# Patient Record
Sex: Female | Born: 1935
Health system: Southern US, Community
[De-identification: ages and names within clinical notes are randomized; demographics above are authoritative.]

## PROBLEM LIST (undated history)

## (undated) DIAGNOSIS — N189 Chronic kidney disease, unspecified: Principal | ICD-10-CM

## (undated) DIAGNOSIS — H269 Unspecified cataract: Secondary | ICD-10-CM

## (undated) DIAGNOSIS — D509 Iron deficiency anemia, unspecified: Secondary | ICD-10-CM

## (undated) DIAGNOSIS — D631 Anemia in chronic kidney disease: Secondary | ICD-10-CM

## (undated) HISTORY — DX: Chronic kidney disease, unspecified: N18.9

## (undated) HISTORY — DX: Anemia in chronic kidney disease: D63.1

## (undated) HISTORY — DX: Unspecified cataract: H26.9

## (undated) HISTORY — PX: APPENDECTOMY: SHX54

## (undated) HISTORY — PX: ABDOMINAL HYSTERECTOMY: SHX81

## (undated) HISTORY — DX: Iron deficiency anemia, unspecified: D50.9

---

## 1998-06-11 ENCOUNTER — Other Ambulatory Visit: Admission: RE | Admit: 1998-06-11 | Discharge: 1998-06-11 | Payer: Self-pay | Admitting: Family Medicine

## 1998-06-18 ENCOUNTER — Ambulatory Visit (HOSPITAL_COMMUNITY): Admission: RE | Admit: 1998-06-18 | Discharge: 1998-06-18 | Payer: Self-pay | Admitting: Family Medicine

## 1998-07-02 ENCOUNTER — Ambulatory Visit (HOSPITAL_COMMUNITY): Admission: RE | Admit: 1998-07-02 | Discharge: 1998-07-02 | Payer: Self-pay | Admitting: Family Medicine

## 1998-09-27 ENCOUNTER — Ambulatory Visit (HOSPITAL_COMMUNITY): Admission: RE | Admit: 1998-09-27 | Discharge: 1998-09-27 | Payer: Self-pay | Admitting: Family Medicine

## 1998-09-27 ENCOUNTER — Encounter: Payer: Self-pay | Admitting: Family Medicine

## 1999-06-11 ENCOUNTER — Ambulatory Visit (HOSPITAL_COMMUNITY): Admission: RE | Admit: 1999-06-11 | Discharge: 1999-06-11 | Payer: Self-pay | Admitting: Family Medicine

## 1999-06-11 ENCOUNTER — Encounter: Payer: Self-pay | Admitting: Family Medicine

## 1999-08-21 ENCOUNTER — Other Ambulatory Visit: Admission: RE | Admit: 1999-08-21 | Discharge: 1999-08-21 | Payer: Self-pay | Admitting: Family Medicine

## 2000-09-22 ENCOUNTER — Ambulatory Visit (HOSPITAL_COMMUNITY): Admission: RE | Admit: 2000-09-22 | Discharge: 2000-09-22 | Payer: Self-pay | Admitting: Family Medicine

## 2001-06-16 ENCOUNTER — Encounter: Payer: Self-pay | Admitting: Family Medicine

## 2001-06-16 ENCOUNTER — Ambulatory Visit (HOSPITAL_COMMUNITY): Admission: RE | Admit: 2001-06-16 | Discharge: 2001-06-16 | Payer: Self-pay | Admitting: Family Medicine

## 2002-02-21 ENCOUNTER — Encounter (INDEPENDENT_AMBULATORY_CARE_PROVIDER_SITE_OTHER): Payer: Self-pay | Admitting: Specialist

## 2002-02-21 ENCOUNTER — Ambulatory Visit (HOSPITAL_COMMUNITY): Admission: RE | Admit: 2002-02-21 | Discharge: 2002-02-21 | Payer: Self-pay | Admitting: Hematology & Oncology

## 2002-02-28 ENCOUNTER — Other Ambulatory Visit: Admission: RE | Admit: 2002-02-28 | Discharge: 2002-02-28 | Payer: Self-pay | Admitting: Family Medicine

## 2003-03-12 ENCOUNTER — Encounter: Payer: Self-pay | Admitting: Family Medicine

## 2003-03-12 ENCOUNTER — Ambulatory Visit (HOSPITAL_COMMUNITY): Admission: RE | Admit: 2003-03-12 | Discharge: 2003-03-12 | Payer: Self-pay | Admitting: Family Medicine

## 2003-06-12 ENCOUNTER — Encounter: Payer: Self-pay | Admitting: Family Medicine

## 2003-06-12 ENCOUNTER — Ambulatory Visit (HOSPITAL_COMMUNITY): Admission: RE | Admit: 2003-06-12 | Discharge: 2003-06-12 | Payer: Self-pay | Admitting: Family Medicine

## 2004-01-06 ENCOUNTER — Ambulatory Visit (HOSPITAL_COMMUNITY): Admission: RE | Admit: 2004-01-06 | Discharge: 2004-01-06 | Payer: Self-pay | Admitting: Family Medicine

## 2004-01-11 ENCOUNTER — Ambulatory Visit (HOSPITAL_COMMUNITY): Admission: RE | Admit: 2004-01-11 | Discharge: 2004-01-11 | Payer: Self-pay | Admitting: Nurse Practitioner

## 2004-02-05 ENCOUNTER — Ambulatory Visit (HOSPITAL_COMMUNITY): Admission: RE | Admit: 2004-02-05 | Discharge: 2004-02-05 | Payer: Self-pay | Admitting: Family Medicine

## 2004-03-26 ENCOUNTER — Ambulatory Visit (HOSPITAL_COMMUNITY): Admission: RE | Admit: 2004-03-26 | Discharge: 2004-03-26 | Payer: Self-pay | Admitting: Family Medicine

## 2004-09-26 ENCOUNTER — Ambulatory Visit: Payer: Self-pay | Admitting: Family Medicine

## 2004-10-06 ENCOUNTER — Ambulatory Visit: Payer: Self-pay | Admitting: Family Medicine

## 2004-12-26 ENCOUNTER — Ambulatory Visit: Payer: Self-pay | Admitting: Hematology & Oncology

## 2005-02-19 ENCOUNTER — Ambulatory Visit: Payer: Self-pay | Admitting: Family Medicine

## 2005-05-26 ENCOUNTER — Ambulatory Visit: Payer: Self-pay | Admitting: Family Medicine

## 2005-06-16 ENCOUNTER — Ambulatory Visit: Payer: Self-pay | Admitting: Family Medicine

## 2005-06-25 ENCOUNTER — Ambulatory Visit: Payer: Self-pay | Admitting: Hematology & Oncology

## 2005-07-31 ENCOUNTER — Ambulatory Visit: Payer: Self-pay | Admitting: Family Medicine

## 2005-12-18 ENCOUNTER — Ambulatory Visit: Payer: Self-pay | Admitting: Family Medicine

## 2005-12-18 ENCOUNTER — Other Ambulatory Visit: Admission: RE | Admit: 2005-12-18 | Discharge: 2005-12-18 | Payer: Self-pay | Admitting: Family Medicine

## 2005-12-18 LAB — CONVERTED CEMR LAB: Pap Smear: NORMAL

## 2005-12-29 ENCOUNTER — Ambulatory Visit: Payer: Self-pay | Admitting: Hematology & Oncology

## 2006-01-28 ENCOUNTER — Ambulatory Visit: Payer: Self-pay | Admitting: Family Medicine

## 2006-04-12 ENCOUNTER — Ambulatory Visit: Payer: Self-pay | Admitting: Family Medicine

## 2006-04-13 ENCOUNTER — Ambulatory Visit (HOSPITAL_COMMUNITY): Admission: RE | Admit: 2006-04-13 | Discharge: 2006-04-13 | Payer: Self-pay | Admitting: Family Medicine

## 2006-06-25 ENCOUNTER — Ambulatory Visit: Payer: Self-pay | Admitting: Hematology & Oncology

## 2006-06-30 LAB — CBC WITH DIFFERENTIAL/PLATELET
BASO%: 0.5 % (ref 0.0–2.0)
EOS%: 3.2 % (ref 0.0–7.0)
MCH: 26.6 pg (ref 26.0–34.0)
MCHC: 32.6 g/dL (ref 32.0–36.0)
MCV: 81.8 fL (ref 81.0–101.0)
MONO%: 15.3 % — ABNORMAL HIGH (ref 0.0–13.0)
NEUT%: 40.5 % (ref 39.6–76.8)
RDW: 16.5 % — ABNORMAL HIGH (ref 11.3–14.5)
lymph#: 1.4 10*3/uL (ref 0.9–3.3)

## 2006-07-03 LAB — FERRITIN: Ferritin: 76 ng/mL (ref 10–291)

## 2006-07-03 LAB — DIRECT ANTIGLOBULIN TEST (NOT AT ARMC)
DAT (Complement): NEGATIVE
DAT IgG: POSITIVE

## 2006-10-14 ENCOUNTER — Ambulatory Visit: Payer: Self-pay | Admitting: Family Medicine

## 2006-12-24 ENCOUNTER — Ambulatory Visit: Payer: Self-pay | Admitting: Hematology & Oncology

## 2006-12-29 LAB — CBC & DIFF AND RETIC
EOS%: 2.9 % (ref 0.0–7.0)
IRF: 0.36 — ABNORMAL HIGH (ref 0.130–0.330)
MCH: 26.8 pg (ref 26.0–34.0)
MCV: 82.8 fL (ref 81.0–101.0)
MONO%: 11 % (ref 0.0–13.0)
NEUT#: 1.7 10*3/uL (ref 1.5–6.5)
RBC: 4.09 10*6/uL (ref 3.70–5.32)
RDW: 16.5 % — ABNORMAL HIGH (ref 11.3–14.5)
RETIC #: 29.9 10*3/uL (ref 19.7–115.1)
Retic %: 0.7 % (ref 0.4–2.3)
lymph#: 1.8 10*3/uL (ref 0.9–3.3)

## 2006-12-29 LAB — IRON AND TIBC: %SAT: 20 % (ref 20–55)

## 2007-03-25 ENCOUNTER — Inpatient Hospital Stay (HOSPITAL_BASED_OUTPATIENT_CLINIC_OR_DEPARTMENT_OTHER): Admission: RE | Admit: 2007-03-25 | Discharge: 2007-03-25 | Payer: Self-pay | Admitting: Cardiology

## 2007-06-27 ENCOUNTER — Ambulatory Visit: Payer: Self-pay | Admitting: Hematology & Oncology

## 2007-06-29 LAB — CBC WITH DIFFERENTIAL/PLATELET
BASO%: 0.8 % (ref 0.0–2.0)
Basophils Absolute: 0 10*3/uL (ref 0.0–0.1)
Eosinophils Absolute: 0.1 10*3/uL (ref 0.0–0.5)
HCT: 30.4 % — ABNORMAL LOW (ref 34.8–46.6)
HGB: 10 g/dL — ABNORMAL LOW (ref 11.6–15.9)
MONO#: 0.6 10*3/uL (ref 0.1–0.9)
NEUT#: 1.5 10*3/uL (ref 1.5–6.5)
NEUT%: 37.7 % — ABNORMAL LOW (ref 39.6–76.8)
WBC: 4.1 10*3/uL (ref 3.9–10.0)
lymph#: 1.8 10*3/uL (ref 0.9–3.3)

## 2007-07-20 ENCOUNTER — Encounter (INDEPENDENT_AMBULATORY_CARE_PROVIDER_SITE_OTHER): Payer: Self-pay | Admitting: Family Medicine

## 2007-07-20 DIAGNOSIS — M949 Disorder of cartilage, unspecified: Secondary | ICD-10-CM

## 2007-07-20 DIAGNOSIS — J309 Allergic rhinitis, unspecified: Secondary | ICD-10-CM | POA: Insufficient documentation

## 2007-07-20 DIAGNOSIS — M899 Disorder of bone, unspecified: Secondary | ICD-10-CM | POA: Insufficient documentation

## 2007-07-20 DIAGNOSIS — IMO0002 Reserved for concepts with insufficient information to code with codable children: Secondary | ICD-10-CM

## 2007-07-20 DIAGNOSIS — D649 Anemia, unspecified: Secondary | ICD-10-CM | POA: Insufficient documentation

## 2007-07-20 DIAGNOSIS — M26609 Unspecified temporomandibular joint disorder, unspecified side: Secondary | ICD-10-CM | POA: Insufficient documentation

## 2007-07-20 DIAGNOSIS — D61818 Other pancytopenia: Secondary | ICD-10-CM | POA: Insufficient documentation

## 2007-07-20 DIAGNOSIS — E079 Disorder of thyroid, unspecified: Secondary | ICD-10-CM | POA: Insufficient documentation

## 2007-07-20 DIAGNOSIS — I10 Essential (primary) hypertension: Secondary | ICD-10-CM | POA: Insufficient documentation

## 2007-08-03 LAB — CBC WITH DIFFERENTIAL/PLATELET
Eosinophils Absolute: 0.1 10*3/uL (ref 0.0–0.5)
HCT: 33.8 % — ABNORMAL LOW (ref 34.8–46.6)
LYMPH%: 40.4 % (ref 14.0–48.0)
MCHC: 33.4 g/dL (ref 32.0–36.0)
MCV: 82.9 fL (ref 81.0–101.0)
MONO#: 0.5 10*3/uL (ref 0.1–0.9)
MONO%: 10.7 % (ref 0.0–13.0)
NEUT#: 2 10*3/uL (ref 1.5–6.5)
NEUT%: 45.3 % (ref 39.6–76.8)
Platelets: 187 10*3/uL (ref 145–400)
RBC: 4.07 10*6/uL (ref 3.70–5.32)
WBC: 4.5 10*3/uL (ref 3.9–10.0)

## 2007-09-05 ENCOUNTER — Ambulatory Visit: Payer: Self-pay | Admitting: Hematology & Oncology

## 2007-09-07 LAB — CBC & DIFF AND RETIC
Basophils Absolute: 0 10*3/uL (ref 0.0–0.1)
Eosinophils Absolute: 0.1 10*3/uL (ref 0.0–0.5)
HGB: 10.7 g/dL — ABNORMAL LOW (ref 11.6–15.9)
IRF: 0.38 — ABNORMAL HIGH (ref 0.130–0.330)
MCV: 83.9 fL (ref 81.0–101.0)
MONO#: 0.4 10*3/uL (ref 0.1–0.9)
MONO%: 12.1 % (ref 0.0–13.0)
NEUT#: 1.6 10*3/uL (ref 1.5–6.5)
RBC: 3.78 10*6/uL (ref 3.70–5.32)
RDW: 17.9 % — ABNORMAL HIGH (ref 11.3–14.5)
RETIC #: 36.3 10*3/uL (ref 19.7–115.1)
Retic %: 1 % (ref 0.4–2.3)
WBC: 3.5 10*3/uL — ABNORMAL LOW (ref 3.9–10.0)

## 2007-09-07 LAB — FERRITIN: Ferritin: 850 ng/mL — ABNORMAL HIGH (ref 10–291)

## 2007-09-29 LAB — CBC WITH DIFFERENTIAL/PLATELET
BASO%: 0.3 % (ref 0.0–2.0)
Eosinophils Absolute: 0.1 10*3/uL (ref 0.0–0.5)
LYMPH%: 46 % (ref 14.0–48.0)
MCHC: 33.2 g/dL (ref 32.0–36.0)
MONO#: 0.4 10*3/uL (ref 0.1–0.9)
NEUT#: 1.6 10*3/uL (ref 1.5–6.5)
Platelets: 195 10*3/uL (ref 145–400)
RBC: 4.11 10*6/uL (ref 3.70–5.32)
RDW: 17.3 % — ABNORMAL HIGH (ref 11.3–14.5)
WBC: 3.9 10*3/uL (ref 3.9–10.0)
lymph#: 1.8 10*3/uL (ref 0.9–3.3)

## 2007-10-19 ENCOUNTER — Ambulatory Visit: Payer: Self-pay | Admitting: Hematology & Oncology

## 2007-10-19 LAB — CBC & DIFF AND RETIC
BASO%: 0.6 % (ref 0.0–2.0)
Eosinophils Absolute: 0.1 10*3/uL (ref 0.0–0.5)
HCT: 37.5 % (ref 34.8–46.6)
IRF: 0.25 (ref 0.130–0.330)
LYMPH%: 47.4 % (ref 14.0–48.0)
MCHC: 33.3 g/dL (ref 32.0–36.0)
MONO#: 0.3 10*3/uL (ref 0.1–0.9)
NEUT#: 1.3 10*3/uL — ABNORMAL LOW (ref 1.5–6.5)
NEUT%: 37.5 % — ABNORMAL LOW (ref 39.6–76.8)
Platelets: 187 10*3/uL (ref 145–400)
Retic %: 0.4 % (ref 0.4–2.3)
WBC: 3.4 10*3/uL — ABNORMAL LOW (ref 3.9–10.0)
lymph#: 1.6 10*3/uL (ref 0.9–3.3)

## 2007-10-21 LAB — TRANSFERRIN RECEPTOR, SOLUABLE: Transferrin Receptor, Soluble: 55.5 nmol/L

## 2007-10-21 LAB — FERRITIN: Ferritin: 441 ng/mL — ABNORMAL HIGH (ref 10–291)

## 2007-11-09 LAB — CBC WITH DIFFERENTIAL/PLATELET
Basophils Absolute: 0.1 10*3/uL (ref 0.0–0.1)
EOS%: 3.7 % (ref 0.0–7.0)
HCT: 35.3 % (ref 34.8–46.6)
HGB: 11.6 g/dL (ref 11.6–15.9)
LYMPH%: 32.6 % (ref 14.0–48.0)
MCH: 27.8 pg (ref 26.0–34.0)
MCV: 84.2 fL (ref 81.0–101.0)
MONO%: 14 % — ABNORMAL HIGH (ref 0.0–13.0)
NEUT%: 48.3 % (ref 39.6–76.8)
Platelets: 177 10*3/uL (ref 145–400)
RDW: 16.4 % — ABNORMAL HIGH (ref 11.3–14.5)

## 2007-11-29 ENCOUNTER — Ambulatory Visit: Payer: Self-pay | Admitting: Hematology & Oncology

## 2007-12-05 LAB — CBC & DIFF AND RETIC
BASO%: 1 % (ref 0.0–2.0)
LYMPH%: 43.3 % (ref 14.0–48.0)
MCH: 27.8 pg (ref 26.0–34.0)
MCHC: 33.5 g/dL (ref 32.0–36.0)
MCV: 82.9 fL (ref 81.0–101.0)
MONO%: 14.2 % — ABNORMAL HIGH (ref 0.0–13.0)
NEUT%: 37.2 % — ABNORMAL LOW (ref 39.6–76.8)
Platelets: 179 10*3/uL (ref 145–400)
RBC: 3.99 10*6/uL (ref 3.70–5.32)
Retic %: 0.5 % (ref 0.4–2.3)

## 2008-01-02 LAB — CBC & DIFF AND RETIC
EOS%: 3.5 % (ref 0.0–7.0)
MCH: 27.7 pg (ref 26.0–34.0)
MCHC: 33.5 g/dL (ref 32.0–36.0)
MCV: 82.7 fL (ref 81.0–101.0)
MONO%: 14.9 % — ABNORMAL HIGH (ref 0.0–13.0)
NEUT#: 2.1 10*3/uL (ref 1.5–6.5)
RBC: 4.42 10*6/uL (ref 3.70–5.32)
RDW: 17.7 % — ABNORMAL HIGH (ref 11.3–14.5)
RETIC #: 17.7 10*3/uL — ABNORMAL LOW (ref 19.7–115.1)
Retic %: 0.4 % (ref 0.4–2.3)

## 2008-01-02 LAB — CHCC SMEAR

## 2008-01-13 ENCOUNTER — Encounter: Admission: RE | Admit: 2008-01-13 | Discharge: 2008-01-13 | Payer: Self-pay | Admitting: Orthopedic Surgery

## 2008-01-30 ENCOUNTER — Ambulatory Visit: Payer: Self-pay | Admitting: Hematology & Oncology

## 2008-02-01 LAB — CBC WITH DIFFERENTIAL/PLATELET
Basophils Absolute: 0 10*3/uL (ref 0.0–0.1)
EOS%: 7.7 % — ABNORMAL HIGH (ref 0.0–7.0)
HCT: 32.8 % — ABNORMAL LOW (ref 34.8–46.6)
HGB: 10.9 g/dL — ABNORMAL LOW (ref 11.6–15.9)
LYMPH%: 33.8 % (ref 14.0–48.0)
MCH: 27.7 pg (ref 26.0–34.0)
MCV: 83.4 fL (ref 81.0–101.0)
MONO%: 10.8 % (ref 0.0–13.0)
NEUT%: 47.4 % (ref 39.6–76.8)
Platelets: 184 10*3/uL (ref 145–400)
lymph#: 1.8 10*3/uL (ref 0.9–3.3)

## 2008-02-01 LAB — CHCC SMEAR

## 2008-02-01 LAB — FERRITIN: Ferritin: 708 ng/mL — ABNORMAL HIGH (ref 10–291)

## 2008-03-08 ENCOUNTER — Encounter: Admission: RE | Admit: 2008-03-08 | Discharge: 2008-03-08 | Payer: Self-pay | Admitting: Orthopedic Surgery

## 2008-03-12 ENCOUNTER — Encounter: Admission: RE | Admit: 2008-03-12 | Discharge: 2008-04-28 | Payer: Self-pay | Admitting: Orthopedic Surgery

## 2008-03-26 ENCOUNTER — Ambulatory Visit: Payer: Self-pay | Admitting: Hematology & Oncology

## 2008-03-28 LAB — CBC & DIFF AND RETIC
BASO%: 0 % (ref 0.0–2.0)
EOS%: 6.6 % (ref 0.0–7.0)
HCT: 39.5 % (ref 34.8–46.6)
LYMPH%: 35.6 % (ref 14.0–48.0)
MCH: 28 pg (ref 26.0–34.0)
MCHC: 33.2 g/dL (ref 32.0–36.0)
MONO%: 9.7 % (ref 0.0–13.0)
NEUT%: 48.1 % (ref 39.6–76.8)
Platelets: 161 10*3/uL (ref 145–400)
RBC: 4.68 10*6/uL (ref 3.70–5.32)
Retic %: 0.1 % — ABNORMAL LOW (ref 0.4–2.3)

## 2008-04-25 LAB — CBC WITH DIFFERENTIAL/PLATELET
Basophils Absolute: 0 10*3/uL (ref 0.0–0.1)
EOS%: 7.4 % — ABNORMAL HIGH (ref 0.0–7.0)
Eosinophils Absolute: 0.4 10*3/uL (ref 0.0–0.5)
HGB: 11.4 g/dL — ABNORMAL LOW (ref 11.6–15.9)
LYMPH%: 41.7 % (ref 14.0–48.0)
MCH: 27.4 pg (ref 26.0–34.0)
MCV: 84.2 fL (ref 81.0–101.0)
MONO%: 12.2 % (ref 0.0–13.0)
NEUT#: 1.8 10*3/uL (ref 1.5–6.5)
Platelets: 162 10*3/uL (ref 145–400)
RBC: 4.14 10*6/uL (ref 3.70–5.32)
RDW: 17.2 % — ABNORMAL HIGH (ref 11.3–14.5)

## 2008-05-21 ENCOUNTER — Ambulatory Visit: Payer: Self-pay | Admitting: Hematology & Oncology

## 2008-05-23 LAB — CBC WITH DIFFERENTIAL/PLATELET
Basophils Absolute: 0 10*3/uL (ref 0.0–0.1)
EOS%: 4.8 % (ref 0.0–7.0)
Eosinophils Absolute: 0.2 10*3/uL (ref 0.0–0.5)
HCT: 37.2 % (ref 34.8–46.6)
HGB: 12.3 g/dL (ref 11.6–15.9)
MCH: 27.5 pg (ref 26.0–34.0)
MCV: 83.3 fL (ref 81.0–101.0)
MONO%: 13.7 % — ABNORMAL HIGH (ref 0.0–13.0)
NEUT#: 1.7 10*3/uL (ref 1.5–6.5)
NEUT%: 40.8 % (ref 39.6–76.8)
Platelets: 196 10*3/uL (ref 145–400)
RDW: 17 % — ABNORMAL HIGH (ref 11.3–14.5)

## 2008-06-19 ENCOUNTER — Ambulatory Visit: Payer: Self-pay | Admitting: Hematology & Oncology

## 2008-08-14 ENCOUNTER — Ambulatory Visit: Payer: Self-pay | Admitting: Hematology & Oncology

## 2008-08-15 LAB — CBC WITH DIFFERENTIAL (CANCER CENTER ONLY)
BASO#: 0 10*3/uL (ref 0.0–0.2)
BASO%: 0.9 % (ref 0.0–2.0)
EOS%: 4.8 % (ref 0.0–7.0)
Eosinophils Absolute: 0.2 10*3/uL (ref 0.0–0.5)
HCT: 35.2 % (ref 34.8–46.6)
HGB: 11.9 g/dL (ref 11.6–15.9)
LYMPH#: 1.6 10*3/uL (ref 0.9–3.3)
LYMPH%: 39.6 % (ref 14.0–48.0)
MCH: 27.9 pg (ref 26.0–34.0)
MCHC: 33.8 g/dL (ref 32.0–36.0)
MCV: 83 fL (ref 81–101)
MONO#: 0.6 10*3/uL (ref 0.1–0.9)
MONO%: 13.7 % — ABNORMAL HIGH (ref 0.0–13.0)
NEUT#: 1.7 10*3/uL (ref 1.5–6.5)
NEUT%: 41 % (ref 39.6–80.0)
Platelets: 225 10*3/uL (ref 145–400)
RBC: 4.27 10*6/uL (ref 3.70–5.32)
RDW: 14.7 % — ABNORMAL HIGH (ref 10.5–14.6)
WBC: 4.1 10*3/uL (ref 3.9–10.0)

## 2008-08-29 ENCOUNTER — Encounter: Admission: RE | Admit: 2008-08-29 | Discharge: 2008-08-29 | Payer: Self-pay | Admitting: Orthopedic Surgery

## 2008-09-12 LAB — CBC WITH DIFFERENTIAL (CANCER CENTER ONLY)
BASO#: 0 10*3/uL (ref 0.0–0.2)
EOS%: 5.9 % (ref 0.0–7.0)
Eosinophils Absolute: 0.2 10*3/uL (ref 0.0–0.5)
HGB: 10.7 g/dL — ABNORMAL LOW (ref 11.6–15.9)
LYMPH%: 39.4 % (ref 14.0–48.0)
MCH: 27.1 pg (ref 26.0–34.0)
MCHC: 33.3 g/dL (ref 32.0–36.0)
MCV: 81 fL (ref 81–101)
MONO%: 13.2 % — ABNORMAL HIGH (ref 0.0–13.0)
NEUT#: 1.5 10*3/uL (ref 1.5–6.5)
Platelets: 197 10*3/uL (ref 145–400)
RBC: 3.93 10*6/uL (ref 3.70–5.32)

## 2008-09-12 LAB — FERRITIN: Ferritin: 572 ng/mL — ABNORMAL HIGH (ref 10–291)

## 2008-09-12 LAB — CHCC SATELLITE - SMEAR

## 2008-09-12 LAB — RETICULOCYTES (CHCC)
RBC.: 4.02 MIL/uL (ref 3.87–5.11)
Retic Ct Pct: 0.7 % (ref 0.4–3.1)

## 2008-10-09 ENCOUNTER — Ambulatory Visit: Payer: Self-pay | Admitting: Hematology & Oncology

## 2008-10-11 LAB — CBC WITH DIFFERENTIAL (CANCER CENTER ONLY)
BASO#: 0 10*3/uL (ref 0.0–0.2)
EOS%: 0.9 % (ref 0.0–7.0)
Eosinophils Absolute: 0.1 10*3/uL (ref 0.0–0.5)
HCT: 33 % — ABNORMAL LOW (ref 34.8–46.6)
HGB: 11 g/dL — ABNORMAL LOW (ref 11.6–15.9)
MCH: 27.8 pg (ref 26.0–34.0)
MCHC: 33.3 g/dL (ref 32.0–36.0)
MONO%: 7 % (ref 0.0–13.0)
NEUT%: 73.4 % (ref 39.6–80.0)
RBC: 3.95 10*6/uL (ref 3.70–5.32)

## 2008-11-08 LAB — CBC WITH DIFFERENTIAL (CANCER CENTER ONLY)
BASO%: 0.5 % (ref 0.0–2.0)
EOS%: 4.6 % (ref 0.0–7.0)
HGB: 11.5 g/dL — ABNORMAL LOW (ref 11.6–15.9)
LYMPH#: 1.5 10*3/uL (ref 0.9–3.3)
MCH: 27.8 pg (ref 26.0–34.0)
MCHC: 33.5 g/dL (ref 32.0–36.0)
MONO%: 10.7 % (ref 0.0–13.0)
NEUT#: 2 10*3/uL (ref 1.5–6.5)
Platelets: 222 10*3/uL (ref 145–400)
RDW: 15.6 % — ABNORMAL HIGH (ref 10.5–14.6)

## 2008-12-04 ENCOUNTER — Ambulatory Visit: Payer: Self-pay | Admitting: Hematology & Oncology

## 2008-12-06 LAB — CBC WITH DIFFERENTIAL (CANCER CENTER ONLY)
BASO#: 0.1 10*3/uL (ref 0.0–0.2)
Eosinophils Absolute: 0.1 10*3/uL (ref 0.0–0.5)
HCT: 31.8 % — ABNORMAL LOW (ref 34.8–46.6)
HGB: 10.5 g/dL — ABNORMAL LOW (ref 11.6–15.9)
LYMPH#: 1.5 10*3/uL (ref 0.9–3.3)
MONO#: 1 10*3/uL — ABNORMAL HIGH (ref 0.1–0.9)
NEUT#: 7.2 10*3/uL — ABNORMAL HIGH (ref 1.5–6.5)
NEUT%: 73.3 % (ref 39.6–80.0)
RBC: 3.86 10*6/uL (ref 3.70–5.32)
WBC: 9.8 10*3/uL (ref 3.9–10.0)

## 2009-01-02 LAB — CBC WITH DIFFERENTIAL (CANCER CENTER ONLY)
BASO%: 0.5 % (ref 0.0–2.0)
HCT: 35.2 % (ref 34.8–46.6)
LYMPH#: 1.4 10*3/uL (ref 0.9–3.3)
MONO#: 0.3 10*3/uL (ref 0.1–0.9)
NEUT#: 1.7 10*3/uL (ref 1.5–6.5)
NEUT%: 47.1 % (ref 39.6–80.0)
Platelets: 197 10*3/uL (ref 145–400)
RDW: 16.5 % — ABNORMAL HIGH (ref 10.5–14.6)
WBC: 3.7 10*3/uL — ABNORMAL LOW (ref 3.9–10.0)

## 2009-01-04 LAB — FERRITIN: Ferritin: 531 ng/mL — ABNORMAL HIGH (ref 10–291)

## 2009-01-04 LAB — RETICULOCYTES (CHCC): Retic Ct Pct: 0.6 % (ref 0.4–3.1)

## 2009-01-29 ENCOUNTER — Ambulatory Visit: Payer: Self-pay | Admitting: Hematology & Oncology

## 2009-02-01 LAB — CBC WITH DIFFERENTIAL (CANCER CENTER ONLY)
BASO#: 0 10*3/uL (ref 0.0–0.2)
BASO%: 0.6 % (ref 0.0–2.0)
EOS%: 5.2 % (ref 0.0–7.0)
HCT: 38 % (ref 34.8–46.6)
LYMPH#: 1.5 10*3/uL (ref 0.9–3.3)
LYMPH%: 35 % (ref 14.0–48.0)
MCH: 28 pg (ref 26.0–34.0)
MCHC: 32.3 g/dL (ref 32.0–36.0)
MONO%: 10.2 % (ref 0.0–13.0)
NEUT%: 49 % (ref 39.6–80.0)
RDW: 14.4 % (ref 10.5–14.6)

## 2009-02-27 LAB — CBC WITH DIFFERENTIAL (CANCER CENTER ONLY)
BASO#: 0 10*3/uL (ref 0.0–0.2)
BASO%: 0.4 % (ref 0.0–2.0)
HCT: 37 % (ref 34.8–46.6)
HGB: 12 g/dL (ref 11.6–15.9)
LYMPH%: 30.7 % (ref 14.0–48.0)
MCHC: 32.4 g/dL (ref 32.0–36.0)
MCV: 87 fL (ref 81–101)
MONO#: 0.5 10*3/uL (ref 0.1–0.9)
NEUT%: 53.7 % (ref 39.6–80.0)
RBC: 4.25 10*6/uL (ref 3.70–5.32)
RDW: 14.2 % (ref 10.5–14.6)

## 2009-03-26 ENCOUNTER — Ambulatory Visit: Payer: Self-pay | Admitting: Hematology & Oncology

## 2009-03-29 LAB — CBC WITH DIFFERENTIAL (CANCER CENTER ONLY)
BASO#: 0.1 10*3/uL (ref 0.0–0.2)
BASO%: 0.7 % (ref 0.0–2.0)
EOS%: 0.8 % (ref 0.0–7.0)
LYMPH#: 1.4 10*3/uL (ref 0.9–3.3)
MCH: 28.1 pg (ref 26.0–34.0)
MCHC: 33.1 g/dL (ref 32.0–36.0)
MONO%: 7 % (ref 0.0–13.0)
NEUT#: 6 10*3/uL (ref 1.5–6.5)
Platelets: 256 10*3/uL (ref 145–400)
RDW: 14.4 % (ref 10.5–14.6)

## 2009-03-29 LAB — FERRITIN: Ferritin: 454 ng/mL — ABNORMAL HIGH (ref 10–291)

## 2009-04-29 ENCOUNTER — Ambulatory Visit: Payer: Self-pay | Admitting: Hematology & Oncology

## 2009-04-29 LAB — CBC WITH DIFFERENTIAL (CANCER CENTER ONLY)
BASO#: 0 10*3/uL (ref 0.0–0.2)
BASO%: 0.5 % (ref 0.0–2.0)
EOS%: 5.5 % (ref 0.0–7.0)
HCT: 33.1 % — ABNORMAL LOW (ref 34.8–46.6)
HGB: 11 g/dL — ABNORMAL LOW (ref 11.6–15.9)
LYMPH%: 35.5 % (ref 14.0–48.0)
MCH: 28.5 pg (ref 26.0–34.0)
MCHC: 33.4 g/dL (ref 32.0–36.0)
MONO%: 8.9 % (ref 0.0–13.0)
NEUT%: 49.6 % (ref 39.6–80.0)
RDW: 13.7 % (ref 10.5–14.6)

## 2009-04-29 LAB — RETICULOCYTES (CHCC)
ABS Retic: 39.5 10*3/uL (ref 19.0–186.0)
RBC.: 3.95 MIL/uL (ref 3.87–5.11)

## 2009-04-29 LAB — FERRITIN: Ferritin: 693 ng/mL — ABNORMAL HIGH (ref 10–291)

## 2009-05-31 LAB — CBC WITH DIFFERENTIAL (CANCER CENTER ONLY)
BASO#: 0 10*3/uL (ref 0.0–0.2)
EOS%: 7.2 % — ABNORMAL HIGH (ref 0.0–7.0)
Eosinophils Absolute: 0.3 10*3/uL (ref 0.0–0.5)
HCT: 37.6 % (ref 34.8–46.6)
HGB: 12 g/dL (ref 11.6–15.9)
LYMPH#: 1.5 10*3/uL (ref 0.9–3.3)
MONO#: 0.4 10*3/uL (ref 0.1–0.9)
NEUT#: 1.6 10*3/uL (ref 1.5–6.5)
NEUT%: 41.4 % (ref 39.6–80.0)
RBC: 4.43 10*6/uL (ref 3.70–5.32)
WBC: 3.9 10*3/uL (ref 3.9–10.0)

## 2009-06-04 ENCOUNTER — Ambulatory Visit: Payer: Self-pay | Admitting: Hematology & Oncology

## 2009-06-27 ENCOUNTER — Ambulatory Visit: Payer: Self-pay | Admitting: Hematology & Oncology

## 2009-06-27 LAB — CBC WITH DIFFERENTIAL/PLATELET
EOS%: 9.5 % — ABNORMAL HIGH (ref 0.0–7.0)
LYMPH%: 39.7 % (ref 14.0–49.7)
MCH: 27 pg (ref 25.1–34.0)
MCV: 83.4 fL (ref 79.5–101.0)
MONO%: 10.5 % (ref 0.0–14.0)
RBC: 4.15 10*6/uL (ref 3.70–5.45)
RDW: 15.8 % — ABNORMAL HIGH (ref 11.2–14.5)
nRBC: 0 % (ref 0–0)

## 2009-06-29 LAB — FERRITIN: Ferritin: 544 ng/mL — ABNORMAL HIGH (ref 10–291)

## 2009-07-05 ENCOUNTER — Ambulatory Visit: Payer: Self-pay | Admitting: Hematology & Oncology

## 2009-07-08 LAB — CBC WITH DIFFERENTIAL (CANCER CENTER ONLY)
BASO%: 1.1 % (ref 0.0–2.0)
EOS%: 3.3 % (ref 0.0–7.0)
LYMPH%: 41.1 % (ref 14.0–48.0)
MCHC: 32.5 g/dL (ref 32.0–36.0)
MCV: 84 fL (ref 81–101)
MONO#: 0.6 10*3/uL (ref 0.1–0.9)
Platelets: 271 10*3/uL (ref 145–400)
RDW: 15.6 % — ABNORMAL HIGH (ref 10.5–14.6)
WBC: 5 10*3/uL (ref 3.9–10.0)

## 2009-07-23 LAB — CBC WITH DIFFERENTIAL (CANCER CENTER ONLY)
BASO%: 0.4 % (ref 0.0–2.0)
EOS%: 5.5 % (ref 0.0–7.0)
HCT: 33.3 % — ABNORMAL LOW (ref 34.8–46.6)
LYMPH#: 1.3 10*3/uL (ref 0.9–3.3)
MCHC: 32.7 g/dL (ref 32.0–36.0)
MONO#: 0.4 10*3/uL (ref 0.1–0.9)
NEUT#: 2.3 10*3/uL (ref 1.5–6.5)
NEUT%: 54.1 % (ref 39.6–80.0)
Platelets: 191 10*3/uL (ref 145–400)
RDW: 15.3 % — ABNORMAL HIGH (ref 10.5–14.6)
WBC: 4.3 10*3/uL (ref 3.9–10.0)

## 2009-08-19 ENCOUNTER — Ambulatory Visit: Payer: Self-pay | Admitting: Hematology & Oncology

## 2009-09-09 LAB — CBC WITH DIFFERENTIAL (CANCER CENTER ONLY)
BASO%: 0.5 % (ref 0.0–2.0)
Eosinophils Absolute: 0.2 10*3/uL (ref 0.0–0.5)
LYMPH%: 36.3 % (ref 14.0–48.0)
MCH: 27.3 pg (ref 26.0–34.0)
MCV: 82 fL (ref 81–101)
MONO%: 9.5 % (ref 0.0–13.0)
NEUT%: 48.1 % (ref 39.6–80.0)
Platelets: 203 10*3/uL (ref 145–400)
RDW: 15.6 % — ABNORMAL HIGH (ref 10.5–14.6)

## 2009-09-09 LAB — RETICULOCYTES (CHCC)
ABS Retic: 24.1 10*3/uL (ref 19.0–186.0)
RBC.: 4.01 MIL/uL (ref 3.87–5.11)
Retic Ct Pct: 0.6 % (ref 0.4–3.1)

## 2009-12-25 ENCOUNTER — Ambulatory Visit: Payer: Self-pay | Admitting: Hematology & Oncology

## 2009-12-26 LAB — CBC WITH DIFFERENTIAL (CANCER CENTER ONLY)
BASO%: 0.7 % (ref 0.0–2.0)
EOS%: 5 % (ref 0.0–7.0)
HCT: 37.1 % (ref 34.8–46.6)
MCV: 84 fL (ref 81–101)
MONO#: 0.4 10*3/uL (ref 0.1–0.9)
NEUT#: 1.7 10*3/uL (ref 1.5–6.5)
NEUT%: 44 % (ref 39.6–80.0)
Platelets: 174 10*3/uL (ref 145–400)
WBC: 3.9 10*3/uL (ref 3.9–10.0)

## 2009-12-27 LAB — RETICULOCYTES (CHCC)
ABS Retic: 34.3 10*3/uL (ref 19.0–186.0)
RBC.: 4.29 MIL/uL (ref 3.87–5.11)

## 2009-12-27 LAB — DIRECT ANTIGLOBULIN TEST (NOT AT ARMC)
DAT (Complement): NEGATIVE
DAT IgG: NEGATIVE

## 2010-02-26 ENCOUNTER — Encounter: Admission: RE | Admit: 2010-02-26 | Discharge: 2010-02-26 | Payer: Self-pay | Admitting: Orthopedic Surgery

## 2010-04-22 ENCOUNTER — Ambulatory Visit: Payer: Self-pay | Admitting: Hematology & Oncology

## 2010-04-24 LAB — CBC WITH DIFFERENTIAL (CANCER CENTER ONLY)
BASO#: 0 10*3/uL (ref 0.0–0.2)
LYMPH%: 35.6 % (ref 14.0–48.0)
MCHC: 33.3 g/dL (ref 32.0–36.0)
MCV: 85 fL (ref 81–101)
MONO#: 0.3 10*3/uL (ref 0.1–0.9)
MONO%: 9.1 % (ref 0.0–13.0)
Platelets: 181 10*3/uL (ref 145–400)
RBC: 4.03 10*6/uL (ref 3.70–5.32)
WBC: 3.4 10*3/uL — ABNORMAL LOW (ref 3.9–10.0)

## 2010-04-24 LAB — RETICULOCYTES (CHCC)
ABS Retic: 32.8 10*3/uL (ref 19.0–186.0)
Retic Ct Pct: 0.8 % (ref 0.4–3.1)

## 2010-04-24 LAB — FERRITIN: Ferritin: 476 ng/mL — ABNORMAL HIGH (ref 10–291)

## 2010-06-23 ENCOUNTER — Ambulatory Visit: Payer: Self-pay | Admitting: Hematology & Oncology

## 2010-07-17 LAB — CBC WITH DIFFERENTIAL (CANCER CENTER ONLY)
BASO#: 0 10*3/uL (ref 0.0–0.2)
BASO%: 0.5 % (ref 0.0–2.0)
EOS%: 5.1 % (ref 0.0–7.0)
Eosinophils Absolute: 0.2 10*3/uL (ref 0.0–0.5)
LYMPH#: 1.5 10*3/uL (ref 0.9–3.3)
LYMPH%: 34.7 % (ref 14.0–48.0)
MCV: 84 fL (ref 81–101)
RBC: 3.92 10*6/uL (ref 3.70–5.32)

## 2010-07-17 LAB — CHCC SATELLITE - SMEAR

## 2010-07-17 LAB — RETICULOCYTES (CHCC)
ABS Retic: 39.3 10*3/uL (ref 19.0–186.0)
Retic Ct Pct: 1 % (ref 0.4–3.1)

## 2010-10-14 ENCOUNTER — Ambulatory Visit: Payer: Self-pay | Admitting: Hematology & Oncology

## 2010-10-17 LAB — CBC WITH DIFFERENTIAL (CANCER CENTER ONLY)
BASO%: 0.3 % (ref 0.0–2.0)
EOS%: 5.9 % (ref 0.0–7.0)
HCT: 34.2 % — ABNORMAL LOW (ref 34.8–46.6)
HGB: 11.3 g/dL — ABNORMAL LOW (ref 11.6–15.9)
LYMPH#: 1.1 10*3/uL (ref 0.9–3.3)
LYMPH%: 36.6 % (ref 14.0–48.0)
MCHC: 33 g/dL (ref 32.0–36.0)
NEUT%: 46.5 % (ref 39.6–80.0)
Platelets: 138 10*3/uL — ABNORMAL LOW (ref 145–400)
RBC: 4.08 10*6/uL (ref 3.70–5.32)

## 2010-10-17 LAB — RETICULOCYTES (CHCC)
ABS Retic: 28.6 10*3/uL (ref 19.0–186.0)
RBC.: 4.09 MIL/uL (ref 3.87–5.11)
Retic Ct Pct: 0.7 % (ref 0.4–3.1)

## 2010-12-24 ENCOUNTER — Ambulatory Visit (HOSPITAL_BASED_OUTPATIENT_CLINIC_OR_DEPARTMENT_OTHER): Payer: Medicare Other | Admitting: Hematology & Oncology

## 2010-12-25 LAB — CBC WITH DIFFERENTIAL (CANCER CENTER ONLY)
BASO#: 0 10*3/uL (ref 0.0–0.2)
BASO%: 0.5 % (ref 0.0–2.0)
EOS%: 0.5 % (ref 0.0–7.0)
Eosinophils Absolute: 0 10*3/uL (ref 0.0–0.5)
HCT: 32 % — ABNORMAL LOW (ref 34.8–46.6)
HGB: 10.5 g/dL — ABNORMAL LOW (ref 11.6–15.9)
LYMPH#: 1.2 10*3/uL (ref 0.9–3.3)
LYMPH%: 17 % (ref 14.0–48.0)
MCH: 27.1 pg (ref 26.0–34.0)
MCHC: 32.8 g/dL (ref 32.0–36.0)
MCV: 83 fL (ref 81–101)
MONO#: 0.7 10*3/uL (ref 0.1–0.9)
MONO%: 10 % (ref 0.0–13.0)
NEUT#: 5.2 10*3/uL (ref 1.5–6.5)
NEUT%: 72 % (ref 39.6–80.0)
Platelets: 272 10*3/uL (ref 145–400)
RBC: 3.87 10*6/uL (ref 3.70–5.32)
RDW: 14.6 % (ref 10.5–14.6)
WBC: 7.2 10*3/uL (ref 3.9–10.0)

## 2010-12-25 LAB — VITAMIN B12: Vitamin B-12: 977 pg/mL — ABNORMAL HIGH (ref 211–911)

## 2010-12-25 LAB — CHCC SATELLITE - SMEAR

## 2010-12-25 LAB — FERRITIN: Ferritin: 396 ng/mL — ABNORMAL HIGH (ref 10–291)

## 2010-12-25 LAB — LACTATE DEHYDROGENASE: LDH: 172 U/L (ref 94–250)

## 2010-12-28 ENCOUNTER — Encounter: Payer: Self-pay | Admitting: Family Medicine

## 2010-12-30 ENCOUNTER — Encounter
Admission: RE | Admit: 2010-12-30 | Discharge: 2010-12-30 | Payer: Self-pay | Source: Home / Self Care | Attending: Orthopedic Surgery | Admitting: Orthopedic Surgery

## 2011-01-27 ENCOUNTER — Other Ambulatory Visit: Payer: Self-pay | Admitting: Orthopedic Surgery

## 2011-01-27 DIAGNOSIS — R609 Edema, unspecified: Secondary | ICD-10-CM

## 2011-01-27 DIAGNOSIS — M79605 Pain in left leg: Secondary | ICD-10-CM

## 2011-01-28 ENCOUNTER — Ambulatory Visit
Admission: RE | Admit: 2011-01-28 | Discharge: 2011-01-28 | Disposition: A | Discharge status: Home / Self Care | Payer: Self-pay | Source: Ambulatory Visit | Attending: Orthopedic Surgery | Admitting: Orthopedic Surgery

## 2011-01-28 DIAGNOSIS — R609 Edema, unspecified: Secondary | ICD-10-CM

## 2011-01-28 DIAGNOSIS — M79605 Pain in left leg: Secondary | ICD-10-CM

## 2011-02-23 ENCOUNTER — Encounter (HOSPITAL_BASED_OUTPATIENT_CLINIC_OR_DEPARTMENT_OTHER): Payer: Medicare Other | Admitting: Hematology & Oncology

## 2011-02-23 DIAGNOSIS — N189 Chronic kidney disease, unspecified: Secondary | ICD-10-CM

## 2011-02-23 DIAGNOSIS — D649 Anemia, unspecified: Secondary | ICD-10-CM

## 2011-02-23 DIAGNOSIS — N289 Disorder of kidney and ureter, unspecified: Secondary | ICD-10-CM

## 2011-02-23 DIAGNOSIS — D631 Anemia in chronic kidney disease: Secondary | ICD-10-CM

## 2011-02-23 LAB — CBC WITH DIFFERENTIAL (CANCER CENTER ONLY)
BASO#: 0 10*3/uL (ref 0.0–0.2)
EOS%: 5.3 % (ref 0.0–7.0)
Eosinophils Absolute: 0.2 10*3/uL (ref 0.0–0.5)
HCT: 31.5 % — ABNORMAL LOW (ref 34.8–46.6)
MCH: 26.3 pg (ref 26.0–34.0)
MCHC: 32.1 g/dL (ref 32.0–36.0)
MCV: 82 fL (ref 81–101)
MONO%: 15.4 % — ABNORMAL HIGH (ref 0.0–13.0)
NEUT#: 1.8 10*3/uL (ref 1.5–6.5)
NEUT%: 41.8 % (ref 39.6–80.0)
Platelets: 173 10*3/uL (ref 145–400)

## 2011-03-30 ENCOUNTER — Other Ambulatory Visit: Payer: Self-pay | Admitting: Hematology & Oncology

## 2011-03-30 ENCOUNTER — Encounter (HOSPITAL_BASED_OUTPATIENT_CLINIC_OR_DEPARTMENT_OTHER): Payer: Medicare Other | Admitting: Hematology & Oncology

## 2011-03-30 DIAGNOSIS — D631 Anemia in chronic kidney disease: Secondary | ICD-10-CM

## 2011-03-30 DIAGNOSIS — D649 Anemia, unspecified: Secondary | ICD-10-CM

## 2011-03-30 DIAGNOSIS — N189 Chronic kidney disease, unspecified: Secondary | ICD-10-CM

## 2011-03-30 DIAGNOSIS — N289 Disorder of kidney and ureter, unspecified: Secondary | ICD-10-CM

## 2011-03-30 LAB — IRON AND TIBC
Iron: 52 ug/dL (ref 42–145)
TIBC: 277 ug/dL (ref 250–470)
UIBC: 225 ug/dL

## 2011-03-30 LAB — CBC WITH DIFFERENTIAL (CANCER CENTER ONLY)
BASO%: 1.5 % (ref 0.0–2.0)
EOS%: 5.8 % (ref 0.0–7.0)
HGB: 10.7 g/dL — ABNORMAL LOW (ref 11.6–15.9)
LYMPH#: 1.3 10*3/uL (ref 0.9–3.3)
MONO#: 0.5 10*3/uL (ref 0.1–0.9)
MONO%: 12.8 % (ref 0.0–13.0)
NEUT#: 1.9 10*3/uL (ref 1.5–6.5)
NEUT%: 47.9 % (ref 39.6–80.0)
Platelets: 185 10*3/uL (ref 145–400)
RBC: 4.14 10*6/uL (ref 3.70–5.32)

## 2011-03-30 LAB — RETICULOCYTES (CHCC)
RBC.: 4.25 MIL/uL (ref 3.87–5.11)
Retic Ct Pct: 0.6 % (ref 0.4–3.1)

## 2011-03-30 LAB — CHCC SATELLITE - SMEAR

## 2011-04-24 NOTE — Procedures (Signed)
[facility]  Patient:    Brittany Weber, Brittany Weber Visit Number: [id number] MRN: [id number]          Service Type: OUT Location: OMED Attending Physician:  Jim Desanctis Dictated by:   Arlan Organ, M.D. Proc. Date: 02/21/02 Admit Date:  02/21/2002                             Procedure Report  PROCEDURE:  Left posterior iliac crest bone graft biopsy and aspirate.  DESCRIPTION OF PROCEDURE:  Ms. Parzych was brought to the Short Stay Unit. She is a 77 year old black female with mild pancytopenia. We went ahead and put her onto her right side. The left posterior iliac crest region was prepped and draped in a sterile fashion.  She received a total of 400 mg of Versed IV for sedation.  1% lidocaine was infiltrated under the skin down to the periosteum. A #11 scalpel was used to make an incision. An attempt to aspirate the marrow was made x 3.  Unfortunately all three attempts were "dry".  A second incision was made into the skin. Two bone marrow biopsy cores were obtained without difficulties. One of the samples was send for flow cytometry and cytogenetics.  The patient tolerated the procedure well. There were no complications. She had stable vital signs throughout the procedure. Dictated by:   Arlan Organ, M.D. Attending Physician:  Jim Desanctis DD:  02/21/02 TD:  02/21/02 Job: [id number]

## 2011-04-24 NOTE — Cardiovascular Report (Signed)
NAME:  Brittany Weber, Brittany Weber NO.:  [id number]   MEDICAL RECORD NO.:  [id number]          PATIENT TYPE:  OIB   LOCATION:  1961                         FACILITY:  [facility]   PHYSICIAN:  Mohan N. Sharyn Lull, M.D. DATE OF BIRTH:  December 25, 1931   DATE OF PROCEDURE:  03/25/2007  DATE OF DISCHARGE:                            CARDIAC CATHETERIZATION   PROCEDURE:  Left cardiac catheterization with selective left and right  coronary angiography, left ventriculography, via right groin using  Judkins technique.   INDICATIONS FOR PROCEDURE:  Brittany Weber is a 77 year old black female  with a past medical history significant for hypertension, degenerative  joint disease, allergic rhinitis, fibromyalgia.  She came to the office  complaining of retrosternal chest pain radiating to the right arm,  associated with shortness of breath off and on.  Also complains of  exertional dyspnea associated with feeling weak and tired.  Denies any  nausea, vomiting, diaphoresis.  Denies PND, orthopnea, leg swelling.  Denies palpitation, lightheadedness or syncope.  Denies relation of  chest pain to food, breathing or movement.  EKG done in the office  showed normal sinus rhythm with LVH with T-wave inversion in the  anterolateral leads.  The patient states prior EKG has always been  normal.   PAST MEDICAL HISTORY:  As above.   PAST SURGICAL HISTORY:  She had tonsillectomy at the age of 69, had  appendectomy at the age of 56.  Had hysterectomy at age of 32.  Hemorrhoidectomy at the age of 77.   ALLERGIES:  She is allergic to QUESTIONABLE PAIN MEDICATION.   MEDICATIONS AT HOME:  1. Norvasc 10 mg p.o. daily.  2. Folic acid one tablet daily.  3. Enteric-coated aspirin 81 mg p.o. daily.  4. Sublingual nitro p.r.n.   SOCIAL HISTORY:  She is single and has one son.  No history of smoking  or alcohol abuse.  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   FAMILY HISTORY:   Positive for coronary artery disease.   PHYSICAL EXAMINATION:  GENERAL:  On examination, she is alert, awake,  oriented x3 in no acute distress.  VITAL SIGNS:  Blood pressure was 160/84, pulse of 60 and regular.  HEENT:  Conjunctivae were pink.  NECK:  Supple.  No JVD, no bruit.  LUNGS:  Clear to auscultation without rhonchi or rales.  CARDIOVASCULAR:  S1, S2 was normal.  There was a soft systolic murmur.  There was no diastolic murmur.  No S3 gallop or rub.  ABDOMEN:  Soft, bowel sounds were present, nontender.  EXTREMITIES:  There was no clubbing, cyanosis or edema.   EKG showed normal sinus rhythm, LVH with T-wave inversion in  anterolateral leads.   IMPRESSION:  1. New-onset angina.  2. Uncontrolled hypertension.  3. Degenerative joint disease.  4. Positive family history of coronary artery disease.  5. Allergic rhinitis.  6. Fibromyalgia.   Discussed with the patient regarding left catheterization, its risks and  benefits, i.e. death, MI, stroke, need for emergency CABG, risk of  restenosis, local vascular complications, etc., And consented for left  catheterization.   PROCEDURE:  After obtaining informed consent, the patient was brought to  the catheterization lab and was placed on fluoroscopy table.  The right  groin was prepped and draped in the usual fashion.  Xylocaine 2% was  used for local anesthesia in the right groin.  With the help of thin-  wall needle, a 4-French arterial sheath was placed.  The sheath was  aspirated and flushed.  Next, 4-French left Judkins catheter was  advanced over the wire under fluoroscopic guidance up to the ascending  aorta.  Wire was pulled out.  The catheter was aspirated and connected  to the manifold.  Catheter was further advanced and engaged into left  coronary ostium.  Multiple views of the left system were taken.  Next  the catheter was disengaged and was pulled out over the wire and was  replaced with 4-French right Judkins  catheter, which was advanced over  the wire under fluoroscopic guidance up to the ascending aorta.  Wire  was pulled out.  The catheter was aspirated and connected to the  manifold.  Catheter was further advanced and engaged into right coronary  ostium.  Multiple views of the right system were taken.  Next the  catheter was disengaged and was pulled out over the wire and was  replaced with a 4-French pigtail catheter, which was advanced over the  wire under fluoroscopic guidance up to the ascending aorta.  Wire was  pulled.  Out the catheter was aspirated and connected to the manifold.  Catheter was further advanced across the aortic valve into the LV.  LV  pressures were recorded.  Next left ventriculography was done in 30-  degree RAO position.  Post angiographic pressures were recorded from LV  and then pullback pressures were recorded from the aorta.  There was no  gradient across the aortic valve.  Next the pigtail catheter was pulled  out over the wire.  Sheaths were aspirated and flushed.   FINDINGS:  LV showed good LV systolic function, LVH, EF of 78-46%.  Left  main was patent.  LAD was patent.  Diagonal #1 was very small, which has  20-30% proximal stenosis.  Vessel is less than 1.5 mm.  Diagonal #2 was  small, which has 15-20% ostial stenosis.  Diagonal #3 was very small,  which was patent.  Left circumflex was patent.  OM-1 very, very small.  OM-2 was small, which was patent.  OM-3 was moderate-sized, which was  patent.  RCA was patent.  PDA was moderate-sized, which was patent.  The  patient tolerated the procedure well.  There were no complications.  The  patient was transferred to recovery room in stable condition.           ______________________________  Eduardo Osier Sharyn Lull, M.D.     MNH/MEDQ  D:  03/25/2007  T:  03/25/2007  Job:  [id number]   cc:   Cardiac Catheterization Lab

## 2011-04-29 ENCOUNTER — Other Ambulatory Visit: Payer: Self-pay | Admitting: Hematology & Oncology

## 2011-04-29 ENCOUNTER — Encounter (HOSPITAL_BASED_OUTPATIENT_CLINIC_OR_DEPARTMENT_OTHER): Payer: Medicare Other | Admitting: Hematology & Oncology

## 2011-04-29 DIAGNOSIS — M169 Osteoarthritis of hip, unspecified: Secondary | ICD-10-CM

## 2011-04-29 DIAGNOSIS — N289 Disorder of kidney and ureter, unspecified: Secondary | ICD-10-CM

## 2011-04-29 DIAGNOSIS — D649 Anemia, unspecified: Secondary | ICD-10-CM

## 2011-04-29 LAB — CBC WITH DIFFERENTIAL (CANCER CENTER ONLY)
BASO%: 0.9 % (ref 0.0–2.0)
EOS%: 5.4 % (ref 0.0–7.0)
HCT: 36.8 % (ref 34.8–46.6)
LYMPH%: 30.4 % (ref 14.0–48.0)
MCH: 26.3 pg (ref 26.0–34.0)
MCHC: 32.1 g/dL (ref 32.0–36.0)
MCV: 82 fL (ref 81–101)
MONO%: 14.4 % — ABNORMAL HIGH (ref 0.0–13.0)
NEUT%: 48.9 % (ref 39.6–80.0)
Platelets: 160 10*3/uL (ref 145–400)
RDW: 18.5 % — ABNORMAL HIGH (ref 11.1–15.7)

## 2011-04-30 LAB — FERRITIN: Ferritin: 788 ng/mL — ABNORMAL HIGH (ref 10–291)

## 2011-04-30 LAB — IRON AND TIBC
%SAT: 23 % (ref 20–55)
Iron: 61 ug/dL (ref 42–145)
TIBC: 260 ug/dL (ref 250–470)
UIBC: 199 ug/dL

## 2011-04-30 LAB — RETICULOCYTES (CHCC)
ABS Retic: 18.2 10*3/uL — ABNORMAL LOW (ref 19.0–186.0)
RBC.: 4.56 MIL/uL (ref 3.87–5.11)
Retic Ct Pct: 0.4 % (ref 0.4–3.1)

## 2011-06-18 ENCOUNTER — Encounter (HOSPITAL_BASED_OUTPATIENT_CLINIC_OR_DEPARTMENT_OTHER): Payer: Medicare Other | Admitting: Hematology & Oncology

## 2011-06-18 ENCOUNTER — Other Ambulatory Visit: Payer: Self-pay | Admitting: Hematology & Oncology

## 2011-06-18 DIAGNOSIS — N289 Disorder of kidney and ureter, unspecified: Secondary | ICD-10-CM

## 2011-06-18 DIAGNOSIS — D649 Anemia, unspecified: Secondary | ICD-10-CM

## 2011-06-18 DIAGNOSIS — M1610 Unilateral primary osteoarthritis, unspecified hip: Secondary | ICD-10-CM

## 2011-06-18 DIAGNOSIS — D591 Autoimmune hemolytic anemia, unspecified: Secondary | ICD-10-CM

## 2011-06-18 DIAGNOSIS — N189 Chronic kidney disease, unspecified: Secondary | ICD-10-CM

## 2011-06-18 DIAGNOSIS — M169 Osteoarthritis of hip, unspecified: Secondary | ICD-10-CM

## 2011-06-18 DIAGNOSIS — D631 Anemia in chronic kidney disease: Secondary | ICD-10-CM

## 2011-06-18 LAB — RETICULOCYTES (CHCC): ABS Retic: 24.3 10*3/uL (ref 19.0–186.0)

## 2011-06-18 LAB — IRON AND TIBC
%SAT: 25 % (ref 20–55)
Iron: 68 ug/dL (ref 42–145)
TIBC: 270 ug/dL (ref 250–470)

## 2011-06-18 LAB — CBC WITH DIFFERENTIAL (CANCER CENTER ONLY)
BASO%: 1.3 % (ref 0.0–2.0)
LYMPH%: 31.5 % (ref 14.0–48.0)
MCV: 83 fL (ref 81–101)
MONO#: 0.7 10*3/uL (ref 0.1–0.9)
Platelets: 118 10*3/uL — ABNORMAL LOW (ref 145–400)
RDW: 18.5 % — ABNORMAL HIGH (ref 11.1–15.7)
WBC: 3.8 10*3/uL — ABNORMAL LOW (ref 3.9–10.0)

## 2011-06-18 LAB — FERRITIN: Ferritin: 791 ng/mL — ABNORMAL HIGH (ref 10–291)

## 2011-06-18 LAB — CHCC SATELLITE - SMEAR

## 2011-07-23 ENCOUNTER — Other Ambulatory Visit: Payer: Self-pay | Admitting: Hematology & Oncology

## 2011-07-23 ENCOUNTER — Encounter (HOSPITAL_BASED_OUTPATIENT_CLINIC_OR_DEPARTMENT_OTHER): Payer: Medicare Other | Admitting: Hematology & Oncology

## 2011-07-23 DIAGNOSIS — D649 Anemia, unspecified: Secondary | ICD-10-CM

## 2011-07-23 DIAGNOSIS — M169 Osteoarthritis of hip, unspecified: Secondary | ICD-10-CM

## 2011-07-23 DIAGNOSIS — D631 Anemia in chronic kidney disease: Secondary | ICD-10-CM

## 2011-07-23 DIAGNOSIS — N189 Chronic kidney disease, unspecified: Secondary | ICD-10-CM

## 2011-07-23 DIAGNOSIS — N289 Disorder of kidney and ureter, unspecified: Secondary | ICD-10-CM

## 2011-07-23 LAB — CBC WITH DIFFERENTIAL (CANCER CENTER ONLY)
BASO%: 0.6 % (ref 0.0–2.0)
EOS%: 3.6 % (ref 0.0–7.0)
LYMPH#: 1.3 10*3/uL (ref 0.9–3.3)
MCHC: 33.2 g/dL (ref 32.0–36.0)
NEUT#: 1.4 10*3/uL — ABNORMAL LOW (ref 1.5–6.5)
Platelets: 125 10*3/uL — ABNORMAL LOW (ref 145–400)
RDW: 17.2 % — ABNORMAL HIGH (ref 11.1–15.7)

## 2011-07-24 LAB — IRON AND TIBC
TIBC: 283 ug/dL (ref 250–470)
UIBC: 209 ug/dL

## 2011-07-24 LAB — RETICULOCYTES (CHCC): ABS Retic: 42.9 10*3/uL (ref 19.0–186.0)

## 2011-07-24 LAB — FERRITIN: Ferritin: 755 ng/mL — ABNORMAL HIGH (ref 10–291)

## 2011-07-24 LAB — DIRECT ANTIGLOBULIN TEST (NOT AT ARMC): DAT (Complement): NEGATIVE

## 2011-08-21 ENCOUNTER — Other Ambulatory Visit: Payer: Self-pay | Admitting: Orthopedic Surgery

## 2011-08-21 ENCOUNTER — Ambulatory Visit
Admission: RE | Admit: 2011-08-21 | Discharge: 2011-08-21 | Disposition: A | Discharge status: Home / Self Care | Payer: Medicare Other | Source: Ambulatory Visit | Attending: Orthopedic Surgery | Admitting: Orthopedic Surgery

## 2011-08-21 DIAGNOSIS — M1990 Unspecified osteoarthritis, unspecified site: Secondary | ICD-10-CM

## 2011-08-27 ENCOUNTER — Other Ambulatory Visit: Payer: Self-pay | Admitting: Family

## 2011-08-27 ENCOUNTER — Encounter (HOSPITAL_BASED_OUTPATIENT_CLINIC_OR_DEPARTMENT_OTHER): Payer: Medicare Other | Admitting: Hematology & Oncology

## 2011-08-27 DIAGNOSIS — D649 Anemia, unspecified: Secondary | ICD-10-CM

## 2011-08-27 DIAGNOSIS — M1610 Unilateral primary osteoarthritis, unspecified hip: Secondary | ICD-10-CM

## 2011-08-27 DIAGNOSIS — M169 Osteoarthritis of hip, unspecified: Secondary | ICD-10-CM

## 2011-08-27 DIAGNOSIS — N289 Disorder of kidney and ureter, unspecified: Secondary | ICD-10-CM

## 2011-08-27 LAB — CBC WITH DIFFERENTIAL (CANCER CENTER ONLY)
BASO#: 0 10*3/uL (ref 0.0–0.2)
EOS%: 5.3 % (ref 0.0–7.0)
HCT: 34.1 % — ABNORMAL LOW (ref 34.8–46.6)
HGB: 11.2 g/dL — ABNORMAL LOW (ref 11.6–15.9)
LYMPH%: 34.7 % (ref 14.0–48.0)
MCH: 28.5 pg (ref 26.0–34.0)
MCHC: 32.8 g/dL (ref 32.0–36.0)
MCV: 87 fL (ref 81–101)
MONO%: 15 % — ABNORMAL HIGH (ref 0.0–13.0)
NEUT#: 1.4 10*3/uL — ABNORMAL LOW (ref 1.5–6.5)
NEUT%: 44.1 % (ref 39.6–80.0)

## 2011-08-31 ENCOUNTER — Ambulatory Visit (HOSPITAL_COMMUNITY): Payer: Medicare Other

## 2011-09-03 ENCOUNTER — Inpatient Hospital Stay (HOSPITAL_COMMUNITY): Admission: RE | Admit: 2011-09-03 | Payer: Medicare Other | Source: Ambulatory Visit | Admitting: Orthopedic Surgery

## 2011-09-29 ENCOUNTER — Encounter: Payer: Self-pay | Admitting: *Deleted

## 2011-10-22 ENCOUNTER — Other Ambulatory Visit (HOSPITAL_BASED_OUTPATIENT_CLINIC_OR_DEPARTMENT_OTHER): Payer: Medicare Other | Admitting: Lab

## 2011-10-22 ENCOUNTER — Other Ambulatory Visit: Payer: Self-pay | Admitting: Hematology & Oncology

## 2011-10-22 ENCOUNTER — Encounter: Payer: Self-pay | Admitting: *Deleted

## 2011-10-22 ENCOUNTER — Ambulatory Visit (HOSPITAL_BASED_OUTPATIENT_CLINIC_OR_DEPARTMENT_OTHER): Payer: Medicare Other | Admitting: Hematology & Oncology

## 2011-10-22 ENCOUNTER — Ambulatory Visit: Payer: Medicare Other | Admitting: Hematology & Oncology

## 2011-10-22 ENCOUNTER — Encounter: Payer: Self-pay | Admitting: Hematology & Oncology

## 2011-10-22 DIAGNOSIS — D509 Iron deficiency anemia, unspecified: Secondary | ICD-10-CM | POA: Insufficient documentation

## 2011-10-22 DIAGNOSIS — D649 Anemia, unspecified: Secondary | ICD-10-CM

## 2011-10-22 DIAGNOSIS — N189 Chronic kidney disease, unspecified: Secondary | ICD-10-CM

## 2011-10-22 DIAGNOSIS — N289 Disorder of kidney and ureter, unspecified: Secondary | ICD-10-CM

## 2011-10-22 DIAGNOSIS — D631 Anemia in chronic kidney disease: Secondary | ICD-10-CM

## 2011-10-22 HISTORY — DX: Iron deficiency anemia, unspecified: D50.9

## 2011-10-22 HISTORY — DX: Anemia in chronic kidney disease: D63.1

## 2011-10-22 HISTORY — DX: Chronic kidney disease, unspecified: N18.9

## 2011-10-22 LAB — CBC WITH DIFFERENTIAL (CANCER CENTER ONLY)
BASO%: 1.5 % (ref 0.0–2.0)
HCT: 34.2 % — ABNORMAL LOW (ref 34.8–46.6)
LYMPH%: 38.6 % (ref 14.0–48.0)
MCH: 28.2 pg (ref 26.0–34.0)
MCHC: 33 g/dL (ref 32.0–36.0)
MCV: 85 fL (ref 81–101)
MONO%: 14.3 % — ABNORMAL HIGH (ref 0.0–13.0)
NEUT%: 40.4 % (ref 39.6–80.0)
Platelets: 121 10*3/uL — ABNORMAL LOW (ref 145–400)
RDW: 15.1 % (ref 11.1–15.7)

## 2011-10-22 LAB — RETICULOCYTES (CHCC)
RBC.: 4.15 MIL/uL (ref 3.87–5.11)
Retic Ct Pct: 0.7 % (ref 0.4–2.3)
Retic Ct Pct: 0.7 % (ref 0.4–2.3)

## 2011-10-22 LAB — IRON AND TIBC
%SAT: 22 % (ref 20–55)
Iron: 65 ug/dL (ref 42–145)
UIBC: 224 ug/dL (ref 125–400)

## 2011-10-22 LAB — FERRITIN: Ferritin: 628 ng/mL — ABNORMAL HIGH (ref 10–291)

## 2011-10-22 NOTE — Progress Notes (Signed)
This office note has been dictated. [name] CSN: [id number]

## 2011-10-22 NOTE — Progress Notes (Signed)
Mailed January calender to patient

## 2011-10-23 NOTE — Progress Notes (Signed)
CC:   Brittany Neither, MD  DIAGNOSES: 1. Anemia of renal insufficiency. 2. Intermittent iron deficiency anemia.  CURRENT THERAPY: 1. Aranesp 300 mcg subcutaneously as needed for hemoglobin less than     11. 2. IV iron as indicated.  INTERIM HISTORY:  Brittany Weber comes in for follow-up.  She still is bothered by her right hip.  She keeps putting off surgery.  She says that she is not going to have any surgery until springtime.  She does not want to have surgery over the wintertime.  She has not had any bleeding.  She has had no increase in fatigue.  She has had no nausea or vomiting.  She has not noticed any leg swelling.  PHYSICAL EXAMINATION:  General Appearance:  This is an elderly appearing black female in no obvious distress.  Vital Signs:  98, pulse 68, respiratory rate 20, blood pressure 126/71.  Weight is 161.  Head and Neck Exam:  Shows a normocephalic, atraumatic skull.  There are no ocular or oral lesions.  There are no palpable cervical or supraclavicular lymph nodes.  Lungs:  Clear to percussion and auscultation bilaterally.  Cardiac Exam:  Regular rate and rhythm with a normal S1 and S2.  There are no murmurs, rubs or bruits.  Abdominal Exam:  Soft with good bowel sounds.  There is no palpable abdominal mass.  There is no fluid wave.  There is no palpable hepatosplenomegaly. Extremities:  Show no clubbing, cyanosis or edema.  LABORATORY STUDIES:  White cell count 3.3, hemoglobin 11.3, hematocrit 34.2, platelet count 121.  MCV is 85.  IMPRESSION:  Brittany Weber is a 77 year old black female with anemia of renal insufficiency.  Again, her hemoglobin is doing quite well.  She does not need any Aranesp.  We will plan to get her back after the holidays now.  Again, her main problem is this right hip.  She definitely needs to get this taken care of, but she continues to put this off.   ______________________________ [name], M.D. PRE/MEDQ  D:  10/22/2011  T:   10/23/2011  Job:  [id number]

## 2011-12-09 ENCOUNTER — Other Ambulatory Visit: Payer: Self-pay | Admitting: *Deleted

## 2011-12-09 DIAGNOSIS — D529 Folate deficiency anemia, unspecified: Secondary | ICD-10-CM

## 2011-12-09 MED ORDER — FOLIC ACID 1 MG PO TABS: 1.0000 mg | ORAL_TABLET | Freq: Two times a day (BID) | ORAL | Status: DC

## 2011-12-09 NOTE — Telephone Encounter (Signed)
Received fax from [facility] for Folic Acid refill. Reviewed with Dr Myna Hidalgo. Ok to refill as this is a long term med.

## 2011-12-22 ENCOUNTER — Telehealth: Payer: Self-pay | Admitting: Hematology & Oncology

## 2011-12-22 ENCOUNTER — Other Ambulatory Visit: Payer: Medicare Other | Admitting: Lab

## 2011-12-22 ENCOUNTER — Ambulatory Visit: Payer: Medicare Other | Admitting: Hematology & Oncology

## 2011-12-22 NOTE — Telephone Encounter (Signed)
Pt called and cx 12/22/11 apt due to feeling well.  She resch for 01/05/12.  Nurse was notified of cx apt.

## 2012-01-04 ENCOUNTER — Telehealth: Payer: Self-pay | Admitting: Hematology & Oncology

## 2012-01-04 NOTE — Telephone Encounter (Signed)
Pt called cx apt for 01/05/12 and reshc for 01/06/12

## 2012-01-05 ENCOUNTER — Other Ambulatory Visit: Payer: Medicare Other | Admitting: Lab

## 2012-01-05 ENCOUNTER — Ambulatory Visit: Payer: Medicare Other | Admitting: Hematology & Oncology

## 2012-01-06 ENCOUNTER — Other Ambulatory Visit (HOSPITAL_BASED_OUTPATIENT_CLINIC_OR_DEPARTMENT_OTHER): Payer: Medicare Other | Admitting: Lab

## 2012-01-06 ENCOUNTER — Ambulatory Visit (HOSPITAL_BASED_OUTPATIENT_CLINIC_OR_DEPARTMENT_OTHER): Payer: Medicare Other | Admitting: Hematology & Oncology

## 2012-01-06 ENCOUNTER — Ambulatory Visit (HOSPITAL_BASED_OUTPATIENT_CLINIC_OR_DEPARTMENT_OTHER): Payer: Medicare Other

## 2012-01-06 VITALS — BP 146/98 | HR 85 | Temp 97.3°F | Ht 59.0 in | Wt 164.0 lb

## 2012-01-06 DIAGNOSIS — D631 Anemia in chronic kidney disease: Secondary | ICD-10-CM

## 2012-01-06 DIAGNOSIS — M7989 Other specified soft tissue disorders: Secondary | ICD-10-CM

## 2012-01-06 DIAGNOSIS — D649 Anemia, unspecified: Secondary | ICD-10-CM | POA: Diagnosis not present

## 2012-01-06 DIAGNOSIS — D509 Iron deficiency anemia, unspecified: Secondary | ICD-10-CM

## 2012-01-06 DIAGNOSIS — N289 Disorder of kidney and ureter, unspecified: Secondary | ICD-10-CM

## 2012-01-06 DIAGNOSIS — N189 Chronic kidney disease, unspecified: Secondary | ICD-10-CM

## 2012-01-06 LAB — IRON AND TIBC
%SAT: 22 % (ref 20–55)
TIBC: 291 ug/dL (ref 250–470)

## 2012-01-06 LAB — CBC WITH DIFFERENTIAL (CANCER CENTER ONLY)
BASO#: 0 10*3/uL (ref 0.0–0.2)
BASO%: 1 % (ref 0.0–2.0)
HCT: 33 % — ABNORMAL LOW (ref 34.8–46.6)
HGB: 10.5 g/dL — ABNORMAL LOW (ref 11.6–15.9)
LYMPH#: 1.2 10*3/uL (ref 0.9–3.3)
LYMPH%: 31.2 % (ref 14.0–48.0)
MCHC: 31.8 g/dL — ABNORMAL LOW (ref 32.0–36.0)
MCV: 88 fL (ref 81–101)
MONO#: 0.6 10*3/uL (ref 0.1–0.9)
NEUT%: 48.8 % (ref 39.6–80.0)
RDW: 14.6 % (ref 11.1–15.7)
WBC: 3.9 10*3/uL (ref 3.9–10.0)

## 2012-01-06 LAB — FERRITIN: Ferritin: 656 ng/mL — ABNORMAL HIGH (ref 10–291)

## 2012-01-06 MED ORDER — DARBEPOETIN ALFA-POLYSORBATE 300 MCG/0.6ML IJ SOLN
300.0000 ug | Freq: Once | INTRAMUSCULAR | Status: AC
  Administered 2012-01-06: 300 ug via SUBCUTANEOUS

## 2012-01-06 NOTE — Progress Notes (Signed)
This office note has been dictated.

## 2012-01-07 NOTE — Progress Notes (Signed)
CC:   Brittany Neither, MD  DIAGNOSES: 1. Anemia of renal insufficiency. 2. Intermittent iron deficiency anemia. 3. Severe degenerative disease of the right him.  CURRENT THERAPY: 1. Aranesp 300 mcg subcu as needed for hemoglobin less than 11. 2. IV iron as necessary.  INTERIM HISTORY:  Brittany Weber comes in for followup. Not unexpectedly, she has yet to make an appointment for her right hip to be fixed.  She comes limping in.  She uses a rolling walker.  She last got Aranesp back in August of 2012. She last got iron back in April 2012.  She has had no change in bowel or bladder habits. There has been no change in her medications.  She enjoyed the holidays okay. She  had no problems over Thanksgiving or Christmas.  We last saw her back in November, her ferritin was 628, iron saturation was at 22 with a total iron of 65.  She has noticed some occasional swelling in her legs, again this is chronic.  She has had no cough.  She has had no dysphagia or odynophagia.  PHYSICAL EXAMINATION:  This is an elderly appearing black female in no obvious distress.  Vital signs: Temperature 97.3, pulse 85, respiratory rate 16, blood pressure 146/88.  Weight is 164.  Head and neck: Normocephalic, atraumatic skull.  There are no ocular or oral lesions. There are no palpable cervical or supraclavicular lymph nodes.  Lungs: Clear to percussion and auscultation bilaterally.  Cardiac:  Regular rate and rhythm with normal S1 and S2.  There are no murmurs, rubs or bruits.  Abdomen:  Soft with good bowel sounds.  There is no palpable abdominal mass.  There is no fluid wave.  There is no palpable hepatosplenomegaly.  Back:  No tenderness over the spine, ribs, or hips. Extremities:  Brace over her right knee.  She has some trace pitting edema in her lower legs bilaterally.  There is no palpable venous cords in lower legs.  Skin:  No rashes, ecchymosis or petechia.  LABORATORY STUDIES:  White count is  3.9, hemoglobin 10.5, hematocrit 33, platelet count 134.  MCV is 88.  IMPRESSION:  Brittany Weber is a 78 year old black female with anemia of renal insufficiency.  She has done well with respect to Aranesp.  We will go and give a dose of Aranesp today.  Will see if she needs any iron.  I think a lot of her ferritin increase is more of an acute phase reactant.  If we find that her iron and iron saturation goes down, we may give her dose of IV iron.  I will plan to see her back myself in 6 weeks time.  She will come back in 3 weeks for a repeat CBC.    ______________________________ [name], M.D. PRE/MEDQ  D:  01/06/2012  T:  01/07/2012  Job:  [id number]

## 2012-01-14 DIAGNOSIS — I10 Essential (primary) hypertension: Secondary | ICD-10-CM | POA: Diagnosis not present

## 2012-01-14 DIAGNOSIS — I2510 Atherosclerotic heart disease of native coronary artery without angina pectoris: Secondary | ICD-10-CM | POA: Diagnosis not present

## 2012-01-14 DIAGNOSIS — E7800 Pure hypercholesterolemia, unspecified: Secondary | ICD-10-CM | POA: Diagnosis not present

## 2012-01-19 DIAGNOSIS — M169 Osteoarthritis of hip, unspecified: Secondary | ICD-10-CM | POA: Diagnosis not present

## 2012-01-19 DIAGNOSIS — M1710 Unilateral primary osteoarthritis, unspecified knee: Secondary | ICD-10-CM | POA: Diagnosis not present

## 2012-01-27 ENCOUNTER — Ambulatory Visit: Payer: Medicare Other

## 2012-01-27 ENCOUNTER — Other Ambulatory Visit (HOSPITAL_BASED_OUTPATIENT_CLINIC_OR_DEPARTMENT_OTHER): Payer: Medicare Other | Admitting: Lab

## 2012-01-27 VITALS — BP 141/74 | HR 80 | Temp 98.6°F

## 2012-01-27 DIAGNOSIS — D509 Iron deficiency anemia, unspecified: Secondary | ICD-10-CM

## 2012-01-27 DIAGNOSIS — D631 Anemia in chronic kidney disease: Secondary | ICD-10-CM

## 2012-01-27 DIAGNOSIS — N289 Disorder of kidney and ureter, unspecified: Secondary | ICD-10-CM

## 2012-01-27 DIAGNOSIS — D649 Anemia, unspecified: Secondary | ICD-10-CM | POA: Diagnosis not present

## 2012-01-27 LAB — CBC WITH DIFFERENTIAL (CANCER CENTER ONLY)
BASO#: 0 10*3/uL (ref 0.0–0.2)
Eosinophils Absolute: 0.1 10*3/uL (ref 0.0–0.5)
HGB: 12.1 g/dL (ref 11.6–15.9)
LYMPH#: 1.5 10*3/uL (ref 0.9–3.3)
MONO#: 0.6 10*3/uL (ref 0.1–0.9)
MONO%: 15.6 % — ABNORMAL HIGH (ref 0.0–13.0)
NEUT#: 1.6 10*3/uL (ref 1.5–6.5)
Platelets: 142 10*3/uL — ABNORMAL LOW (ref 145–400)
RBC: 4.33 10*6/uL (ref 3.70–5.32)
WBC: 3.8 10*3/uL — ABNORMAL LOW (ref 3.9–10.0)

## 2012-01-27 NOTE — Progress Notes (Signed)
Brittany Weber comes in today, CBC checked, Aranasp injection not given due to parameters.  Hgb was 12.1.  Patient made aware and copy of lab work given.  [name], Arron Mcnaught [employer]

## 2012-01-28 ENCOUNTER — Ambulatory Visit
Admission: RE | Admit: 2012-01-28 | Discharge: 2012-01-28 | Disposition: A | Discharge status: Home / Self Care | Payer: Medicare Other | Source: Ambulatory Visit | Attending: Orthopedic Surgery | Admitting: Orthopedic Surgery

## 2012-01-28 ENCOUNTER — Other Ambulatory Visit: Payer: Self-pay | Admitting: Orthopedic Surgery

## 2012-01-28 DIAGNOSIS — R52 Pain, unspecified: Secondary | ICD-10-CM

## 2012-01-28 DIAGNOSIS — M25559 Pain in unspecified hip: Secondary | ICD-10-CM | POA: Diagnosis not present

## 2012-01-29 DIAGNOSIS — B351 Tinea unguium: Secondary | ICD-10-CM | POA: Diagnosis not present

## 2012-01-29 DIAGNOSIS — M79609 Pain in unspecified limb: Secondary | ICD-10-CM | POA: Diagnosis not present

## 2012-02-01 DIAGNOSIS — H1045 Other chronic allergic conjunctivitis: Secondary | ICD-10-CM | POA: Diagnosis not present

## 2012-02-01 DIAGNOSIS — H2510 Age-related nuclear cataract, unspecified eye: Secondary | ICD-10-CM | POA: Diagnosis not present

## 2012-02-01 DIAGNOSIS — H35369 Drusen (degenerative) of macula, unspecified eye: Secondary | ICD-10-CM | POA: Diagnosis not present

## 2012-02-01 DIAGNOSIS — H43399 Other vitreous opacities, unspecified eye: Secondary | ICD-10-CM | POA: Diagnosis not present

## 2012-02-19 ENCOUNTER — Ambulatory Visit: Payer: Medicare Other

## 2012-02-19 ENCOUNTER — Other Ambulatory Visit (HOSPITAL_BASED_OUTPATIENT_CLINIC_OR_DEPARTMENT_OTHER): Payer: Medicare Other | Admitting: Lab

## 2012-02-19 ENCOUNTER — Ambulatory Visit (HOSPITAL_BASED_OUTPATIENT_CLINIC_OR_DEPARTMENT_OTHER): Payer: Medicare Other | Admitting: Hematology & Oncology

## 2012-02-19 VITALS — BP 141/73 | HR 71 | Temp 98.1°F | Ht 59.0 in | Wt 163.0 lb

## 2012-02-19 DIAGNOSIS — N189 Chronic kidney disease, unspecified: Secondary | ICD-10-CM

## 2012-02-19 DIAGNOSIS — D509 Iron deficiency anemia, unspecified: Secondary | ICD-10-CM | POA: Diagnosis not present

## 2012-02-19 DIAGNOSIS — N289 Disorder of kidney and ureter, unspecified: Secondary | ICD-10-CM | POA: Diagnosis not present

## 2012-02-19 DIAGNOSIS — D649 Anemia, unspecified: Secondary | ICD-10-CM | POA: Diagnosis not present

## 2012-02-19 DIAGNOSIS — M1610 Unilateral primary osteoarthritis, unspecified hip: Secondary | ICD-10-CM

## 2012-02-19 LAB — CBC WITH DIFFERENTIAL (CANCER CENTER ONLY)
BASO#: 0 10*3/uL (ref 0.0–0.2)
Eosinophils Absolute: 0.2 10*3/uL (ref 0.0–0.5)
HCT: 36.3 % (ref 34.8–46.6)
HGB: 11.6 g/dL (ref 11.6–15.9)
LYMPH%: 34.6 % (ref 14.0–48.0)
MCH: 27.8 pg (ref 26.0–34.0)
MCV: 87 fL (ref 81–101)
MONO#: 0.5 10*3/uL (ref 0.1–0.9)
Platelets: 129 10*3/uL — ABNORMAL LOW (ref 145–400)
RBC: 4.18 10*6/uL (ref 3.70–5.32)
WBC: 3.6 10*3/uL — ABNORMAL LOW (ref 3.9–10.0)

## 2012-02-19 NOTE — Progress Notes (Signed)
CC:   Myrtie Neither, MD  DIAGNOSES: 1. Anemia of renal insufficiency. 2. Intermittent iron deficiency anemia. 3. Severe degenerative arthritis of the right hip.  CURRENT THERAPY: 1. Aranesp 300 mcg subcu as needed for hemoglobin less than 11. 2. IV iron as indicated.  INTERIM HISTORY:  Ms. Henigan comes in for her followup.  As usual, she is in a lot of pain because of the right hip.  She really does not wish to have this fixed.  She just keeps putting this off.  We did go ahead and give her Aranesp back in January.  This did help her a little bit.  She has had no problems with bleeding.  She has had no bruising.  She has had no cough or shortness of breath.  She has not had any problems with fever.  PHYSICAL EXAMINATION:  General:  This is an elderly appearing white female in no obvious distress.  Vital signs:  Are 98.1, pulse 71, respiratory rate 20, blood pressure 141/73.  Weight is 163.  Head and neck:  Exam shows a normocephalic, atraumatic skull.  There are no ocular or oral lesions.  There are no palpable cervical or supraclavicular lymph nodes.  Lungs:  Lungs are clear to percussion and auscultation bilaterally.  Cardiac:  Regular rhythm with a normal S1 and S2.  There are no murmurs, rubs or bruits.  Abdomen:  Soft with good bowel sounds.  There is no palpable abdominal mass.  There is no palpable hepatosplenomegaly.  Back:  No tenderness over the spine, ribs or hips.  Extremities:  Shows marked and limited range of motion of the right hip.  She has no edema in her legs.  LABORATORY STUDIES:  White cell count 3.6, hemoglobin 11.6, hematocrit 36.3, platelet count 129.  IMPRESSION:  Mr. Ruzich is a 78 year old African American female.  She actually will be 80 on Easter Sunday.  She has anemia of renal insufficiency and intermittent iron deficiency anemia.  She is holding her own right now.  She does not need any additional booster shots or iron right  now.  When we did see her back in January, her iron saturation was only 22%.  We will go ahead and plan to get her back to see [place] in another 6 weeks. Will see how her blood counts look at that point in time.    ______________________________ [name], M.D. PRE/MEDQ  D:  02/19/2012  T:  02/19/2012  Job:  [id number]

## 2012-02-19 NOTE — Progress Notes (Signed)
This office note has been dictated.

## 2012-02-26 DIAGNOSIS — Z1231 Encounter for screening mammogram for malignant neoplasm of breast: Secondary | ICD-10-CM | POA: Diagnosis not present

## 2012-03-09 ENCOUNTER — Other Ambulatory Visit: Payer: Medicare Other | Admitting: Lab

## 2012-03-09 ENCOUNTER — Ambulatory Visit: Payer: Medicare Other

## 2012-03-22 DIAGNOSIS — M169 Osteoarthritis of hip, unspecified: Secondary | ICD-10-CM | POA: Diagnosis not present

## 2012-03-25 ENCOUNTER — Encounter: Payer: Self-pay | Admitting: Hematology & Oncology

## 2012-03-30 ENCOUNTER — Ambulatory Visit (HOSPITAL_BASED_OUTPATIENT_CLINIC_OR_DEPARTMENT_OTHER): Payer: Medicare Other | Admitting: Hematology & Oncology

## 2012-03-30 ENCOUNTER — Ambulatory Visit: Payer: Medicare Other

## 2012-03-30 ENCOUNTER — Other Ambulatory Visit (HOSPITAL_BASED_OUTPATIENT_CLINIC_OR_DEPARTMENT_OTHER): Payer: Medicare Other | Admitting: Lab

## 2012-03-30 ENCOUNTER — Ambulatory Visit: Payer: Medicare Other | Admitting: Hematology & Oncology

## 2012-03-30 ENCOUNTER — Other Ambulatory Visit: Payer: Medicare Other | Admitting: Lab

## 2012-03-30 VITALS — BP 128/70 | HR 79 | Ht <= 58 in | Wt 164.0 lb

## 2012-03-30 DIAGNOSIS — N289 Disorder of kidney and ureter, unspecified: Secondary | ICD-10-CM | POA: Diagnosis not present

## 2012-03-30 DIAGNOSIS — D649 Anemia, unspecified: Secondary | ICD-10-CM

## 2012-03-30 DIAGNOSIS — D509 Iron deficiency anemia, unspecified: Secondary | ICD-10-CM

## 2012-03-30 DIAGNOSIS — M169 Osteoarthritis of hip, unspecified: Secondary | ICD-10-CM

## 2012-03-30 DIAGNOSIS — D631 Anemia in chronic kidney disease: Secondary | ICD-10-CM

## 2012-03-30 DIAGNOSIS — N039 Chronic nephritic syndrome with unspecified morphologic changes: Secondary | ICD-10-CM | POA: Diagnosis not present

## 2012-03-30 LAB — CBC WITH DIFFERENTIAL (CANCER CENTER ONLY)
BASO%: 1 % (ref 0.0–2.0)
HCT: 34.3 % — ABNORMAL LOW (ref 34.8–46.6)
LYMPH%: 34.1 % (ref 14.0–48.0)
MCH: 27.8 pg (ref 26.0–34.0)
MCHC: 32.4 g/dL (ref 32.0–36.0)
MCV: 86 fL (ref 81–101)
MONO#: 0.6 10*3/uL (ref 0.1–0.9)
MONO%: 14.2 % — ABNORMAL HIGH (ref 0.0–13.0)
NEUT%: 46.6 % (ref 39.6–80.0)
RDW: 16 % — ABNORMAL HIGH (ref 11.1–15.7)
WBC: 3.9 10*3/uL (ref 3.9–10.0)

## 2012-03-30 NOTE — Progress Notes (Signed)
This office note has been dictated.

## 2012-03-31 NOTE — Progress Notes (Signed)
CC:   Myrtie Neither, MD  DIAGNOSES: 1. Anemia of renal insufficiency. 2. Intermittent iron-deficiency anemia. 3. Severe degenerative arthritis of right hip.  CURRENT THERAPY: 1. Aranesp 300 mcg subcu as needed for hemoglobin less than 11 (the     last dose in January 2013). 2. IV iron as indicated.  INTERIM HISTORY:  Ms. Vickers comes in for followup.  Her birthday was on Easter this year.  She had a good birthday and a good Easter.  when we last saw her in January, her ferritin was 656.  a lot of this elevation is secondary to her arthritis in the right hip.  Her iron saturation was 22% and her iron itself was 63.  She last got Aranesp back in January.  She still has a lot of problems with her right hip.  Again, she is trying to hold off on surgery for this.  She has had no fevers, sweats or chills.  There have been no bleeding issues.  She has had no rashes.  There has been no cough.  She has had no nausea or vomiting.  She has had no change in her medications.  PHYSICAL EXAMINATION:  This is an elderly black female in no obvious distress.  Vital signs:  Temperature of 98.9, pulse of 79, respiratory 18, blood pressure 128/70.  Weight is 164.  Head and neck exam shows a normocephalic, atraumatic skull.  There are no ocular or oral lesions. There are no palpable cervical or supraclavicular lymph nodes.  Lungs: Clear to percussion and auscultation bilaterally.  Cardiac:  Regular rate and rhythm with a normal S1 and S2.  There are no murmurs, rubs or bruits.  Abdomen:  Soft with good bowel sounds.  There is no palpable abdominal mass.  There is no palpable hepatosplenomegaly.  Extremities show some slight tenderness to palpation over the right hip.  She does have decreased range of motion of the right hip.  She has good strength in her legs.  Skin exam shows no rashes, ecchymosis or petechia.  LABORATORY STUDIES:  White cell count 3.9, hemoglobin 11.1, hematocrit 34.3,  platelet count 134.  MCV is 86.  IMPRESSION:  Ms. Topete is a 78 year old black female with anemia of renal insufficiency.  She is doing well.  Her hemoglobin is trending down a little bit.  We do not need to give her any Aranesp today.  We will see what her iron studies are.  It is certainly possible that we may have to give her some iron in the near future.  I want see Ms. Motl back myself in another 6 weeks or so.  She will let us know when she will have her surgery.  When she does, I want to make sure that we check her blood work and give her iron and/or Aranesp to optimize her blood counts.    ______________________________ [name], M.D. PRE/MEDQ  D:  03/30/2012  T:  03/31/2012  Job:  [id number]

## 2012-04-03 LAB — RETICULOCYTES (CHCC): Retic Ct Pct: 1 % (ref 0.4–2.3)

## 2012-04-03 LAB — IRON AND TIBC: TIBC: 284 ug/dL (ref 250–470)

## 2012-04-14 DIAGNOSIS — I2510 Atherosclerotic heart disease of native coronary artery without angina pectoris: Secondary | ICD-10-CM | POA: Diagnosis not present

## 2012-04-14 DIAGNOSIS — E7800 Pure hypercholesterolemia, unspecified: Secondary | ICD-10-CM | POA: Diagnosis not present

## 2012-04-14 DIAGNOSIS — I059 Rheumatic mitral valve disease, unspecified: Secondary | ICD-10-CM | POA: Diagnosis not present

## 2012-04-14 DIAGNOSIS — I10 Essential (primary) hypertension: Secondary | ICD-10-CM | POA: Diagnosis not present

## 2012-04-29 DIAGNOSIS — B351 Tinea unguium: Secondary | ICD-10-CM | POA: Diagnosis not present

## 2012-04-29 DIAGNOSIS — M79609 Pain in unspecified limb: Secondary | ICD-10-CM | POA: Diagnosis not present

## 2012-05-11 ENCOUNTER — Ambulatory Visit (HOSPITAL_BASED_OUTPATIENT_CLINIC_OR_DEPARTMENT_OTHER): Payer: Medicare Other

## 2012-05-11 ENCOUNTER — Ambulatory Visit (HOSPITAL_BASED_OUTPATIENT_CLINIC_OR_DEPARTMENT_OTHER): Payer: Medicare Other | Admitting: Hematology & Oncology

## 2012-05-11 ENCOUNTER — Other Ambulatory Visit (HOSPITAL_BASED_OUTPATIENT_CLINIC_OR_DEPARTMENT_OTHER): Payer: Medicare Other | Admitting: Lab

## 2012-05-11 VITALS — BP 124/69 | HR 77 | Temp 98.1°F | Ht <= 58 in | Wt 164.0 lb

## 2012-05-11 DIAGNOSIS — N289 Disorder of kidney and ureter, unspecified: Secondary | ICD-10-CM | POA: Diagnosis not present

## 2012-05-11 DIAGNOSIS — D509 Iron deficiency anemia, unspecified: Secondary | ICD-10-CM

## 2012-05-11 DIAGNOSIS — D649 Anemia, unspecified: Secondary | ICD-10-CM

## 2012-05-11 DIAGNOSIS — N039 Chronic nephritic syndrome with unspecified morphologic changes: Secondary | ICD-10-CM | POA: Diagnosis not present

## 2012-05-11 DIAGNOSIS — N189 Chronic kidney disease, unspecified: Secondary | ICD-10-CM

## 2012-05-11 LAB — CBC WITH DIFFERENTIAL (CANCER CENTER ONLY)
BASO%: 1 % (ref 0.0–2.0)
Eosinophils Absolute: 0.1 10*3/uL (ref 0.0–0.5)
HCT: 33.9 % — ABNORMAL LOW (ref 34.8–46.6)
LYMPH%: 30.6 % (ref 14.0–48.0)
MCH: 27.9 pg (ref 26.0–34.0)
MCV: 87 fL (ref 81–101)
MONO#: 0.6 10*3/uL (ref 0.1–0.9)
MONO%: 14.2 % — ABNORMAL HIGH (ref 0.0–13.0)
NEUT%: 51.5 % (ref 39.6–80.0)
Platelets: 143 10*3/uL — ABNORMAL LOW (ref 145–400)
RDW: 16 % — ABNORMAL HIGH (ref 11.1–15.7)
WBC: 4.1 10*3/uL (ref 3.9–10.0)

## 2012-05-11 MED ORDER — DARBEPOETIN ALFA-POLYSORBATE 300 MCG/0.6ML IJ SOLN
300.0000 ug | Freq: Once | INTRAMUSCULAR | Status: AC
  Administered 2012-05-11: 300 ug via SUBCUTANEOUS

## 2012-05-11 NOTE — Progress Notes (Signed)
This office note has been dictated.

## 2012-05-12 NOTE — Progress Notes (Signed)
CC:   Myrtie Neither, MD Maurice March, M.D.  DIAGNOSES: 1. Anemia of renal insufficiency. 2. Intermittent iron deficiency anemia. 3. Severe DJD right hip.  CURRENT THERAPY: 1. Aranesp 300 mcg subcu as needed for hemoglobin less than 11; the     patient to receive a dose today. 2. IV iron as indicated.  INTERIM HISTORY:  Ms. Bown comes in for followup.  She still is not going to have any surgery for her right hip.  She is just not "at peace" with having surgery.  We will go ahead and give her a dose of Aranesp today.  Hemoglobin is down to 10.9.  She feels a little bit fatigued.  We have not had to give her any iron.  Her last iron studies back in April showed a ferritin of 585.  Iron saturation was 24%.  Her iron itself was 67.  I think for Ms. Willingham, if her iron saturation gets down to less than 20, that would be an indicator for IV iron.  She has had no bleeding.  She has had no problems with leg swelling. She wears a brace on her right knee.  She has had no cough.  There has been no change in bowel or bladder habits.  She has not had any change in her medications.  PHYSICAL EXAMINATION:  This is a elderly appearing black female in no obvious distress.  Vital Signs: 98.1, pulse 77, respiratory rate 20, blood pressure 128/69.  Weight is 164.  Head and neck:  Normocephalic, atraumatic skull.  There are no ocular or oral lesions.  There are no palpable cervical or supraclavicular lymph nodes.  Lungs:  Clear bilaterally.  Cardiac:  Regular rhythm with a normal S1 and S2.  She has a 2/6 systolic ejection murmur.  Abdomen:  Soft with good bowel sounds. There is no palpable abdominal mass.  There is no fluid wave.  There is no palpable hepatosplenomegaly.  Back: No tenderness over the spine, ribs, or hips.  Extremities:  No clubbing, cyanosis or edema. Neurologic:  No focal neurological deficits.  LABORATORY STUDIES:  White cell count 4.1, hemoglobin 10.9,  hematocrit 33.9, platelet count 143.  MCV is 87.  IMPRESSION:  Ms. Briel is a 78 year old African American female with anemia of renal insufficiency.  She also has some intermittent iron deficiency anemia.  I do not think we have to give her iron now probably for over 6 months.  Will go ahead and plan to get her back in about 6 weeks to see me.  Will have her come back in 3 weeks to have her blood checked.    ______________________________ [name], M.D. PRE/MEDQ  D:  05/11/2012  T:  05/12/2012  Job:  [id number]

## 2012-05-15 LAB — IRON AND TIBC
%SAT: 28 % (ref 20–55)
TIBC: 293 ug/dL (ref 250–470)

## 2012-05-24 DIAGNOSIS — M169 Osteoarthritis of hip, unspecified: Secondary | ICD-10-CM | POA: Diagnosis not present

## 2012-06-01 ENCOUNTER — Ambulatory Visit: Payer: Medicare Other

## 2012-06-01 ENCOUNTER — Other Ambulatory Visit (HOSPITAL_BASED_OUTPATIENT_CLINIC_OR_DEPARTMENT_OTHER): Payer: Medicare Other | Admitting: Lab

## 2012-06-01 DIAGNOSIS — D649 Anemia, unspecified: Secondary | ICD-10-CM

## 2012-06-01 DIAGNOSIS — N189 Chronic kidney disease, unspecified: Secondary | ICD-10-CM

## 2012-06-01 LAB — CBC WITH DIFFERENTIAL/PLATELET
Eosinophils Absolute: 0.1 10*3/uL (ref 0.0–0.5)
HGB: 11.5 g/dL — ABNORMAL LOW (ref 11.6–15.9)
MONO#: 0.4 10*3/uL (ref 0.1–0.9)
MONO%: 12.3 % (ref 0.0–14.0)
NEUT#: 1.3 10*3/uL — ABNORMAL LOW (ref 1.5–6.5)
RBC: 4.22 10*6/uL (ref 3.70–5.45)
RDW: 15.3 % — ABNORMAL HIGH (ref 11.2–14.5)
WBC: 3 10*3/uL — ABNORMAL LOW (ref 3.9–10.3)
lymph#: 1.2 10*3/uL (ref 0.9–3.3)

## 2012-06-01 MED ORDER — DARBEPOETIN ALFA-POLYSORBATE 300 MCG/0.6ML IJ SOLN: 300.0000 ug | Freq: Once | INTRAMUSCULAR | Status: DC

## 2012-06-23 ENCOUNTER — Other Ambulatory Visit (HOSPITAL_BASED_OUTPATIENT_CLINIC_OR_DEPARTMENT_OTHER): Payer: Medicare Other | Admitting: Lab

## 2012-06-23 ENCOUNTER — Ambulatory Visit: Payer: Medicare Other

## 2012-06-23 ENCOUNTER — Ambulatory Visit (HOSPITAL_BASED_OUTPATIENT_CLINIC_OR_DEPARTMENT_OTHER): Payer: Medicare Other | Admitting: Hematology & Oncology

## 2012-06-23 DIAGNOSIS — N189 Chronic kidney disease, unspecified: Secondary | ICD-10-CM

## 2012-06-23 DIAGNOSIS — D649 Anemia, unspecified: Secondary | ICD-10-CM | POA: Diagnosis not present

## 2012-06-23 DIAGNOSIS — N039 Chronic nephritic syndrome with unspecified morphologic changes: Secondary | ICD-10-CM | POA: Diagnosis not present

## 2012-06-23 DIAGNOSIS — D509 Iron deficiency anemia, unspecified: Secondary | ICD-10-CM | POA: Diagnosis not present

## 2012-06-23 DIAGNOSIS — N289 Disorder of kidney and ureter, unspecified: Secondary | ICD-10-CM | POA: Diagnosis not present

## 2012-06-23 DIAGNOSIS — D631 Anemia in chronic kidney disease: Secondary | ICD-10-CM

## 2012-06-23 LAB — CBC WITH DIFFERENTIAL (CANCER CENTER ONLY)
BASO#: 0 10*3/uL (ref 0.0–0.2)
Eosinophils Absolute: 0.2 10*3/uL (ref 0.0–0.5)
HGB: 11.2 g/dL — ABNORMAL LOW (ref 11.6–15.9)
LYMPH%: 28.7 % (ref 14.0–48.0)
MCV: 87 fL (ref 81–101)
MONO#: 0.4 10*3/uL (ref 0.1–0.9)
Platelets: 134 10*3/uL — ABNORMAL LOW (ref 145–400)
RBC: 4.01 10*6/uL (ref 3.70–5.32)
WBC: 3 10*3/uL — ABNORMAL LOW (ref 3.9–10.0)

## 2012-06-23 LAB — IRON AND TIBC
%SAT: 18 % — ABNORMAL LOW (ref 20–55)
Iron: 49 ug/dL (ref 42–145)
UIBC: 230 ug/dL (ref 125–400)

## 2012-06-23 LAB — FERRITIN: Ferritin: 493 ng/mL — ABNORMAL HIGH (ref 10–291)

## 2012-06-23 NOTE — Progress Notes (Signed)
This office note has been dictated.

## 2012-06-28 ENCOUNTER — Telehealth: Payer: Self-pay | Admitting: Hematology & Oncology

## 2012-06-28 NOTE — Telephone Encounter (Signed)
Pt aware of 7-30 appoint

## 2012-06-28 NOTE — Progress Notes (Signed)
CC:   Myrtie Neither, MD Maurice March, M.D.  DIAGNOSIS: 1. Anemia of renal insufficiency. 2. Intermittent iron deficiency anemia. 3. Severe degenerative arthritis of the right hip.  CURRENT THERAPY: 1. Aranesp 300 mcg subcutaneous as needed for hemoglobin less than 11. 2. IV iron as indicated.  INTERIM HISTORY:  Ms. Berrian comes in for followup.  She actually is walking a little bit better.  She has not had any intervention for the right hip arthritis.  I was not sure if she is going to really ever go to surgery for this.  She was last seen in early June.  At that point in time, her ferritin was 563 with iron saturation of 28%. She has had no obvious bleeding.  She had no cough or shortness of breath.  She does feel tired.  There has been no change in bowel or bladder habits.  PHYSICAL EXAMINATION:  This is a somewhat elderly appearing black female in no obvious distress.  Vital Signs:  Show a temperature of 98, pulse 65, respiratory 18, blood pressure 127/72.  Weight is 160.  Head and neck:  Normocephalic, atraumatic skull.  There are no ocular or oral lesions.  There are no palpable cervical or supraclavicular lymph nodes. Lungs:  Clear bilaterally.  Cardiac:  Regular rate and rhythm with a normal S1 and S2.  There are no murmurs, rubs or bruits.  Abdomen:  Soft with good bowel sounds.  There is no fluid wave.  There is no palpable hepatosplenomegaly.  Back:  No tenderness over the spine, ribs, or hips. Extremities:  Shows no clubbing, cyanosis or edema.  LABORATORY STUDIES:  White cell count is 3, hemoglobin 11.2, hematocrit 35, platelet count 134.  Ferritin 493, iron saturation is 18%.  Iron is 49.  IMPRESSION:  Ms. Strum is a 78 year old African American female with multifactorial anemia.  Her iron levels are going down again.  I think we have are going to have to give her a dose of Feraheme.  I think we have done this now for over 7 or 8 months.  She did  not need any Aranesp right now.  I do think that the IV iron will help her.  Of note, the ferritin is mostly an acute phase reaction because of the arthritis.  We will plan to get her back in another month or so in followup.    ______________________________ [name], M.D. PRE/MEDQ  D:  06/27/2012  T:  06/28/2012  Job:  [id number]

## 2012-07-01 ENCOUNTER — Telehealth: Payer: Self-pay | Admitting: Oncology

## 2012-07-01 ENCOUNTER — Other Ambulatory Visit: Payer: Self-pay | Admitting: Oncology

## 2012-07-01 DIAGNOSIS — D509 Iron deficiency anemia, unspecified: Secondary | ICD-10-CM

## 2012-07-01 NOTE — Telephone Encounter (Addendum)
Message copied by Lacie Draft on Fri Jul 01, 2012  3:47 PM ------      Message from: Arlan Organ R      Created: Wed Jun 29, 2012  7:29 AM       Please call and tell her that her iron is dropping.  Need Feraheme at 1020mg  x 1 dose.  Please set this up.  Cindee Lame 3:48 PM 07/01/2012 Called patient, already scheduled for 07/05/12. [name], Arel Tippen [employer]

## 2012-07-01 NOTE — Telephone Encounter (Deleted)
Message copied by Lacie Draft on Fri Jul 01, 2012  3:38 PM ------      Message from: Arlan Organ R      Created: Wed Jun 29, 2012  7:29 AM       Please call and tell her that her iron is dropping.  Need Feraheme at 1020mg  x 1 dose.  Please set this up.  pete

## 2012-07-01 NOTE — Telephone Encounter (Deleted)
Message copied by Lacie Draft on Fri Jul 01, 2012  3:36 PM ------      Message from: Arlan Organ R      Created: Wed Jun 29, 2012  7:29 AM       Please call and tell her that her iron is dropping.  Need Feraheme at 1020mg  x 1 dose.  Please set this up.  Brittany Weber

## 2012-07-05 ENCOUNTER — Ambulatory Visit (HOSPITAL_BASED_OUTPATIENT_CLINIC_OR_DEPARTMENT_OTHER): Payer: Medicare Other

## 2012-07-05 VITALS — BP 138/78 | HR 88 | Temp 97.6°F

## 2012-07-05 DIAGNOSIS — D509 Iron deficiency anemia, unspecified: Secondary | ICD-10-CM

## 2012-07-05 MED ORDER — FERUMOXYTOL INJECTION 510 MG/17 ML: 1020.0000 mg | Freq: Once | INTRAVENOUS | Status: DC

## 2012-07-05 MED ORDER — DARBEPOETIN ALFA-POLYSORBATE 300 MCG/0.6ML IJ SOLN: 300.0000 ug | Freq: Once | INTRAMUSCULAR | Status: DC

## 2012-07-05 MED ORDER — SODIUM CHLORIDE 0.9 % IV SOLN
1020.0000 mg | Freq: Once | INTRAVENOUS | Status: AC
  Administered 2012-07-05: 1020 mg via INTRAVENOUS
  Filled 2012-07-05: qty 34

## 2012-07-14 DIAGNOSIS — E7800 Pure hypercholesterolemia, unspecified: Secondary | ICD-10-CM | POA: Diagnosis not present

## 2012-07-14 DIAGNOSIS — I059 Rheumatic mitral valve disease, unspecified: Secondary | ICD-10-CM | POA: Diagnosis not present

## 2012-07-14 DIAGNOSIS — D649 Anemia, unspecified: Secondary | ICD-10-CM | POA: Diagnosis not present

## 2012-07-14 DIAGNOSIS — I2510 Atherosclerotic heart disease of native coronary artery without angina pectoris: Secondary | ICD-10-CM | POA: Diagnosis not present

## 2012-07-14 DIAGNOSIS — I10 Essential (primary) hypertension: Secondary | ICD-10-CM | POA: Diagnosis not present

## 2012-07-19 DIAGNOSIS — M169 Osteoarthritis of hip, unspecified: Secondary | ICD-10-CM | POA: Diagnosis not present

## 2012-07-26 DIAGNOSIS — M79609 Pain in unspecified limb: Secondary | ICD-10-CM | POA: Diagnosis not present

## 2012-07-26 DIAGNOSIS — B351 Tinea unguium: Secondary | ICD-10-CM | POA: Diagnosis not present

## 2012-08-23 ENCOUNTER — Ambulatory Visit (HOSPITAL_BASED_OUTPATIENT_CLINIC_OR_DEPARTMENT_OTHER): Payer: Medicare Other | Admitting: Medical

## 2012-08-23 ENCOUNTER — Other Ambulatory Visit (HOSPITAL_BASED_OUTPATIENT_CLINIC_OR_DEPARTMENT_OTHER): Payer: Medicare Other | Admitting: Lab

## 2012-08-23 VITALS — BP 130/60 | HR 63 | Temp 98.9°F | Resp 18 | Ht <= 58 in | Wt 163.0 lb

## 2012-08-23 DIAGNOSIS — N189 Chronic kidney disease, unspecified: Secondary | ICD-10-CM

## 2012-08-23 DIAGNOSIS — D649 Anemia, unspecified: Secondary | ICD-10-CM

## 2012-08-23 DIAGNOSIS — D509 Iron deficiency anemia, unspecified: Secondary | ICD-10-CM | POA: Diagnosis not present

## 2012-08-23 DIAGNOSIS — D631 Anemia in chronic kidney disease: Secondary | ICD-10-CM | POA: Diagnosis not present

## 2012-08-23 LAB — CBC WITH DIFFERENTIAL (CANCER CENTER ONLY)
BASO#: 0 10*3/uL (ref 0.0–0.2)
EOS%: 2.1 % (ref 0.0–7.0)
HCT: 34.6 % — ABNORMAL LOW (ref 34.8–46.6)
HGB: 11.2 g/dL — ABNORMAL LOW (ref 11.6–15.9)
LYMPH#: 1 10*3/uL (ref 0.9–3.3)
MONO#: 0.5 10*3/uL (ref 0.1–0.9)
NEUT#: 2.2 10*3/uL (ref 1.5–6.5)
NEUT%: 57.9 % (ref 39.6–80.0)
RBC: 3.99 10*6/uL (ref 3.70–5.32)
WBC: 3.8 10*3/uL — ABNORMAL LOW (ref 3.9–10.0)

## 2012-08-23 LAB — IRON AND TIBC
%SAT: 34 % (ref 20–55)
TIBC: 253 ug/dL (ref 250–470)
UIBC: 168 ug/dL (ref 125–400)

## 2012-08-23 LAB — FERRITIN: Ferritin: 1392 ng/mL — ABNORMAL HIGH (ref 10–291)

## 2012-08-23 NOTE — Progress Notes (Signed)
Diagnoses: #1 anemia of renal insufficiency. #2 intermittent iron deficiency anemia. #3 severe degenerative arthritis of the right hip.  Current therapy: #1 Aranesp 300 mcg subcutaneous as needed.  For hemoglobin less than 11. #2 IV and if indicated.  Last dose of IV Feraheme 1,020mg  was given on 07/05/2012  Interim history: Brittany Weber presents today for an office followup visit.  Overall, she, reports, that she's been doing relatively well.  She recently had IV iron on 07/05/2012.  At that time her ferritin was 563, with iron saturation of 28% , and iron was 49.  She continues to have right hip, and right knee problems, but still is able to get around relatively well.  Otherwise, she does not report any new problems or complaints.  She has a good appetite.  She denies any nausea, vomiting, diarrhea, constipation, any chest pain, shortness of breath, cough, fevers, chills.  She denies any headaches or blurry vision, or any type of rashes.  Her hemoglobin is 11.2.  Today is that she will not require an Aranesp injection.  We will continue to monitor her iron levels.    Review of Systems: Pt. Denies any changes in their vision, hearing, adenopathy, fevers, chills, nausea, vomiting, diarrhea, constipation, chest pain, shortness of breath, passing blood, passing out, blacking out,  any changes in skin, joints, neurologic or psychiatric except as noted.  Physical Exam: This is a pleasant, elderly, at 78 year old, African American, female, in no obvious distress Vitals: HEENT reveals a normocephalic, atraumatic skull, no scleral icterus, no oral lesions  Neck is supple without any cervical or supraclavicular adenopathy.  Lungs are clear to auscultation bilaterally. There are no wheezes, rales or rhonci Cardiac is regular rate and rhythm with a normal S1 and S2. There are no murmurs, rubs, or bruits.  Abdomen is soft with good bowel sounds, there is no palpable mass. There is no palpable  hepatosplenomegaly. There is no palpable fluid wave.  Musculoskeletal no tenderness of the spine, ribs, or hips.  Extremities there are no clubbing, cyanosis, or edema.  Skin no petechia, purpura or ecchymosis Neurologic is nonfocal.  Laboratory Data: White count 3.8, hemoglobin 11.2, hematocrit 34.6, MCV 87, platelets 114,000  Current Outpatient Prescriptions on File Prior to Visit  Medication Sig Dispense Refill  . amLODipine-olmesartan (AZOR) 5-40 MG per tablet Take 1 tablet by mouth daily.       . diclofenac sodium (VOLTAREN) 1 % GEL Apply topically 4 (four) times daily.      . folic acid (FOLVITE) 1 MG tablet Take 1 tablet (1 mg total) by mouth 2 (two) times daily.  60 tablet  12  . meloxicam (MOBIC) 7.5 MG tablet Take 7.5 mg by mouth 2 (two) times daily.        . Misc Natural Products (WHITE [place]OW BARK PO) Take by mouth daily.        . vitamin C (ASCORBIC ACID) 500 MG tablet Take 500 mg by mouth daily. Take 2 po       Assessment/Plan: This is a pleasant, 78 year old, African American, female, with the following issues:  #1 multifactorial anemia.  Ms. Scripter does not need an Aranesp injection today.  Her hemoglobin is 11.2.  We will await and see what her iron panel comes back as.  We will give her IV iron as needed.  #2 followup-we will follow back up with [name] in 2 months, but before then should there be questions or concerns.

## 2012-10-03 DIAGNOSIS — H40029 Open angle with borderline findings, high risk, unspecified eye: Secondary | ICD-10-CM | POA: Diagnosis not present

## 2012-10-10 DIAGNOSIS — I10 Essential (primary) hypertension: Secondary | ICD-10-CM | POA: Diagnosis not present

## 2012-10-10 DIAGNOSIS — E7800 Pure hypercholesterolemia, unspecified: Secondary | ICD-10-CM | POA: Diagnosis not present

## 2012-10-13 DIAGNOSIS — E7800 Pure hypercholesterolemia, unspecified: Secondary | ICD-10-CM | POA: Diagnosis not present

## 2012-10-13 DIAGNOSIS — I10 Essential (primary) hypertension: Secondary | ICD-10-CM | POA: Diagnosis not present

## 2012-10-13 DIAGNOSIS — R0789 Other chest pain: Secondary | ICD-10-CM | POA: Diagnosis not present

## 2012-10-21 DIAGNOSIS — M79609 Pain in unspecified limb: Secondary | ICD-10-CM | POA: Diagnosis not present

## 2012-10-21 DIAGNOSIS — B351 Tinea unguium: Secondary | ICD-10-CM | POA: Diagnosis not present

## 2012-10-25 ENCOUNTER — Other Ambulatory Visit: Payer: Medicare Other | Admitting: Lab

## 2012-10-25 ENCOUNTER — Ambulatory Visit: Payer: Medicare Other | Admitting: Hematology & Oncology

## 2012-10-25 ENCOUNTER — Ambulatory Visit: Payer: Medicare Other

## 2012-10-25 DIAGNOSIS — M1710 Unilateral primary osteoarthritis, unspecified knee: Secondary | ICD-10-CM | POA: Diagnosis not present

## 2012-10-27 ENCOUNTER — Other Ambulatory Visit (HOSPITAL_BASED_OUTPATIENT_CLINIC_OR_DEPARTMENT_OTHER): Payer: Medicare Other | Admitting: Lab

## 2012-10-27 ENCOUNTER — Ambulatory Visit (HOSPITAL_BASED_OUTPATIENT_CLINIC_OR_DEPARTMENT_OTHER): Payer: Medicare Other | Admitting: Hematology & Oncology

## 2012-10-27 ENCOUNTER — Ambulatory Visit: Payer: Medicare Other

## 2012-10-27 VITALS — BP 120/57 | HR 73 | Temp 98.6°F | Resp 18 | Ht <= 58 in | Wt 160.0 lb

## 2012-10-27 DIAGNOSIS — N189 Chronic kidney disease, unspecified: Secondary | ICD-10-CM

## 2012-10-27 DIAGNOSIS — D509 Iron deficiency anemia, unspecified: Secondary | ICD-10-CM

## 2012-10-27 DIAGNOSIS — D631 Anemia in chronic kidney disease: Secondary | ICD-10-CM

## 2012-10-27 DIAGNOSIS — D649 Anemia, unspecified: Secondary | ICD-10-CM | POA: Diagnosis not present

## 2012-10-27 LAB — CBC WITH DIFFERENTIAL (CANCER CENTER ONLY)
BASO%: 0.7 % (ref 0.0–2.0)
EOS%: 3.6 % (ref 0.0–7.0)
HCT: 34 % — ABNORMAL LOW (ref 34.8–46.6)
LYMPH%: 33.8 % (ref 14.0–48.0)
MCHC: 32.4 g/dL (ref 32.0–36.0)
MCV: 92 fL (ref 81–101)
MONO%: 15.1 % — ABNORMAL HIGH (ref 0.0–13.0)
NEUT%: 46.8 % (ref 39.6–80.0)
RDW: 13.9 % (ref 11.1–15.7)

## 2012-10-27 NOTE — Progress Notes (Signed)
This office note has been dictated.

## 2012-10-27 NOTE — Progress Notes (Signed)
Patient comes in today, CBC checked, Aranasp injection not given due to parameters.  Hgb was 11. Brittany Weber, Larry Alcock [employer]

## 2012-10-28 NOTE — Progress Notes (Signed)
DIAGNOSES: 1. Anemia of renal insufficiency. 2. Intermittent iron-deficiency anemia. 3. Severe degenerative disease of the right hip.  CURRENT THERAPY: 1. Aranesp 300 mcg subcu as needed for hemoglobin less than 11. 2. IV iron as indicated.  INTERIM HISTORY:  [name] comes in for followup.  She is doing fairly well.  She still has the arthritis in her hip.  This is causing her problems.  She is not going to have surgery for this, from what she says.  There have been no problems with bleeding or bruising.  Her appetite has been okay.  When we last saw her in September, her ferritin was 1392.  This is mostly an acute phase reactant.  Her iron saturation was 34%.  PHYSICAL EXAM:  General:  This is a well-developed, well-nourished black female in no obvious distress.  Vital signs:  Temperature of 98.6, pulse 73, respiratory rate 18, blood pressure 120/57.  Weight is 160.  Head and neck:  Normocephalic, atraumatic skull.  There are no ocular or oral lesions.  There are no palpable cervical or supraclavicular lymph nodes. Lungs:  Clear bilaterally.  Cardiac:  Regular rate and rhythm with a normal S1 and S2.  There are no murmurs, rubs, or bruits.  Abdomen: Soft with good bowel sounds.  There is no palpable abdominal mass. There is no palpable hepatosplenomegaly.  Extremities:  No clubbing, cyanosis or edema.  She does have the decreased range of motion of the right hip.  LABORATORY STUDIES:  White cell count 4.1, hemoglobin 11, hematocrit 34, platelet count 138.  MCV is 92.  IMPRESSION:  [name] is a 78 year old black female with multifactorial anemia.  Her blood counts are holding pretty steady right now.  I think we can hold off on Aranesp right now.  I think we can get her through the holidays.  We will plan to get her back after January 1st for followup.  I suspect that at that point in time she may need to have an  injection.    ______________________________ [name], M.D. PRE/MEDQ  D:  10/27/2012  T:  10/28/2012  Job:  [id number]

## 2012-12-29 ENCOUNTER — Ambulatory Visit: Payer: Medicare Other

## 2012-12-29 ENCOUNTER — Ambulatory Visit (HOSPITAL_BASED_OUTPATIENT_CLINIC_OR_DEPARTMENT_OTHER): Payer: Medicare Other | Admitting: Medical

## 2012-12-29 ENCOUNTER — Other Ambulatory Visit (HOSPITAL_BASED_OUTPATIENT_CLINIC_OR_DEPARTMENT_OTHER): Payer: Medicare Other | Admitting: Lab

## 2012-12-29 VITALS — BP 113/52 | HR 70 | Temp 98.6°F | Resp 18 | Ht <= 58 in | Wt 158.0 lb

## 2012-12-29 DIAGNOSIS — D509 Iron deficiency anemia, unspecified: Secondary | ICD-10-CM | POA: Diagnosis not present

## 2012-12-29 DIAGNOSIS — N289 Disorder of kidney and ureter, unspecified: Secondary | ICD-10-CM

## 2012-12-29 DIAGNOSIS — D631 Anemia in chronic kidney disease: Secondary | ICD-10-CM

## 2012-12-29 DIAGNOSIS — D649 Anemia, unspecified: Secondary | ICD-10-CM

## 2012-12-29 DIAGNOSIS — N039 Chronic nephritic syndrome with unspecified morphologic changes: Secondary | ICD-10-CM | POA: Diagnosis not present

## 2012-12-29 LAB — IRON AND TIBC
%SAT: 25 % (ref 20–55)
TIBC: 256 ug/dL (ref 250–470)

## 2012-12-29 LAB — CBC WITH DIFFERENTIAL (CANCER CENTER ONLY)
BASO#: 0 10*3/uL (ref 0.0–0.2)
BASO%: 0.9 % (ref 0.0–2.0)
Eosinophils Absolute: 0.2 10*3/uL (ref 0.0–0.5)
HCT: 35.5 % (ref 34.8–46.6)
HGB: 11.3 g/dL — ABNORMAL LOW (ref 11.6–15.9)
LYMPH#: 1.5 10*3/uL (ref 0.9–3.3)
LYMPH%: 33.3 % (ref 14.0–48.0)
MCV: 91 fL (ref 81–101)
MONO#: 0.6 10*3/uL (ref 0.1–0.9)
NEUT%: 48.8 % (ref 39.6–80.0)
RBC: 3.89 10*6/uL (ref 3.70–5.32)
RDW: 13.6 % (ref 11.1–15.7)
WBC: 4.5 10*3/uL (ref 3.9–10.0)

## 2012-12-29 LAB — FERRITIN: Ferritin: 1311 ng/mL — ABNORMAL HIGH (ref 10–291)

## 2012-12-29 LAB — CHCC SATELLITE - SMEAR

## 2012-12-29 NOTE — Progress Notes (Addendum)
Patient comes in today, CBC checked, Aranasp injection not given due to parameters.  Hgb was 11.3 .  Patient seen by Eunice Blase, PA.

## 2012-12-29 NOTE — Progress Notes (Signed)
Diagnoses: #1 anemia of renal insufficiency. #2 intermittent iron deficiency anemia. #3 severe degenerative arthritis of the right hip.  Current therapy: #1 Aranesp 300 mcg subcutaneous as needed for hemoglobin less than 11. #2 IV and if indicated.  Last dose of IV Feraheme 1,020mg  was given on 07/05/2012  Interim history: Brittany Weber presents today for an office followup visit.  Overall, she, reports, that she's been doing relatively well.  She does continue to have arthritis in the right hip, but manages to get along quite well. She  had IV iron on 07/05/2012.  Her last iron studies back in November revealed a ferritin of 1244.  Iron 86, with 34% saturation.  Otherwise, she does not report any new problems or complaints.  She has a decent appetite.  She denies any nausea, vomiting, diarrhea, constipation, any chest pain, shortness of breath, cough, fevers, chills.  She denies any headaches or blurry vision, or any type of rashes.  Her hemoglobin is 11.3 today, as such she will not require an Aranesp injection.  We will continue to monitor her iron levels.    Review of Systems: Constitutional:Negative for malaise/fatigue, fever, chills, weight loss, diaphoresis, activity change, appetite change, and unexpected weight change.  HEENT: Negative for double vision, blurred vision, visual loss, ear pain, tinnitus, congestion, rhinorrhea, epistaxis sore throat or sinus disease, oral pain/lesion, tongue soreness Respiratory: Negative for cough, chest tightness, shortness of breath, wheezing and stridor.  Cardiovascular: Negative for chest pain, palpitations, leg swelling, orthopnea, PND, DOE or claudication Gastrointestinal: Negative for nausea, vomiting, abdominal pain, diarrhea, constipation, blood in stool, melena, hematochezia, abdominal distention, anal bleeding, rectal pain, anorexia and hematemesis.  Genitourinary: Negative for dysuria, frequency, hematuria,  Musculoskeletal: Negative for  myalgias, back pain, joint swelling, arthralgias and gait problem.  Skin: Negative for rash, color change, pallor and wound.  Neurological:. Negative for dizziness/light-headedness, tremors, seizures, syncope, facial asymmetry, speech difficulty, weakness, numbness, headaches and paresthesias.  Hematological: Negative for adenopathy. Does not bruise/bleed easily.  Psychiatric/Behavioral:  Negative for depression, no loss of interest in normal activity or change in sleep pattern.   Physical Exam: This is a pleasant, elderly, at 79 year old, African American, female, in no obvious distress Vitals: Temperature 98.6 degrees, pulse 70, respirations 18, blood pressure 113/52.  Weight 158 pounds HEENT reveals a normocephalic, atraumatic skull, no scleral icterus, no oral lesions  Neck is supple without any cervical or supraclavicular adenopathy.  Lungs are clear to auscultation bilaterally. There are no wheezes, rales or rhonci Cardiac is regular rate and rhythm with a normal S1 and S2. There are no murmurs, rubs, or bruits.  Abdomen is soft with good bowel sounds, there is no palpable mass. There is no palpable hepatosplenomegaly. There is no palpable fluid wave.  Musculoskeletal no tenderness of the spine, ribs, or hips.  Extremities there are no clubbing, cyanosis, or edema.  Skin no petechia, purpura or ecchymosis Neurologic is nonfocal.  Laboratory Data: White count 4.5, hemoglobin 11.3, hematocrit 35.5.  Platelets 130,000  Current Outpatient Prescriptions on File Prior to Visit  Medication Sig Dispense Refill  . amLODipine-olmesartan (AZOR) 10-40 MG per tablet Take 1 tablet by mouth daily.      . diclofenac sodium (VOLTAREN) 1 % GEL Apply topically 4 (four) times daily.      . folic acid (FOLVITE) 1 MG tablet Take 1 tablet (1 mg total) by mouth 2 (two) times daily.  60 tablet  12  . meloxicam (MOBIC) 7.5 MG tablet Take 7.5 mg by mouth 2 (  two) times daily.        . Misc Natural Products  (WH[place] BARK PO) Take by mouth daily.        . vitamin C (ASCORBIC ACID) 500 MG tablet Take 500 mg by mouth daily. Take 2 po       Assessment/Plan: This is a pleasant, 79 year old, African American, female, with the following issues:  #1 anemia of renal insufficiency.  Brittany Weber does not need an Aranesp injection today.  Her hemoglobin is 11.3.  Her counts seem to be holding stable for the time being.    #2.  Intermittent IV iron.  Her last dose of IV iron was back on 07/05/2012.  We are checking an iron panel on her today.  #3 followup-we will follow back up with Brittany Weber in 2 months, but before then should there be questions or concerns.

## 2013-01-10 DIAGNOSIS — M7660 Achilles tendinitis, unspecified leg: Secondary | ICD-10-CM | POA: Diagnosis not present

## 2013-01-26 DIAGNOSIS — H40029 Open angle with borderline findings, high risk, unspecified eye: Secondary | ICD-10-CM | POA: Diagnosis not present

## 2013-01-26 DIAGNOSIS — H43399 Other vitreous opacities, unspecified eye: Secondary | ICD-10-CM | POA: Diagnosis not present

## 2013-01-26 DIAGNOSIS — H04129 Dry eye syndrome of unspecified lacrimal gland: Secondary | ICD-10-CM | POA: Diagnosis not present

## 2013-01-26 DIAGNOSIS — H2510 Age-related nuclear cataract, unspecified eye: Secondary | ICD-10-CM | POA: Diagnosis not present

## 2013-02-03 DIAGNOSIS — E7800 Pure hypercholesterolemia, unspecified: Secondary | ICD-10-CM | POA: Diagnosis not present

## 2013-02-03 DIAGNOSIS — I10 Essential (primary) hypertension: Secondary | ICD-10-CM | POA: Diagnosis not present

## 2013-02-03 DIAGNOSIS — I2510 Atherosclerotic heart disease of native coronary artery without angina pectoris: Secondary | ICD-10-CM | POA: Diagnosis not present

## 2013-02-06 ENCOUNTER — Other Ambulatory Visit: Payer: Self-pay | Admitting: *Deleted

## 2013-02-06 DIAGNOSIS — D529 Folate deficiency anemia, unspecified: Secondary | ICD-10-CM

## 2013-02-06 MED ORDER — FOLIC ACID 1 MG PO TABS: 1.0000 mg | ORAL_TABLET | Freq: Two times a day (BID) | ORAL | Status: DC

## 2013-02-21 ENCOUNTER — Other Ambulatory Visit: Payer: Self-pay | Admitting: Medical

## 2013-02-23 ENCOUNTER — Other Ambulatory Visit: Payer: Medicare Other | Admitting: Lab

## 2013-02-23 ENCOUNTER — Ambulatory Visit: Payer: Medicare Other

## 2013-02-23 ENCOUNTER — Ambulatory Visit: Payer: Medicare Other | Admitting: Medical

## 2013-02-23 ENCOUNTER — Ambulatory Visit: Payer: Medicare Other | Admitting: Hematology & Oncology

## 2013-02-28 DIAGNOSIS — M79609 Pain in unspecified limb: Secondary | ICD-10-CM | POA: Diagnosis not present

## 2013-02-28 DIAGNOSIS — B351 Tinea unguium: Secondary | ICD-10-CM | POA: Diagnosis not present

## 2013-03-06 ENCOUNTER — Ambulatory Visit (HOSPITAL_BASED_OUTPATIENT_CLINIC_OR_DEPARTMENT_OTHER): Payer: Medicare Other | Admitting: Medical

## 2013-03-06 ENCOUNTER — Ambulatory Visit: Payer: Medicare Other

## 2013-03-06 ENCOUNTER — Ambulatory Visit (HOSPITAL_BASED_OUTPATIENT_CLINIC_OR_DEPARTMENT_OTHER): Payer: Medicare Other | Admitting: Lab

## 2013-03-06 DIAGNOSIS — D649 Anemia, unspecified: Secondary | ICD-10-CM

## 2013-03-06 DIAGNOSIS — D509 Iron deficiency anemia, unspecified: Secondary | ICD-10-CM

## 2013-03-06 DIAGNOSIS — N189 Chronic kidney disease, unspecified: Secondary | ICD-10-CM

## 2013-03-06 DIAGNOSIS — N289 Disorder of kidney and ureter, unspecified: Secondary | ICD-10-CM

## 2013-03-06 DIAGNOSIS — D631 Anemia in chronic kidney disease: Secondary | ICD-10-CM

## 2013-03-06 LAB — CBC WITH DIFFERENTIAL (CANCER CENTER ONLY)
BASO%: 0.5 % (ref 0.0–2.0)
Eosinophils Absolute: 0.2 10*3/uL (ref 0.0–0.5)
LYMPH%: 35.4 % (ref 14.0–48.0)
MCV: 89 fL (ref 81–101)
MONO#: 0.5 10*3/uL (ref 0.1–0.9)
MONO%: 13.8 % — ABNORMAL HIGH (ref 0.0–13.0)
NEUT#: 1.8 10*3/uL (ref 1.5–6.5)
Platelets: 114 10*3/uL — ABNORMAL LOW (ref 145–400)
RBC: 3.82 10*6/uL (ref 3.70–5.32)
RDW: 14.1 % (ref 11.1–15.7)
WBC: 3.9 10*3/uL (ref 3.9–10.0)

## 2013-03-06 NOTE — Progress Notes (Signed)
Diagnoses: #1 anemia of renal insufficiency. #2 intermittent iron deficiency anemia. #3 severe degenerative arthritis of the right hip.  Current therapy: #1 Aranesp 300 mcg subcutaneous as needed for hemoglobin less than 11. #2 IV and if indicated.  Last dose of IV Feraheme 1,020mg  was given on 07/05/2012  Interim history: Brittany Weber presents today for an office followup visit.  Overall, she, reports, that she's been doing relatively well.  She does continue to have arthritis in the right hip, but manages to get along quite well. She  had IV iron on 07/05/2012.  Her most recent iron panel.  Back in January, revealed a ferritin of 1311, and iron of 65, with 25% saturation..  Otherwise, she does not report any new problems or complaints.  She has a decent appetite.  She denies any nausea, vomiting, diarrhea, constipation, any chest pain, shortness of breath, cough, fevers, chills.  She denies any headaches or blurry vision, or any type of rashes.  Her hemoglobin is 11.1 today, as such she will not require an Aranesp injection.  We will continue to monitor her iron levels.    Review of Systems: Constitutional:Negative for malaise/fatigue, fever, chills, weight loss, diaphoresis, activity change, appetite change, and unexpected weight change.  HEENT: Negative for double vision, blurred vision, visual loss, ear pain, tinnitus, congestion, rhinorrhea, epistaxis sore throat or sinus disease, oral pain/lesion, tongue soreness Respiratory: Negative for cough, chest tightness, shortness of breath, wheezing and stridor.  Her most recent iron studies back in January, revealed an iron of 65, with 25% saturation Cardiovascular: Negative for chest pain, palpitations, leg swelling, orthopnea, PND, DOE or claudication Gastrointestinal: Negative for nausea, vomiting, abdominal pain, diarrhea, constipation, blood in stool, melena, hematochezia, abdominal distention, anal bleeding, rectal pain, anorexia and  hematemesis.  Genitourinary: Negative for dysuria, frequency, hematuria,  Musculoskeletal: Negative for myalgias, back pain, joint swelling, arthralgias and gait problem.  Skin: Negative for rash, color change, pallor and wound.  Neurological:. Negative for dizziness/light-headedness, tremors, seizures, syncope, facial asymmetry, speech difficulty, weakness, numbness, headaches and paresthesias.  Hematological: Negative for adenopathy. Does not bruise/bleed easily.  Psychiatric/Behavioral:  Negative for depression, no loss of interest in normal activity or change in sleep pattern.   Physical Exam: This is a pleasant, elderly, at 79 year old, African American, female, in no obvious distress Vitals:  temperature 98.4 degrees, pulse 74, respirations 16, blood pressure 134/56, weight 159 pounds  HEENT reveals a normocephalic, atraumatic skull, no scleral icterus, no oral lesions  Neck is supple without any cervical or supraclavicular adenopathy.  Lungs are clear to auscultation bilaterally. There are no wheezes, rales or rhonci Cardiac is regular rate and rhythm with a normal S1 and S2. There are no murmurs, rubs, or bruits.  Abdomen is soft with good bowel sounds, there is no palpable mass. There is no palpable hepatosplenomegaly. There is no palpable fluid wave.  Musculoskeletal no tenderness of the spine, ribs, or hips.  Extremities there are no clubbing, cyanosis, or edema.  Skin no petechia, purpura or ecchymosis Neurologic is nonfocal.  Laboratory Data:  white count 3.9, hemoglobin 11.1, hematocrit 34.1, platelets 114,000   Current Outpatient Prescriptions on File Prior to Visit  Medication Sig Dispense Refill  . amLODipine-olmesartan (AZOR) 10-40 MG per tablet Take 1 tablet by mouth daily.      . diclofenac sodium (VOLTAREN) 1 % GEL Apply topically 4 (four) times daily.      . folic acid (FOLVITE) 1 MG tablet Take 1 tablet (1 mg total) by mouth 2 (  two) times daily.  60 tablet  12  .  meloxicam (MOBIC) 7.5 MG tablet Take 7.5 mg by mouth 2 (two) times daily.        . Misc Natural Products (WH[place] BARK PO) Take by mouth daily.        . vitamin C (ASCORBIC ACID) 500 MG tablet Take 500 mg by mouth daily. Take 2 po       Assessment/Plan: This is a pleasant, 79 year old, African American, female, with the following issues:  #1 anemia of renal insufficiency.  Brittany Weber does not need an Aranesp injection today.  Her hemoglobin is 11.1.  Her counts seem to be holding stable for the time being.    #2. Intermittent IV iron.  Her last dose of IV iron was back on 07/05/2012.  We are checking an iron panel on her today.  #3 followup-we will follow back up with Brittany Weber in 2 months, but before then should there be questions or concerns.

## 2013-03-08 DIAGNOSIS — Z0000 Encounter for general adult medical examination without abnormal findings: Secondary | ICD-10-CM | POA: Diagnosis not present

## 2013-03-08 DIAGNOSIS — I10 Essential (primary) hypertension: Secondary | ICD-10-CM | POA: Diagnosis not present

## 2013-03-14 DIAGNOSIS — Z1231 Encounter for screening mammogram for malignant neoplasm of breast: Secondary | ICD-10-CM | POA: Diagnosis not present

## 2013-03-14 DIAGNOSIS — M899 Disorder of bone, unspecified: Secondary | ICD-10-CM | POA: Diagnosis not present

## 2013-03-14 DIAGNOSIS — M949 Disorder of cartilage, unspecified: Secondary | ICD-10-CM | POA: Diagnosis not present

## 2013-04-11 DIAGNOSIS — M1710 Unilateral primary osteoarthritis, unspecified knee: Secondary | ICD-10-CM | POA: Diagnosis not present

## 2013-05-04 ENCOUNTER — Ambulatory Visit (HOSPITAL_BASED_OUTPATIENT_CLINIC_OR_DEPARTMENT_OTHER): Payer: Medicare Other | Admitting: Medical

## 2013-05-04 ENCOUNTER — Other Ambulatory Visit (HOSPITAL_BASED_OUTPATIENT_CLINIC_OR_DEPARTMENT_OTHER): Payer: Medicare Other | Admitting: Lab

## 2013-05-04 VITALS — BP 138/63 | HR 85 | Temp 98.9°F | Resp 16 | Ht <= 58 in | Wt 156.0 lb

## 2013-05-04 DIAGNOSIS — D649 Anemia, unspecified: Secondary | ICD-10-CM | POA: Diagnosis not present

## 2013-05-04 DIAGNOSIS — N289 Disorder of kidney and ureter, unspecified: Secondary | ICD-10-CM

## 2013-05-04 DIAGNOSIS — D509 Iron deficiency anemia, unspecified: Secondary | ICD-10-CM

## 2013-05-04 DIAGNOSIS — D631 Anemia in chronic kidney disease: Secondary | ICD-10-CM

## 2013-05-04 DIAGNOSIS — N039 Chronic nephritic syndrome with unspecified morphologic changes: Secondary | ICD-10-CM | POA: Diagnosis not present

## 2013-05-04 DIAGNOSIS — N189 Chronic kidney disease, unspecified: Secondary | ICD-10-CM

## 2013-05-04 LAB — CBC WITH DIFFERENTIAL (CANCER CENTER ONLY)
BASO#: 0 10*3/uL (ref 0.0–0.2)
Eosinophils Absolute: 0.1 10*3/uL (ref 0.0–0.5)
HGB: 11.3 g/dL — ABNORMAL LOW (ref 11.6–15.9)
LYMPH%: 38.6 % (ref 14.0–48.0)
MCH: 29.1 pg (ref 26.0–34.0)
MCV: 92 fL (ref 81–101)
MONO#: 0.5 10*3/uL (ref 0.1–0.9)
MONO%: 13.2 % — ABNORMAL HIGH (ref 0.0–13.0)
RBC: 3.88 10*6/uL (ref 3.70–5.32)
WBC: 3.7 10*3/uL — ABNORMAL LOW (ref 3.9–10.0)

## 2013-05-04 LAB — IRON AND TIBC
%SAT: 28 % (ref 20–55)
Iron: 72 ug/dL (ref 42–145)
TIBC: 260 ug/dL (ref 250–470)

## 2013-05-04 NOTE — Progress Notes (Signed)
Diagnoses: #1 anemia of renal insufficiency. #2 intermittent iron deficiency anemia. #3 severe degenerative arthritis of the right hip.  Current therapy: #1 Aranesp 300 mcg subcutaneous as needed for hemoglobin less than 11. #2 IV and if indicated.  Last dose of IV Feraheme 1,020mg  was given on 07/05/2012  Interim history: Brittany Weber presents today for an office followup visit.  Overall, she, reports, that she's been doing relatively well.  She does continue to have arthritis in the right hip, but manages to get along quite well. She  had IV iron on 07/05/2012.  Her most recent iron panel.  Back in March, revealed a ferritin of 1055, and iron of 90, with 34% saturation..  Otherwise, she does not report any new problems or complaints.  She has a decent appetite.  She denies any nausea, vomiting, diarrhea, constipation, any chest pain, shortness of breath, cough, fevers, chills.  She denies any headaches or blurry vision, or any type of rashes.  Her hemoglobin is 11.3 today, as such she will not require an Aranesp injection.  We will continue to monitor her iron levels.    Review of Systems: Constitutional:Negative for malaise/fatigue, fever, chills, weight loss, diaphoresis, activity change, appetite change, and unexpected weight change.  HEENT: Negative for double vision, blurred vision, visual loss, ear pain, tinnitus, congestion, rhinorrhea, epistaxis sore throat or sinus disease, oral pain/lesion, tongue soreness Respiratory: Negative for cough, chest tightness, shortness of breath, wheezing and stridor.  Cardiovascular: Negative for chest pain, palpitations, leg swelling, orthopnea, PND, DOE or claudication Gastrointestinal: Negative for nausea, vomiting, abdominal pain, diarrhea, constipation, blood in stool, melena, hematochezia, abdominal distention, anal bleeding, rectal pain, anorexia and hematemesis.  Genitourinary: Negative for dysuria, frequency, hematuria,  Musculoskeletal: Negative  for myalgias, back pain, joint swelling, arthralgias and gait problem.  Skin: Negative for rash, color change, pallor and wound.  Neurological:. Negative for dizziness/light-headedness, tremors, seizures, syncope, facial asymmetry, speech difficulty, weakness, numbness, headaches and paresthesias.  Hematological: Negative for adenopathy. Does not bruise/bleed easily.  Psychiatric/Behavioral:  Negative for depression, no loss of interest in normal activity or change in sleep pattern.   Physical Exam: This is a pleasant, elderly, at 79 year old, African American, female, in no obvious distress Vitals:  Temperature 98.9 degrees pulse 85 respirations 16 blood pressure 130/63 weight 156 pounds HEENT reveals a normocephalic, atraumatic skull, no scleral icterus, no oral lesions  Neck is supple without any cervical or supraclavicular adenopathy.  Lungs are clear to auscultation bilaterally. There are no wheezes, rales or rhonci Cardiac is regular rate and rhythm with a normal S1 and S2. There are no murmurs, rubs, or bruits.  Abdomen is soft with good bowel sounds, there is no palpable mass. There is no palpable hepatosplenomegaly. There is no palpable fluid wave.  Musculoskeletal no tenderness of the spine, ribs, or hips.  Extremities there are no clubbing, cyanosis, or edema.  Skin no petechia, purpura or ecchymosis Neurologic is nonfocal.  Laboratory Data:  white count 3.7 hemoglobin 11.3 hematocrit 35 5 MCV 92 platelets 135,000   Current Outpatient Prescriptions on File Prior to Visit  Medication Sig Dispense Refill  . amLODipine-olmesartan (AZOR) 10-40 MG per tablet Take 1 tablet by mouth daily.      . diclofenac sodium (VOLTAREN) 1 % GEL Apply topically 4 (four) times daily.      . folic acid (FOLVITE) 1 MG tablet Take 1 tablet (1 mg total) by mouth 2 (two) times daily.  60 tablet  12  . meloxicam (MOBIC) 7.5 MG  tablet Take 7.5 mg by mouth 2 (two) times daily.        . Misc Natural Products  (WH[place] BARK PO) Take by mouth daily.        . vitamin C (ASCORBIC ACID) 500 MG tablet Take 500 mg by mouth daily. Take 2 po       Assessment/Plan: This is a pleasant, 79 year old, African American, female, with the following issues:  #1 anemia of renal insufficiency.  Brittany Weber does not need an Aranesp injection today.  Her hemoglobin is 11.3.  Her counts seem to be holding stable for the time being.    #2. Intermittent IV iron.  Her last dose of IV iron was back on 07/05/2012.  We are checking an iron panel on her today.  #3 followup-we will follow back up with Brittany Weber in 2 months, but before then should there be questions or concerns.

## 2013-05-05 DIAGNOSIS — I2510 Atherosclerotic heart disease of native coronary artery without angina pectoris: Secondary | ICD-10-CM | POA: Diagnosis not present

## 2013-05-05 DIAGNOSIS — E7800 Pure hypercholesterolemia, unspecified: Secondary | ICD-10-CM | POA: Diagnosis not present

## 2013-05-05 DIAGNOSIS — I10 Essential (primary) hypertension: Secondary | ICD-10-CM | POA: Diagnosis not present

## 2013-05-23 DIAGNOSIS — M79609 Pain in unspecified limb: Secondary | ICD-10-CM | POA: Diagnosis not present

## 2013-05-23 DIAGNOSIS — B351 Tinea unguium: Secondary | ICD-10-CM | POA: Diagnosis not present

## 2013-07-06 ENCOUNTER — Ambulatory Visit (HOSPITAL_BASED_OUTPATIENT_CLINIC_OR_DEPARTMENT_OTHER): Payer: Medicare Other | Admitting: Hematology & Oncology

## 2013-07-06 ENCOUNTER — Other Ambulatory Visit (HOSPITAL_BASED_OUTPATIENT_CLINIC_OR_DEPARTMENT_OTHER): Payer: Medicare Other | Admitting: Lab

## 2013-07-06 ENCOUNTER — Ambulatory Visit (HOSPITAL_BASED_OUTPATIENT_CLINIC_OR_DEPARTMENT_OTHER): Payer: Medicare Other

## 2013-07-06 VITALS — BP 122/59 | HR 69 | Temp 98.7°F | Resp 16 | Ht 59.0 in | Wt 156.0 lb

## 2013-07-06 DIAGNOSIS — D509 Iron deficiency anemia, unspecified: Secondary | ICD-10-CM

## 2013-07-06 DIAGNOSIS — D631 Anemia in chronic kidney disease: Secondary | ICD-10-CM

## 2013-07-06 DIAGNOSIS — N039 Chronic nephritic syndrome with unspecified morphologic changes: Secondary | ICD-10-CM | POA: Diagnosis not present

## 2013-07-06 DIAGNOSIS — D649 Anemia, unspecified: Secondary | ICD-10-CM

## 2013-07-06 DIAGNOSIS — N289 Disorder of kidney and ureter, unspecified: Secondary | ICD-10-CM | POA: Diagnosis not present

## 2013-07-06 DIAGNOSIS — N189 Chronic kidney disease, unspecified: Secondary | ICD-10-CM

## 2013-07-06 LAB — CBC WITH DIFFERENTIAL (CANCER CENTER ONLY)
Eosinophils Absolute: 0.1 10*3/uL (ref 0.0–0.5)
LYMPH#: 1.9 10*3/uL (ref 0.9–3.3)
LYMPH%: 37.1 % (ref 14.0–48.0)
MCV: 92 fL (ref 81–101)
MONO#: 0.6 10*3/uL (ref 0.1–0.9)
Platelets: 136 10*3/uL — ABNORMAL LOW (ref 145–400)
RBC: 3.69 10*6/uL — ABNORMAL LOW (ref 3.70–5.32)
WBC: 5.1 10*3/uL (ref 3.9–10.0)

## 2013-07-06 LAB — IRON AND TIBC CHCC
%SAT: 21 % (ref 21–57)
Iron: 49 ug/dL (ref 41–142)
TIBC: 239 ug/dL (ref 236–444)
UIBC: 190 ug/dL (ref 120–384)

## 2013-07-06 LAB — FERRITIN CHCC: Ferritin: 902 ng/ml — ABNORMAL HIGH (ref 9–269)

## 2013-07-06 MED ORDER — DARBEPOETIN ALFA-POLYSORBATE 300 MCG/0.6ML IJ SOLN
300.0000 ug | Freq: Once | INTRAMUSCULAR | Status: AC
  Administered 2013-07-06: 300 ug via SUBCUTANEOUS

## 2013-07-06 NOTE — Patient Instructions (Signed)
Darbepoetin Alfa injection What is this medicine? DARBEPOETIN ALFA (dar be POE e tin AL fa) helps your body make more red blood cells. It is used to treat anemia caused by chronic kidney failure and chemotherapy. This medicine may be used for other purposes; ask your health care provider or pharmacist if you have questions. What should I tell my health care provider before I take this medicine? They need to know if you have any of these conditions: -blood clotting disorders or history of blood clots -cancer patient not on chemotherapy -cystic fibrosis -heart disease, such as angina, heart failure, or a history of a heart attack -hemoglobin level of 12 g/dL or greater -high blood pressure -low levels of folate, iron, or vitamin B12 -seizures -an unusual or allergic reaction to darbepoetin, erythropoietin, albumin, hamster proteins, latex, other medicines, foods, dyes, or preservatives -pregnant or trying to get pregnant -breast-feeding How should I use this medicine? This medicine is for injection into a vein or under the skin. It is usually given by a health care professional in a hospital or clinic setting. If you get this medicine at home, you will be taught how to prepare and give this medicine. Do not shake the solution before you withdraw a dose. Use exactly as directed. Take your medicine at regular intervals. Do not take your medicine more often than directed. It is important that you put your used needles and syringes in a special sharps container. Do not put them in a trash can. If you do not have a sharps container, call your pharmacist or healthcare provider to get one. Talk to your pediatrician regarding the use of this medicine in children. While this medicine may be used in children as young as 1 year for selected conditions, precautions do apply. Overdosage: If you think you have taken too much of this medicine contact a poison control center or emergency room at once. NOTE:  This medicine is only for you. Do not share this medicine with others. What if I miss a dose? If you miss a dose, take it as soon as you can. If it is almost time for your next dose, take only that dose. Do not take double or extra doses. What may interact with this medicine? Do not take this medicine with any of the following medications: -epoetin alfa This list may not describe all possible interactions. Give your health care provider a list of all the medicines, herbs, non-prescription drugs, or dietary supplements you use. Also tell them if you smoke, drink alcohol, or use illegal drugs. Some items may interact with your medicine. What should I watch for while using this medicine? Visit your prescriber or health care professional for regular checks on your progress and for the needed blood tests and blood pressure measurements. It is especially important for the doctor to make sure your hemoglobin level is in the desired range, to limit the risk of potential side effects and to give you the best benefit. Keep all appointments for any recommended tests. Check your blood pressure as directed. Ask your doctor what your blood pressure should be and when you should contact him or her. As your body makes more red blood cells, you may need to take iron, folic acid, or vitamin B supplements. Ask your doctor or health care provider which products are right for you. If you have kidney disease continue dietary restrictions, even though this medication can make you feel better. Talk with your doctor or health care professional about the   foods you eat and the vitamins that you take. What side effects may I notice from receiving this medicine? Side effects that you should report to your doctor or health care professional as soon as possible: -allergic reactions like skin rash, itching or hives, swelling of the face, lips, or tongue -breathing problems -changes in vision -chest pain -confusion, trouble speaking  or understanding -feeling faint or lightheaded, falls -high blood pressure -muscle aches or pains -pain, swelling, warmth in the leg -rapid weight gain -severe headaches -sudden numbness or weakness of the face, arm or leg -trouble walking, dizziness, loss of balance or coordination -seizures (convulsions) -swelling of the ankles, feet, hands -unusually weak or tired Side effects that usually do not require medical attention (report to your doctor or health care professional if they continue or are bothersome): -diarrhea -fever, chills (flu-like symptoms) -headaches -nausea, vomiting -redness, stinging, or swelling at site where injected This list may not describe all possible side effects. Call your doctor for medical advice about side effects. You may report side effects to FDA at 1-800-FDA-1088. Where should I keep my medicine? Keep out of the reach of children. Store in a refrigerator between 2 and 8 degrees C (36 and 46 degrees F). Do not freeze. Do not shake. Throw away any unused portion if using a single-dose vial. Throw away any unused medicine after the expiration date. NOTE: This sheet is a summary. It may not cover all possible information. If you have questions about this medicine, talk to your doctor, pharmacist, or health care provider.  2013, Elsevier/Gold Standard. (11/06/2008 10:23:57 AM)  

## 2013-07-06 NOTE — Progress Notes (Signed)
This office note has been dictated.

## 2013-07-07 ENCOUNTER — Encounter: Payer: Self-pay | Admitting: *Deleted

## 2013-07-07 ENCOUNTER — Telehealth: Payer: Self-pay | Admitting: Hematology & Oncology

## 2013-07-07 DIAGNOSIS — D509 Iron deficiency anemia, unspecified: Secondary | ICD-10-CM

## 2013-07-07 NOTE — Progress Notes (Signed)
DIAGNOSES: 1. Anemia of renal insufficiency. 2. Intermittent iron-deficiency anemia.  CURRENT THERAPY: 1. Aranesp 300 mcg subcu as needed for hemoglobin less than 11, dose     to be given today. 2. IV iron as indicated.  INTERIM HISTORY:  Brittany Weber comes in for her followup.  She is holding her own.  I just do not think she will ever have surgery for her right hip.  She gets around with a rolling walker, which she seems to do fairly well with.  She last had iron studies back in May.  Her ferritin was 841 with an iron saturation of 28%.  Her total iron was 72.  She says she does feel a little tired.  We will have to check her iron studies today.  Of note, the ferritin elevation is mostly an acute-phase reactant.  Her appetite has been okay.  She has had no problems with nausea or vomiting.  There has been no cough.  She has not noted any leg swelling.  Overall, her performance status is ECOG 2.  PHYSICAL EXAMINATION:  General:  This is an elderly-appearing African American female in no obvious distress.  Vital signs:  Temperature of 98.7, pulse 69, respiratory rate 16, blood pressure 122/57.  Weight is 156.  Head and neck:  Normocephalic, atraumatic skull.  There are no ocular or oral lesions.  There are no palpable cervical or supraclavicular lymph nodes.  Lungs:  Clear bilaterally.  Cardiac: Regular rate and rhythm with a normal S1 and S2.  There are no murmurs, rubs or bruits.  Abdomen:  Soft.  She has good bowel sounds.  There is no palpable abdominal mass.  There is no fluid wave.  There is no palpable hepatosplenomegaly.  Extremities:  Show a brace on her right leg.  There may be some slight pitting edema in the right lower leg. Left leg is unremarkable.  LABORATORIES STUDIES:  White cell count 5.1, hemoglobin 10.7, hematocrit 33.9, platelet count 136.  MCV is 92.  IMPRESSION:  Brittany Weber is a very nice 79 year old African American female with multifactorial  anemia.  We will go ahead and give her a dose of Aranesp today.  Her blood pressures are right.  I think that will help her so we get her through until the fall.  Because her blood counts are doing fairly well, we probably can get Brittany Weber back in another 2 months.  I do not think she has to come back next month for any lab work.    ______________________________ [name], M.D. PRE/MEDQ  D:  07/06/2013  T:  07/07/2013  Job:  [id number]

## 2013-07-07 NOTE — Telephone Encounter (Signed)
Pt made 8-6 iron infusion appointment

## 2013-07-12 ENCOUNTER — Ambulatory Visit (HOSPITAL_BASED_OUTPATIENT_CLINIC_OR_DEPARTMENT_OTHER): Payer: Medicare Other

## 2013-07-12 VITALS — BP 110/62 | HR 69 | Temp 98.2°F

## 2013-07-12 DIAGNOSIS — D509 Iron deficiency anemia, unspecified: Secondary | ICD-10-CM | POA: Diagnosis not present

## 2013-07-12 MED ORDER — SODIUM CHLORIDE 0.9 % IV SOLN
1020.0000 mg | Freq: Once | INTRAVENOUS | Status: AC
  Administered 2013-07-12: 1020 mg via INTRAVENOUS
  Filled 2013-07-12: qty 34

## 2013-07-12 NOTE — Patient Instructions (Addendum)
Ferumoxytol injection What is this medicine? FERUMOXYTOL is an iron complex. Iron is used to make healthy red blood cells, which carry oxygen and nutrients throughout the body. This medicine is used to treat iron deficiency anemia in people with chronic kidney disease. This medicine may be used for other purposes; ask your health care provider or pharmacist if you have questions. What should I tell my health care provider before I take this medicine? They need to know if you have any of these conditions: -anemia not caused by low iron levels -high levels of iron in the blood -magnetic resonance imaging (MRI) test scheduled -an unusual or allergic reaction to iron, other medicines, foods, dyes, or preservatives -pregnant or trying to get pregnant -breast-feeding How should I use this medicine? This medicine is for infusion into a vein. It is given by a health care professional in a hospital or clinic setting. Talk to your pediatrician regarding the use of this medicine in children. Special care may be needed. Overdosage: If you think you've taken too much of this medicine contact a poison control center or emergency room at once. Overdosage: If you think you have taken too much of this medicine contact a poison control center or emergency room at once. NOTE: This medicine is only for you. Do not share this medicine with others. What if I miss a dose? It is important not to miss your dose. Call your doctor or health care professional if you are unable to keep an appointment. What may interact with this medicine? This medicine may interact with the following medications: -other iron products This list may not describe all possible interactions. Give your health care provider a list of all the medicines, herbs, non-prescription drugs, or dietary supplements you use. Also tell them if you smoke, drink alcohol, or use illegal drugs. Some items may interact with your medicine. What should I watch  for while using this medicine? Visit your doctor or healthcare professional regularly. Tell your doctor or healthcare professional if your symptoms do not start to get better or if they get worse. You may need blood work done while you are taking this medicine. You may need to follow a special diet. Talk to your doctor. Foods that contain iron include: whole grains/cereals, dried fruits, beans, or peas, leafy green vegetables, and organ meats (liver, kidney). What side effects may I notice from receiving this medicine? Side effects that you should report to your doctor or health care professional as soon as possible: -allergic reactions like skin rash, itching or hives, swelling of the face, lips, or tongue -breathing problems -changes in blood pressure -feeling faint or lightheaded, falls -fever or chills -flushing, sweating, or hot feelings -swelling of the ankles or feet Side effects that usually do not require medical attention (Report these to your doctor or health care professional if they continue or are bothersome.): -diarrhea -headache -nausea, vomiting -stomach pain This list may not describe all possible side effects. Call your doctor for medical advice about side effects. You may report side effects to FDA at 1-800-FDA-1088. Where should I keep my medicine? This drug is given in a hospital or clinic and will not be stored at home. NOTE: This sheet is a summary. It may not cover all possible information. If you have questions about this medicine, talk to your doctor, pharmacist, or health care provider.  2013, Elsevier/Gold Standard. (08/15/2008 9:48:25 PM)  

## 2013-08-01 DIAGNOSIS — D649 Anemia, unspecified: Secondary | ICD-10-CM | POA: Diagnosis not present

## 2013-08-01 DIAGNOSIS — I2510 Atherosclerotic heart disease of native coronary artery without angina pectoris: Secondary | ICD-10-CM | POA: Diagnosis not present

## 2013-08-01 DIAGNOSIS — E7800 Pure hypercholesterolemia, unspecified: Secondary | ICD-10-CM | POA: Diagnosis not present

## 2013-08-01 DIAGNOSIS — I10 Essential (primary) hypertension: Secondary | ICD-10-CM | POA: Diagnosis not present

## 2013-08-15 DIAGNOSIS — B351 Tinea unguium: Secondary | ICD-10-CM | POA: Diagnosis not present

## 2013-08-15 DIAGNOSIS — M79609 Pain in unspecified limb: Secondary | ICD-10-CM | POA: Diagnosis not present

## 2013-08-23 DIAGNOSIS — M169 Osteoarthritis of hip, unspecified: Secondary | ICD-10-CM | POA: Diagnosis not present

## 2013-08-31 ENCOUNTER — Other Ambulatory Visit: Payer: Self-pay | Admitting: *Deleted

## 2013-08-31 DIAGNOSIS — D529 Folate deficiency anemia, unspecified: Secondary | ICD-10-CM

## 2013-08-31 MED ORDER — FOLIC ACID 1 MG PO TABS: 1.0000 mg | ORAL_TABLET | Freq: Two times a day (BID) | ORAL | Status: DC

## 2013-08-31 NOTE — Telephone Encounter (Signed)
Pt called stating [facility] sent a refill request last week for Folic Acid and wanted to know why it hadn't been filled. The office did not receive a request but sent a refill today via e-rx to the pharmacy # she left on the voicemail. Pt made aware.

## 2013-09-07 ENCOUNTER — Ambulatory Visit: Payer: Medicare Other

## 2013-09-07 ENCOUNTER — Other Ambulatory Visit: Payer: Medicare Other | Admitting: Lab

## 2013-09-07 ENCOUNTER — Ambulatory Visit: Payer: Medicare Other | Admitting: Hematology & Oncology

## 2013-09-08 ENCOUNTER — Telehealth: Payer: Self-pay | Admitting: Hematology & Oncology

## 2013-09-08 NOTE — Telephone Encounter (Signed)
Pt moved 10-6 to 10-9

## 2013-09-11 ENCOUNTER — Ambulatory Visit: Payer: Medicare Other | Admitting: Hematology & Oncology

## 2013-09-11 ENCOUNTER — Other Ambulatory Visit: Payer: Medicare Other | Admitting: Lab

## 2013-09-11 ENCOUNTER — Ambulatory Visit: Payer: Medicare Other

## 2013-09-14 ENCOUNTER — Ambulatory Visit: Payer: Medicare Other | Admitting: Hematology & Oncology

## 2013-09-14 ENCOUNTER — Other Ambulatory Visit: Payer: Medicare Other | Admitting: Lab

## 2013-09-14 ENCOUNTER — Ambulatory Visit: Payer: Medicare Other

## 2013-10-04 ENCOUNTER — Ambulatory Visit: Payer: Medicare Other

## 2013-10-04 ENCOUNTER — Other Ambulatory Visit (HOSPITAL_BASED_OUTPATIENT_CLINIC_OR_DEPARTMENT_OTHER): Payer: Medicare Other | Admitting: Lab

## 2013-10-04 ENCOUNTER — Ambulatory Visit (HOSPITAL_BASED_OUTPATIENT_CLINIC_OR_DEPARTMENT_OTHER): Payer: Medicare Other | Admitting: Hematology & Oncology

## 2013-10-04 VITALS — BP 133/64 | HR 79 | Temp 98.0°F | Resp 14 | Ht 59.0 in | Wt 155.0 lb

## 2013-10-04 DIAGNOSIS — D631 Anemia in chronic kidney disease: Secondary | ICD-10-CM

## 2013-10-04 DIAGNOSIS — N289 Disorder of kidney and ureter, unspecified: Secondary | ICD-10-CM

## 2013-10-04 DIAGNOSIS — D649 Anemia, unspecified: Secondary | ICD-10-CM

## 2013-10-04 DIAGNOSIS — D509 Iron deficiency anemia, unspecified: Secondary | ICD-10-CM | POA: Diagnosis not present

## 2013-10-04 LAB — CBC WITH DIFFERENTIAL (CANCER CENTER ONLY)
BASO%: 1 % (ref 0.0–2.0)
EOS%: 2.5 % (ref 0.0–7.0)
Eosinophils Absolute: 0.1 10*3/uL (ref 0.0–0.5)
LYMPH%: 30 % (ref 14.0–48.0)
MCH: 29.3 pg (ref 26.0–34.0)
MCHC: 32 g/dL (ref 32.0–36.0)
MCV: 91 fL (ref 81–101)
MONO%: 11.5 % (ref 0.0–13.0)
NEUT#: 2.2 10*3/uL (ref 1.5–6.5)
Platelets: 124 10*3/uL — ABNORMAL LOW (ref 145–400)
RBC: 3.96 10*6/uL (ref 3.70–5.32)
RDW: 15 % (ref 11.1–15.7)

## 2013-10-04 LAB — IRON AND TIBC CHCC
Iron: 129 ug/dL (ref 41–142)
TIBC: 234 ug/dL — ABNORMAL LOW (ref 236–444)

## 2013-10-04 LAB — FERRITIN CHCC: Ferritin: 1905 ng/ml — ABNORMAL HIGH (ref 9–269)

## 2013-10-04 NOTE — Progress Notes (Signed)
This office note has been dictated.

## 2013-10-04 NOTE — Progress Notes (Unsigned)
No injection needed today.  Hgb was 11.6

## 2013-10-05 DIAGNOSIS — H40029 Open angle with borderline findings, high risk, unspecified eye: Secondary | ICD-10-CM | POA: Diagnosis not present

## 2013-10-05 DIAGNOSIS — H04129 Dry eye syndrome of unspecified lacrimal gland: Secondary | ICD-10-CM | POA: Diagnosis not present

## 2013-10-05 NOTE — Progress Notes (Signed)
DIAGNOSES: 1. Anemia of renal insufficiency. 2. Intermittent iron-deficiency anemia.  CURRENT THERAPY: 1. Aranesp 300 mcg subcu as needed for hemoglobin less than 11. 2. IV iron as indicated.  INTERIM HISTORY:  Brittany Weber comes in for followup.  She is still having issues with her right hip.  Again, she will not have surgery for this.  She has a brace on her right leg.  We did go ahead and give her Aranesp back in July.  This really has helped her out.  When we saw her back in July, her iron saturation was down to 21%.  Her ferritin was quite high which is an acute phase reactant.  We did give her iron back in August with Feraheme at a dose of 1020 mg.  She has had no problems with bleeding.  There has been no abdominal pain.  She has had no cough or shortness of breath.  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  PHYSICAL EXAMINATION:  General:  This is a well-developed, well- nourished, African American female in no obvious distress.  Vital signs show temperature of 98, pulse 79, respiratory rate 14, blood pressure 133/64, weight is 155 pounds.  Head and Neck:  Show a normocephalic atraumatic skull.  There are no ocular or oral lesions.  There are no palpable cervical or supraclavicular lymph nodes.  Lungs:  Clear bilaterally.  Cardiac:  Regular rate and rhythm with a normal S1 and S2. There are no murmurs, rubs, or bruits.  Abdomen:  Soft.  She has good bowel sounds.  There is no fluid wave.  There is no palpable abdominal mass.  No palpable hepatosplenomegaly.  Extremities shows a brace on her right leg.  She has decreased range of motion of the right knee and hip. There is some slight chronic, nonpitting edema of the left lower legs. She has 4+/5 strength in her legs.  Skin: No rashes, ecchymosis, or petechia.  LABORATORY STUDIES:  White cell count 4.1, hemoglobin 11.6, hematocrit 36.2, platelet count 124.  MCV is 91.  IMPRESSION:  Mr.  Holstad is a 79 year old African female with multifactorial anemia.  She has responded incredibly well with Aranesp and iron that we gave her back in late July or early August.  I think because she has done so well, we can probably get her back after the holidays now.  I do not see that we need any blood work in between visits.    ______________________________ Brittany Weber, M.D. PRE/MEDQ  D:  10/04/2013  T:  10/05/2013  Job:  [id number]

## 2013-10-08 LAB — TRANSFERRIN RECEPTOR, SOLUABLE: Transferrin Receptor, Soluble: 0.82 mg/L (ref 0.76–1.76)

## 2013-10-24 DIAGNOSIS — M169 Osteoarthritis of hip, unspecified: Secondary | ICD-10-CM | POA: Diagnosis not present

## 2013-10-31 DIAGNOSIS — D649 Anemia, unspecified: Secondary | ICD-10-CM | POA: Diagnosis not present

## 2013-10-31 DIAGNOSIS — I2510 Atherosclerotic heart disease of native coronary artery without angina pectoris: Secondary | ICD-10-CM | POA: Diagnosis not present

## 2013-10-31 DIAGNOSIS — E7800 Pure hypercholesterolemia, unspecified: Secondary | ICD-10-CM | POA: Diagnosis not present

## 2013-10-31 DIAGNOSIS — I059 Rheumatic mitral valve disease, unspecified: Secondary | ICD-10-CM | POA: Diagnosis not present

## 2013-10-31 DIAGNOSIS — I10 Essential (primary) hypertension: Secondary | ICD-10-CM | POA: Diagnosis not present

## 2013-11-14 ENCOUNTER — Ambulatory Visit (INDEPENDENT_AMBULATORY_CARE_PROVIDER_SITE_OTHER): Payer: Medicare Other

## 2013-11-14 VITALS — BP 127/68 | HR 82 | Resp 20 | Ht <= 58 in | Wt 155.0 lb

## 2013-11-14 DIAGNOSIS — G609 Hereditary and idiopathic neuropathy, unspecified: Secondary | ICD-10-CM | POA: Diagnosis not present

## 2013-11-14 DIAGNOSIS — Q828 Other specified congenital malformations of skin: Secondary | ICD-10-CM | POA: Diagnosis not present

## 2013-11-14 DIAGNOSIS — M79609 Pain in unspecified limb: Secondary | ICD-10-CM | POA: Diagnosis not present

## 2013-11-14 DIAGNOSIS — B351 Tinea unguium: Secondary | ICD-10-CM | POA: Diagnosis not present

## 2013-11-14 DIAGNOSIS — M2012 Hallux valgus (acquired), left foot: Secondary | ICD-10-CM

## 2013-11-14 DIAGNOSIS — M2010 Hallux valgus (acquired), unspecified foot: Secondary | ICD-10-CM

## 2013-11-14 DIAGNOSIS — G629 Polyneuropathy, unspecified: Secondary | ICD-10-CM

## 2013-11-14 NOTE — Patient Instructions (Signed)

## 2013-11-14 NOTE — Progress Notes (Signed)
   Subjective:    Patient ID: Brittany Weber, female    DOB: Mar 18, 1932, 79 y.o.   MRN: [id number]  "He's going to check my toes."  HPI    Review of Systems deferred at this visit     Objective:   Physical Exam Vascular status is intact as follows DP +2/4 bilateral PT plus one over 4 bilateral. Refill timed 3-4 seconds all digits skin temperature warm turgor normal there is some dry scaling skin no open wounds or ulcerations. Nails thick brittle discolored crumbly and friable 1 through 5 bilateral painful and tender on palpation and with enclosed shoe wear. Patient also has peripheral neuropathy with loss of epicritic and proprioceptive sensations demonstrated loss of feeling in [facility] testing to distal digits plantar forefoot and arch. Neuropathy secondary to lumbar radiculopathy and back arthrosis. There is still keratotic lesion pinch callus of the left great toe joint secondary to rigid digital contractures and hammertoe deformities. As well as notable HAV deformity.       Assessment & Plan:  Assessment this time peripheral neuropathy with cocking factors patient does have onychomycosis. Painful mycotic nails 1 through 5 bilateral debridement at this time also debrided still keratotic lesion pinch callus first MTP area left secondary to HAV deformity and digital contracture and arthrosis. Return for followup and palliative care Sebrena Engh months or as needed  Alvan Dame DPM

## 2013-11-17 DIAGNOSIS — I10 Essential (primary) hypertension: Secondary | ICD-10-CM | POA: Diagnosis not present

## 2013-11-17 DIAGNOSIS — E7800 Pure hypercholesterolemia, unspecified: Secondary | ICD-10-CM | POA: Diagnosis not present

## 2013-12-13 ENCOUNTER — Telehealth: Payer: Self-pay | Admitting: Hematology & Oncology

## 2013-12-13 NOTE — Telephone Encounter (Signed)
Pt called cx 1-8 due to weather. I offered her Friday 1-9 and the next I have would be 1-28. She said she thought that would be to long. Transferred to RN, per RN scheduled 1-14 inj and 1-28 MD

## 2013-12-14 ENCOUNTER — Ambulatory Visit: Payer: Medicare Other | Admitting: Hematology & Oncology

## 2013-12-14 ENCOUNTER — Other Ambulatory Visit: Payer: Medicare Other | Admitting: Lab

## 2013-12-14 ENCOUNTER — Ambulatory Visit: Payer: Medicare Other

## 2013-12-20 ENCOUNTER — Ambulatory Visit: Payer: Medicare Other

## 2013-12-20 ENCOUNTER — Other Ambulatory Visit: Payer: Medicare Other | Admitting: Lab

## 2013-12-21 ENCOUNTER — Other Ambulatory Visit: Payer: Medicare Other | Admitting: Lab

## 2013-12-21 ENCOUNTER — Ambulatory Visit: Payer: Medicare Other

## 2013-12-25 ENCOUNTER — Other Ambulatory Visit (HOSPITAL_BASED_OUTPATIENT_CLINIC_OR_DEPARTMENT_OTHER): Payer: Medicare Other | Admitting: Lab

## 2013-12-25 ENCOUNTER — Ambulatory Visit (HOSPITAL_BASED_OUTPATIENT_CLINIC_OR_DEPARTMENT_OTHER): Payer: Medicare Other

## 2013-12-25 VITALS — BP 114/62 | HR 76 | Temp 99.5°F | Resp 18

## 2013-12-25 DIAGNOSIS — D509 Iron deficiency anemia, unspecified: Secondary | ICD-10-CM | POA: Diagnosis not present

## 2013-12-25 DIAGNOSIS — D649 Anemia, unspecified: Secondary | ICD-10-CM | POA: Diagnosis not present

## 2013-12-25 DIAGNOSIS — D631 Anemia in chronic kidney disease: Secondary | ICD-10-CM | POA: Diagnosis not present

## 2013-12-25 DIAGNOSIS — N289 Disorder of kidney and ureter, unspecified: Secondary | ICD-10-CM

## 2013-12-25 DIAGNOSIS — N189 Chronic kidney disease, unspecified: Principal | ICD-10-CM

## 2013-12-25 LAB — CBC WITH DIFFERENTIAL (CANCER CENTER ONLY)
BASO#: 0.1 10*3/uL (ref 0.0–0.2)
BASO%: 1.1 % (ref 0.0–2.0)
EOS ABS: 0.1 10*3/uL (ref 0.0–0.5)
EOS%: 2.7 % (ref 0.0–7.0)
HCT: 32.7 % — ABNORMAL LOW (ref 34.8–46.6)
HEMOGLOBIN: 10.2 g/dL — AB (ref 11.6–15.9)
LYMPH#: 1.4 10*3/uL (ref 0.9–3.3)
LYMPH%: 31 % (ref 14.0–48.0)
MCH: 30.4 pg (ref 26.0–34.0)
MCHC: 31.2 g/dL — ABNORMAL LOW (ref 32.0–36.0)
MCV: 98 fL (ref 81–101)
MONO#: 0.4 10*3/uL (ref 0.1–0.9)
MONO%: 9 % (ref 0.0–13.0)
NEUT#: 2.5 10*3/uL (ref 1.5–6.5)
NEUT%: 56.2 % (ref 39.6–80.0)
Platelets: 123 10*3/uL — ABNORMAL LOW (ref 145–400)
RBC: 3.35 10*6/uL — AB (ref 3.70–5.32)
RDW: 13 % (ref 11.1–15.7)
WBC: 4.5 10*3/uL (ref 3.9–10.0)

## 2013-12-25 MED ORDER — DARBEPOETIN ALFA-POLYSORBATE 300 MCG/0.6ML IJ SOLN
INTRAMUSCULAR | Status: AC
  Filled 2013-12-25: qty 0.6

## 2013-12-25 MED ORDER — DARBEPOETIN ALFA-POLYSORBATE 300 MCG/0.6ML IJ SOLN
300.0000 ug | Freq: Once | INTRAMUSCULAR | Status: AC
Start: 2013-12-25 — End: 2013-12-25
  Administered 2013-12-25: 300 ug via SUBCUTANEOUS

## 2013-12-25 NOTE — Patient Instructions (Signed)
Darbepoetin Alfa injection What is this medicine? DARBEPOETIN ALFA (dar be POE e tin AL fa) helps your body make more red blood cells. It is used to treat anemia caused by chronic kidney failure and chemotherapy. This medicine may be used for other purposes; ask your health care provider or pharmacist if you have questions. COMMON BRAND NAME(S): Aranesp What should I tell my health care provider before I take this medicine? They need to know if you have any of these conditions: -blood clotting disorders or history of blood clots -cancer patient not on chemotherapy -cystic fibrosis -heart disease, such as angina, heart failure, or a history of a heart attack -hemoglobin level of 12 g/dL or greater -high blood pressure -low levels of folate, iron, or vitamin B12 -seizures -an unusual or allergic reaction to darbepoetin, erythropoietin, albumin, hamster proteins, latex, other medicines, foods, dyes, or preservatives -pregnant or trying to get pregnant -breast-feeding How should I use this medicine? This medicine is for injection into a vein or under the skin. It is usually given by a health care professional in a hospital or clinic setting. If you get this medicine at home, you will be taught how to prepare and give this medicine. Do not shake the solution before you withdraw a dose. Use exactly as directed. Take your medicine at regular intervals. Do not take your medicine more often than directed. It is important that you put your used needles and syringes in a special sharps container. Do not put them in a trash can. If you do not have a sharps container, call your pharmacist or healthcare provider to get one. Talk to your pediatrician regarding the use of this medicine in children. While this medicine may be used in children as young as 1 year for selected conditions, precautions do apply. Overdosage: If you think you have taken too much of this medicine contact a poison control center or  emergency room at once. NOTE: This medicine is only for you. Do not share this medicine with others. What if I miss a dose? If you miss a dose, take it as soon as you can. If it is almost time for your next dose, take only that dose. Do not take double or extra doses. What may interact with this medicine? Do not take this medicine with any of the following medications: -epoetin alfa This list may not describe all possible interactions. Give your health care provider a list of all the medicines, herbs, non-prescription drugs, or dietary supplements you use. Also tell them if you smoke, drink alcohol, or use illegal drugs. Some items may interact with your medicine. What should I watch for while using this medicine? Visit your prescriber or health care professional for regular checks on your progress and for the needed blood tests and blood pressure measurements. It is especially important for the doctor to make sure your hemoglobin level is in the desired range, to limit the risk of potential side effects and to give you the best benefit. Keep all appointments for any recommended tests. Check your blood pressure as directed. Ask your doctor what your blood pressure should be and when you should contact him or her. As your body makes more red blood cells, you may need to take iron, folic acid, or vitamin B supplements. Ask your doctor or health care provider which products are right for you. If you have kidney disease continue dietary restrictions, even though this medication can make you feel better. Talk with your doctor or health   care professional about the foods you eat and the vitamins that you take. What side effects may I notice from receiving this medicine? Side effects that you should report to your doctor or health care professional as soon as possible: -allergic reactions like skin rash, itching or hives, swelling of the face, lips, or tongue -breathing problems -changes in vision -chest  pain -confusion, trouble speaking or understanding -feeling faint or lightheaded, falls -high blood pressure -muscle aches or pains -pain, swelling, warmth in the leg -rapid weight gain -severe headaches -sudden numbness or weakness of the face, arm or leg -trouble walking, dizziness, loss of balance or coordination -seizures (convulsions) -swelling of the ankles, feet, hands -unusually weak or tired Side effects that usually do not require medical attention (report to your doctor or health care professional if they continue or are bothersome): -diarrhea -fever, chills (flu-like symptoms) -headaches -nausea, vomiting -redness, stinging, or swelling at site where injected This list may not describe all possible side effects. Call your doctor for medical advice about side effects. You may report side effects to FDA at 1-800-FDA-1088. Where should I keep my medicine? Keep out of the reach of children. Store in a refrigerator between 2 and 8 degrees C (36 and 46 degrees F). Do not freeze. Do not shake. Throw away any unused portion if using a single-dose vial. Throw away any unused medicine after the expiration date. NOTE: This sheet is a summary. It may not cover all possible information. If you have questions about this medicine, talk to your doctor, pharmacist, or health care provider.  2014, Elsevier/Gold Standard. (2008-11-06 10:23:57)  

## 2013-12-26 DIAGNOSIS — M1710 Unilateral primary osteoarthritis, unspecified knee: Secondary | ICD-10-CM | POA: Diagnosis not present

## 2013-12-29 LAB — TRANSFERRIN RECEPTOR, SOLUABLE: Transferrin Receptor, Soluble: 1.28 mg/L (ref 0.76–1.76)

## 2014-01-03 ENCOUNTER — Encounter: Payer: Self-pay | Admitting: Hematology & Oncology

## 2014-01-03 ENCOUNTER — Other Ambulatory Visit (HOSPITAL_BASED_OUTPATIENT_CLINIC_OR_DEPARTMENT_OTHER): Payer: Medicare Other | Admitting: Lab

## 2014-01-03 ENCOUNTER — Ambulatory Visit (HOSPITAL_BASED_OUTPATIENT_CLINIC_OR_DEPARTMENT_OTHER): Payer: Medicare Other | Admitting: Hematology & Oncology

## 2014-01-03 VITALS — BP 142/59 | HR 74 | Temp 98.5°F | Resp 14 | Ht 64.0 in | Wt 161.0 lb

## 2014-01-03 DIAGNOSIS — N189 Chronic kidney disease, unspecified: Principal | ICD-10-CM

## 2014-01-03 DIAGNOSIS — D509 Iron deficiency anemia, unspecified: Secondary | ICD-10-CM

## 2014-01-03 DIAGNOSIS — D631 Anemia in chronic kidney disease: Secondary | ICD-10-CM

## 2014-01-03 DIAGNOSIS — N289 Disorder of kidney and ureter, unspecified: Secondary | ICD-10-CM | POA: Diagnosis not present

## 2014-01-03 DIAGNOSIS — D649 Anemia, unspecified: Secondary | ICD-10-CM

## 2014-01-03 LAB — CBC WITH DIFFERENTIAL (CANCER CENTER ONLY)
BASO#: 0 10*3/uL (ref 0.0–0.2)
BASO%: 0.4 % (ref 0.0–2.0)
EOS ABS: 0.2 10*3/uL (ref 0.0–0.5)
EOS%: 4 % (ref 0.0–7.0)
HCT: 36.4 % (ref 34.8–46.6)
HGB: 11.2 g/dL — ABNORMAL LOW (ref 11.6–15.9)
LYMPH#: 1.5 10*3/uL (ref 0.9–3.3)
LYMPH%: 31.1 % (ref 14.0–48.0)
MCH: 30.5 pg (ref 26.0–34.0)
MCHC: 30.8 g/dL — ABNORMAL LOW (ref 32.0–36.0)
MCV: 99 fL (ref 81–101)
MONO#: 0.7 10*3/uL (ref 0.1–0.9)
MONO%: 14.7 % — ABNORMAL HIGH (ref 0.0–13.0)
NEUT%: 49.8 % (ref 39.6–80.0)
NEUTROS ABS: 2.5 10*3/uL (ref 1.5–6.5)
PLATELETS: 152 10*3/uL (ref 145–400)
RBC: 3.67 10*6/uL — AB (ref 3.70–5.32)
RDW: 14.1 % (ref 11.1–15.7)
WBC: 5 10*3/uL (ref 3.9–10.0)

## 2014-01-05 NOTE — Progress Notes (Signed)
This office note has been dictated.

## 2014-01-06 NOTE — Progress Notes (Signed)
DIAGNOSES: 1. Anemia of renal insufficiency. 2. Intermittent iron-deficiency anemia.  CURRENT THERAPY: 1. Aranesp 300 mcg subcu as needed for hemoglobin less than 11. 2. IV iron as indicated.  INTERIM HISTORY:  Brittany Weber comes in for followup.  She actually looks little bit better.  The hip does not bother her as much.  She, I think, has a brace on her right leg.  She also has a back brace that she wears.  She has had no obvious bleeding.  There has been no change in bowel or bladder habits.  She does well over the holidays.  When we last saw her, her iron studies showed a ferritin of 1900 with an iron saturation of 55%.  PHYSICAL EXAMINATION:  General:  This is a chronically ill-appearing African American female in no obvious distress.  Vital Signs: Temperature of 98.5, pulse 74, respiratory rate 14, blood pressure is 142/59, weight is 161 pounds.  Head and Neck:  Normocephalic, atraumatic skull.  There are no ocular or oral lesions.  There are no palpable cervical or supraclavicular lymph nodes.  Lungs:  Clear bilaterally. Cardiac:  Regular rate and rhythm with normal S1 and S2.  There are no murmurs, rubs, or bruits.  Abdomen:  Soft.  She has good bowel sounds. There is no palpable abdominal mass.  There is no palpable hepatosplenomegaly.  Extremities:  No clubbing, cyanosis, or edema.  She has chronic brace on her right leg.  Skin:  No rashes, ecchymoses or petechia.  LABORATORY STUDIES:  White cell count is 5, hemoglobin 11.2, hematocrit 36.4, platelet count 152.  IMPRESSION:  Brittany Weber is a 80 year old African American female.  She has multifactorial anemia.  Today, we do not have to give her any Aranesp.  I do not think we have to give her any iron.  We will plan to get her back to see us in another couple of months.    ______________________________ Josph MachoPeter R Ennever, M.D. PRE/MEDQ  D:  01/05/2014  T:  01/06/2014  Job:  [id number]

## 2014-02-06 DIAGNOSIS — E7800 Pure hypercholesterolemia, unspecified: Secondary | ICD-10-CM | POA: Diagnosis not present

## 2014-02-06 DIAGNOSIS — I2510 Atherosclerotic heart disease of native coronary artery without angina pectoris: Secondary | ICD-10-CM | POA: Diagnosis not present

## 2014-02-06 DIAGNOSIS — I059 Rheumatic mitral valve disease, unspecified: Secondary | ICD-10-CM | POA: Diagnosis not present

## 2014-02-06 DIAGNOSIS — I10 Essential (primary) hypertension: Secondary | ICD-10-CM | POA: Diagnosis not present

## 2014-02-20 ENCOUNTER — Ambulatory Visit: Payer: Medicare Other

## 2014-02-26 DIAGNOSIS — M169 Osteoarthritis of hip, unspecified: Secondary | ICD-10-CM | POA: Diagnosis not present

## 2014-02-26 DIAGNOSIS — M1610 Unilateral primary osteoarthritis, unspecified hip: Secondary | ICD-10-CM | POA: Diagnosis not present

## 2014-03-01 ENCOUNTER — Encounter: Payer: Self-pay | Admitting: Hematology & Oncology

## 2014-03-01 ENCOUNTER — Ambulatory Visit (HOSPITAL_BASED_OUTPATIENT_CLINIC_OR_DEPARTMENT_OTHER): Payer: Medicare Other | Admitting: Hematology & Oncology

## 2014-03-01 ENCOUNTER — Other Ambulatory Visit (HOSPITAL_BASED_OUTPATIENT_CLINIC_OR_DEPARTMENT_OTHER): Payer: Medicare Other | Admitting: Lab

## 2014-03-01 VITALS — BP 131/61 | HR 69 | Temp 98.8°F | Resp 14 | Ht 63.0 in | Wt 154.0 lb

## 2014-03-01 DIAGNOSIS — D631 Anemia in chronic kidney disease: Secondary | ICD-10-CM | POA: Diagnosis not present

## 2014-03-01 DIAGNOSIS — D649 Anemia, unspecified: Secondary | ICD-10-CM

## 2014-03-01 DIAGNOSIS — D509 Iron deficiency anemia, unspecified: Secondary | ICD-10-CM

## 2014-03-01 DIAGNOSIS — N039 Chronic nephritic syndrome with unspecified morphologic changes: Secondary | ICD-10-CM

## 2014-03-01 DIAGNOSIS — N189 Chronic kidney disease, unspecified: Principal | ICD-10-CM

## 2014-03-01 LAB — CBC WITH DIFFERENTIAL (CANCER CENTER ONLY)
BASO#: 0 10*3/uL (ref 0.0–0.2)
BASO%: 1 % (ref 0.0–2.0)
EOS%: 3.7 % (ref 0.0–7.0)
Eosinophils Absolute: 0.2 10*3/uL (ref 0.0–0.5)
HCT: 36.8 % (ref 34.8–46.6)
HGB: 12.1 g/dL (ref 11.6–15.9)
LYMPH#: 1.6 10*3/uL (ref 0.9–3.3)
LYMPH%: 38.5 % (ref 14.0–48.0)
MCH: 29.7 pg (ref 26.0–34.0)
MCHC: 32.9 g/dL (ref 32.0–36.0)
MCV: 90 fL (ref 81–101)
MONO#: 0.6 10*3/uL (ref 0.1–0.9)
MONO%: 13.9 % — AB (ref 0.0–13.0)
NEUT#: 1.8 10*3/uL (ref 1.5–6.5)
NEUT%: 42.9 % (ref 39.6–80.0)
PLATELETS: 112 10*3/uL — AB (ref 145–400)
RBC: 4.07 10*6/uL (ref 3.70–5.32)
RDW: 13.7 % (ref 11.1–15.7)
WBC: 4.1 10*3/uL (ref 3.9–10.0)

## 2014-03-01 LAB — RETICULOCYTES (CHCC)
ABS Retic: 28.8 10*3/uL (ref 19.0–186.0)
RBC.: 4.12 MIL/uL (ref 3.87–5.11)
Retic Ct Pct: 0.7 % (ref 0.4–2.3)

## 2014-03-01 LAB — CHCC SATELLITE - SMEAR

## 2014-03-01 NOTE — Progress Notes (Signed)
Hematology and Oncology Follow Up Visit  [name] [id number] 11/09/1932 80 y.o. 03/01/2014   Principle Diagnosis:   Anemia of renal insufficiency  Intermittent iron deficiency anemia  Current Therapy:    Aranesp 300 mcg subcutaneous as needed for hemoglobin less than 11  IV iron as indicated     Interim History:  Ms.  Brittany Weber is back for followup. She's doing okay. She still had a lot of problems with her back and hips however, she still does not want to have any surgery.  We last saw her, showed a transferrin receptor of 1.28.  She's had no bleeding. She had no fever. She's had no cough or shortness of breath. There's been no leg swelling. There's been no rashes.       Medications: Current outpatient prescriptions:alendronate (FOSAMAX) 70 MG/75ML solution, Take 70 mg by mouth every 7 (seven) days. Take with a full glass of water on an empty stomach., Disp: , Rfl: ;  amLODipine-olmesartan (AZOR) 10-40 MG per tablet, Take 1 tablet by mouth daily., Disp: , Rfl: ;  calcium carbonate (OS-CAL) 1250 MG chewable tablet, Chew 1 tablet by mouth daily., Disp: , Rfl:  Cholecalciferol (VITAMIN D-3 PO), Take by mouth every morning., Disp: , Rfl: ;  diclofenac sodium (VOLTAREN) 1 % GEL, Apply topically 4 (four) times daily., Disp: , Rfl: ;  folic acid (FOLVITE) 1 MG tablet, Take 1 tablet (1 mg total) by mouth 2 (two) times daily., Disp: 60 tablet, Rfl: 11;  Misc Natural Products (WHITE WILLOW BARK PO), Take by mouth daily.  , Disp: , Rfl:  vitamin C (ASCORBIC ACID) 500 MG tablet, Take 500 mg by mouth daily. Take 2 po, Disp: , Rfl:   Allergies:  Allergies  Allergen Reactions  . Amoxicillin   . Ampicillin   . Aspirin   . Clarithromycin   . Clarithromycin   . Codeine   . Darvocet [Propoxyphene N-Acetaminophen]   . Demerol   . Diazepam   . Erythromycin   . Indomethacin   . Iron     Pt states she is "allergic to iron pills"- no anaphylaxis to Iron. Tolerates Feraheme IV  .  Latex   . Lisinopril   . Oxycodone   . Sulfa Antibiotics   . Sulfonamide Derivatives   . Valium     Past Medical History, Surgical history, Social history, and Family History were reviewed and updated.  Review of Systems: As above  Physical Exam:  height is 5\' 3"  (1.6 m) and weight is 154 lb (69.854 kg). Her oral temperature is 98.8 F (37.1 C). Her blood pressure is 131/61 and her pulse is 69. Her respiration is 14.   Somewhat elderly African American female. Her head and neck exam shows no adenopathy. No lymph nodes are noted in the neck. Lungs are clear. Cardiac exam regular rate and rhythm. No murmurs are noted. Abdomen soft. There is no palpable liver or spleen. Back exam no tenderness over the spine. Extremities shows a brace on the right leg. She has mild edema in her lower legs. Skin exam no rashes.  Lab Results  Component Value Date   WBC 4.1 03/01/2014   HGB 12.1 03/01/2014   HCT 36.8 03/01/2014   MCV 90 03/01/2014   PLT 112* 03/01/2014     Chemistry   No results found for this basename: NA, K, CL, CO2, BUN, CREATININE, GLU   No results found for this basename: CALCIUM, ALKPHOS, AST, ALT, BILITOT  Impression and Plan: [name] is 80 year old American female. She has multifactorial anemia. Her red cell count is much better today. She does not need any Aranesp or iron.  We will go ahead and plan to get her back down in 2 more months.   [name], MD 3/26/20152:14 PM

## 2014-03-02 LAB — IRON AND TIBC CHCC
%SAT: 38 % (ref 21–57)
IRON: 86 ug/dL (ref 41–142)
TIBC: 228 ug/dL — ABNORMAL LOW (ref 236–444)
UIBC: 141 ug/dL (ref 120–384)

## 2014-03-02 LAB — FERRITIN CHCC

## 2014-03-06 ENCOUNTER — Telehealth: Payer: Self-pay

## 2014-03-06 NOTE — Telephone Encounter (Addendum)
Message copied by Albertha GheeHARRIS, DONNICA P on Tue Mar 06, 2014 11:56 AM ------      Message from: Arlan OrganENNEVER, PETER R      Created: Mon Mar 05, 2014 10:32 PM       Call - iron is ok.  Pete ------  Pt notified by phone of above lab results. dph

## 2014-03-08 DIAGNOSIS — H35369 Drusen (degenerative) of macula, unspecified eye: Secondary | ICD-10-CM | POA: Diagnosis not present

## 2014-03-08 DIAGNOSIS — H2510 Age-related nuclear cataract, unspecified eye: Secondary | ICD-10-CM | POA: Diagnosis not present

## 2014-03-08 DIAGNOSIS — H25019 Cortical age-related cataract, unspecified eye: Secondary | ICD-10-CM | POA: Diagnosis not present

## 2014-03-08 DIAGNOSIS — H40029 Open angle with borderline findings, high risk, unspecified eye: Secondary | ICD-10-CM | POA: Diagnosis not present

## 2014-03-13 ENCOUNTER — Ambulatory Visit: Payer: Medicare Other

## 2014-03-16 ENCOUNTER — Ambulatory Visit: Payer: Medicare Other

## 2014-03-26 ENCOUNTER — Ambulatory Visit: Payer: Medicare Other

## 2014-03-28 ENCOUNTER — Ambulatory Visit (INDEPENDENT_AMBULATORY_CARE_PROVIDER_SITE_OTHER): Payer: Medicare Other

## 2014-03-28 VITALS — BP 119/59 | HR 81 | Resp 18 | Ht <= 58 in | Wt 154.0 lb

## 2014-03-28 DIAGNOSIS — Q828 Other specified congenital malformations of skin: Secondary | ICD-10-CM

## 2014-03-28 DIAGNOSIS — M79609 Pain in unspecified limb: Secondary | ICD-10-CM

## 2014-03-28 DIAGNOSIS — B351 Tinea unguium: Secondary | ICD-10-CM

## 2014-03-28 DIAGNOSIS — M2010 Hallux valgus (acquired), unspecified foot: Secondary | ICD-10-CM

## 2014-03-28 NOTE — Progress Notes (Signed)
   Subjective:    Patient ID: Brittany Weber, female    DOB: April 01, 1932, 80 y.o.   MRN: [id number]  HPI Comments: Pt presents for the debridement of 1 - 10 toenails and diabetic foot evaluation.     Review of Systems no new systemic changes or findings to     Objective:   Physical Exam 80 year old female presents this time for followup nail care and palliative care well-developed well-nourished oriented x3 he walks with the assistance of a walker. Patient presents this time lower extremity findings DP +2/4 PT one over 4 bilateral mild +1 edema noted neurologically epicritic and proprioceptive sensations intact and posse somewhat diminished there is normal plantar response DTRs not listed nails thick hypertrophic friable gratified and discolored brittle and darkened discolored 1 through 5 bilateral painful segment with enclosed shoes ambulation at the past treated topically however discontinued be consistent with a working with her at this time she can't reach of toenails patient does have posse some mild neuropathy secondary to low lumbar radiculopathy and back arthrosis otherwise walks with the assistance of a walker of motor function is no thenar intact although don't       Assessment & Plan:  Assessment this time is onychomycosis painful mycotic nails 1 through 5 bilateral debridement at this time the presence of pain in symptomology and proptosis return for future palliative care is needed suggest 3 month followup also did debride some mild pinch callus hallux bilateral and pinch callus of the fifth MTP or the fifth toe bilateral. Return for future palliative care is needed  Standard [place] Vasilia Dise DP

## 2014-03-28 NOTE — Patient Instructions (Signed)
Onychomycosis/Fungal Toenails  WHAT IS IT? An infection that lies within the keratin of your nail plate that is caused by a fungus.  WHY ME? Fungal infections affect all ages, sexes, races, and creeds.  There may be many factors that predispose you to a fungal infection such as age, coexisting medical conditions such as diabetes, or an autoimmune disease; stress, medications, fatigue, genetics, etc.  Bottom line: fungus thrives in a warm, moist environment and your shoes offer such a location.  IS IT CONTAGIOUS? Theoretically, yes.  You do not want to share shoes, nail clippers or files with someone who has fungal toenails.  Walking around barefoot in the same room or sleeping in the same bed is unlikely to transfer the organism.  It is important to realize, however, that fungus can spread easily from one nail to the next on the same foot.  HOW DO WE TREAT THIS?  There are several ways to treat this condition.  Treatment may depend on many factors such as age, medications, pregnancy, liver and kidney conditions, etc.  It is best to ask your doctor which options are available to you.  1. No treatment.   Unlike many other medical concerns, you can live with this condition.  However for many people this can be a painful condition and may lead to ingrown toenails or a bacterial infection.  It is recommended that you keep the nails cut short to help reduce the amount of fungal nail. 2. Topical treatment.  These range from herbal remedies to prescription strength nail lacquers.  About 40-50% effective, topicals require twice daily application for approximately 9 to 12 months or until an entirely new nail has grown out.  The most effective topicals are medical grade medications available through physicians offices. 3. Oral antifungal medications.  With an 80-90% cure rate, the most common oral medication requires 3 to 4 months of therapy and stays in your system for a year as the new nail grows out.  Oral  antifungal medications do require blood work to make sure it is a safe drug for you.  A liver function panel will be performed prior to starting the medication and after the first month of treatment.  It is important to have the blood work performed to avoid any harmful side effects.  In general, this medication safe but blood work is required. 4. Laser Therapy.  This treatment is performed by applying a specialized laser to the affected nail plate.  This therapy is noninvasive, fast, and non-painful.  It is not covered by insurance and is therefore, out of pocket.  The results have been very good with a 80-95% cure rate.  The [facility] is the only practice in the area to offer this therapy. 5. Permanent Nail Avulsion.  Removing the entire nail so that a new nail will not grow back.  Apply Fungi-Nail to affected nails twice daily for 12+ months

## 2014-04-25 DIAGNOSIS — M1610 Unilateral primary osteoarthritis, unspecified hip: Secondary | ICD-10-CM | POA: Diagnosis not present

## 2014-04-25 DIAGNOSIS — M169 Osteoarthritis of hip, unspecified: Secondary | ICD-10-CM | POA: Diagnosis not present

## 2014-05-02 ENCOUNTER — Other Ambulatory Visit (HOSPITAL_BASED_OUTPATIENT_CLINIC_OR_DEPARTMENT_OTHER): Payer: Medicare Other | Admitting: Lab

## 2014-05-02 ENCOUNTER — Ambulatory Visit (HOSPITAL_BASED_OUTPATIENT_CLINIC_OR_DEPARTMENT_OTHER): Payer: Medicare Other | Admitting: Hematology & Oncology

## 2014-05-02 ENCOUNTER — Encounter: Payer: Self-pay | Admitting: Hematology & Oncology

## 2014-05-02 ENCOUNTER — Ambulatory Visit: Payer: Medicare Other

## 2014-05-02 ENCOUNTER — Telehealth: Payer: Self-pay | Admitting: Hematology & Oncology

## 2014-05-02 VITALS — BP 125/54 | HR 58 | Temp 98.6°F | Resp 14 | Ht 60.0 in | Wt 158.0 lb

## 2014-05-02 DIAGNOSIS — D649 Anemia, unspecified: Secondary | ICD-10-CM

## 2014-05-02 DIAGNOSIS — N189 Chronic kidney disease, unspecified: Secondary | ICD-10-CM

## 2014-05-02 DIAGNOSIS — N289 Disorder of kidney and ureter, unspecified: Secondary | ICD-10-CM

## 2014-05-02 DIAGNOSIS — D631 Anemia in chronic kidney disease: Secondary | ICD-10-CM

## 2014-05-02 DIAGNOSIS — D509 Iron deficiency anemia, unspecified: Secondary | ICD-10-CM

## 2014-05-02 LAB — RETICULOCYTES (CHCC)
ABS Retic: 41.6 10*3/uL (ref 19.0–186.0)
RBC.: 3.78 MIL/uL — AB (ref 3.87–5.11)
RETIC CT PCT: 1.1 % (ref 0.4–2.3)

## 2014-05-02 LAB — CBC WITH DIFFERENTIAL (CANCER CENTER ONLY)
BASO#: 0.1 10*3/uL (ref 0.0–0.2)
BASO%: 1 % (ref 0.0–2.0)
EOS%: 2.7 % (ref 0.0–7.0)
Eosinophils Absolute: 0.1 10*3/uL (ref 0.0–0.5)
HCT: 34.9 % (ref 34.8–46.6)
HGB: 11.3 g/dL — ABNORMAL LOW (ref 11.6–15.9)
LYMPH#: 1.7 10*3/uL (ref 0.9–3.3)
LYMPH%: 33.2 % (ref 14.0–48.0)
MCH: 29.7 pg (ref 26.0–34.0)
MCHC: 32.4 g/dL (ref 32.0–36.0)
MCV: 92 fL (ref 81–101)
MONO#: 0.6 10*3/uL (ref 0.1–0.9)
MONO%: 10.7 % (ref 0.0–13.0)
NEUT%: 52.4 % (ref 39.6–80.0)
NEUTROS ABS: 2.8 10*3/uL (ref 1.5–6.5)
Platelets: 137 10*3/uL — ABNORMAL LOW (ref 145–400)
RBC: 3.8 10*6/uL (ref 3.70–5.32)
RDW: 15.3 % (ref 11.1–15.7)
WBC: 5.2 10*3/uL (ref 3.9–10.0)

## 2014-05-02 NOTE — Progress Notes (Signed)
Hematology and Oncology Follow Up Visit  Brittany Weber [id number] 02/28/1932 80 y.o. 05/02/2014   Principle Diagnosis:   Anemia of renal insufficiency  Intermittent iron deficiency anemia  Current Therapy:    Aranesp 300 mcg subcutaneous as needed for hemoglobin less than 11  IV iron as indicated     Interim History:  Ms.  Weber is back for followup. She's doing okay. She still had a lot of problems with her back and hips however, she still does not want to have any surgery.  We last saw her, her ferritin was 1323. Her iron saturation was 38%. She's had no bleeding. She had no fever. She's had no cough or shortness of breath. There's been no leg swelling. There's been no rashes.       Medications: Current outpatient prescriptions:alendronate (FOSAMAX) 70 MG/75ML solution, Take 70 mg by mouth every 7 (seven) days. Take with a full glass of water on an empty stomach., Disp: , Rfl: ;  amLODipine-olmesartan (AZOR) 10-40 MG per tablet, Take 1 tablet by mouth daily., Disp: , Rfl: ;  calcium carbonate (OS-CAL) 1250 MG chewable tablet, Chew 1 tablet by mouth daily. Pt takes Calcrate, Disp: , Rfl:  Cholecalciferol (VITAMIN D-3 PO), Take by mouth every morning., Disp: , Rfl: ;  diclofenac sodium (VOLTAREN) 1 % GEL, Apply topically 4 (four) times daily., Disp: , Rfl: ;  folic acid (FOLVITE) 1 MG tablet, Take 1 tablet (1 mg total) by mouth 2 (two) times daily., Disp: 60 tablet, Rfl: 11;  Misc Natural Products (WHITE WILLOW BARK PO), Take by mouth daily. " Pt states that white willow bark is natural aspirin ", Disp: , Rfl:  vitamin C (ASCORBIC ACID) 500 MG tablet, Take 500 mg by mouth daily. Take 2 po, Disp: , Rfl:   Allergies:  Allergies  Allergen Reactions  . Amoxicillin   . Ampicillin   . Aspirin   . Clarithromycin   . Clarithromycin   . Codeine   . Darvocet [Propoxyphene N-Acetaminophen]   . Demerol   . Diazepam   . Erythromycin   . Indomethacin   . Iron     Pt states she  is "allergic to iron pills"- no anaphylaxis to Iron. Tolerates Feraheme IV  . Latex   . Lisinopril   . Oxycodone   . Sulfa Antibiotics   . Sulfonamide Derivatives   . Valium     Past Medical History, Surgical history, Social history, and Family History were reviewed and updated.  Review of Systems: As above  Physical Exam:  height is 5' (1.524 m) and weight is 158 lb (71.668 kg). Her oral temperature is 98.6 F (37 C). Her blood pressure is 125/54 and her pulse is 58. Her respiration is 14.   Somewhat elderly African American female. Her head and neck exam shows no adenopathy. No lymph nodes are noted in the neck. Lungs are clear. Cardiac exam regular rate and rhythm. No murmurs are noted. Abdomen soft. There is no palpable liver or spleen. Back exam no tenderness over the spine. Extremities shows a brace on the right leg. She has mild edema in her lower legs. Skin exam no rashes.  Lab Results  Component Value Date   WBC 5.2 05/02/2014   HGB 11.3* 05/02/2014   HCT 34.9 05/02/2014   MCV 92 05/02/2014   PLT 137* 05/02/2014     Chemistry   No results found for this basename: NA,  K,  CL,  CO2,  BUN,  CREATININE,  GLU  No results found for this basename: CALCIUM,  ALKPHOS,  AST,  ALT,  BILITOT         Impression and Plan: Brittany Weber is 80 year old American female. She has multifactorial anemia. Her hemoglobin is holding relatively steady. She will not need any Aranesp. I am glad that the blood count has come back a couple of little bit.  We will see her iron studies show. Her ferritin is mostly an acute phase reactant from her arthritic issues.  We last saw her, her reticulocyte count was only 0.7 so again her marrow is definitely  in need of " activation". I will plan to get her back down in 2 more months.   Josph MachoPeter R Normon Pettijohn, MD 5/27/20152:34 PM

## 2014-05-02 NOTE — Telephone Encounter (Signed)
Mailed July schedule °

## 2014-05-03 LAB — IRON AND TIBC CHCC
%SAT: 33 % (ref 21–57)
IRON: 78 ug/dL (ref 41–142)
TIBC: 238 ug/dL (ref 236–444)
UIBC: 160 ug/dL (ref 120–384)

## 2014-05-03 LAB — FERRITIN CHCC: Ferritin: 1749 ng/ml — ABNORMAL HIGH (ref 9–269)

## 2014-05-04 DIAGNOSIS — M1990 Unspecified osteoarthritis, unspecified site: Secondary | ICD-10-CM | POA: Diagnosis not present

## 2014-05-04 DIAGNOSIS — E559 Vitamin D deficiency, unspecified: Secondary | ICD-10-CM | POA: Diagnosis not present

## 2014-05-04 DIAGNOSIS — Z136 Encounter for screening for cardiovascular disorders: Secondary | ICD-10-CM | POA: Diagnosis not present

## 2014-05-04 DIAGNOSIS — Z0000 Encounter for general adult medical examination without abnormal findings: Secondary | ICD-10-CM | POA: Diagnosis not present

## 2014-05-10 DIAGNOSIS — I059 Rheumatic mitral valve disease, unspecified: Secondary | ICD-10-CM | POA: Diagnosis not present

## 2014-05-10 DIAGNOSIS — N189 Chronic kidney disease, unspecified: Secondary | ICD-10-CM | POA: Diagnosis not present

## 2014-05-10 DIAGNOSIS — I10 Essential (primary) hypertension: Secondary | ICD-10-CM | POA: Diagnosis not present

## 2014-05-10 DIAGNOSIS — I2510 Atherosclerotic heart disease of native coronary artery without angina pectoris: Secondary | ICD-10-CM | POA: Diagnosis not present

## 2014-05-10 DIAGNOSIS — K219 Gastro-esophageal reflux disease without esophagitis: Secondary | ICD-10-CM | POA: Diagnosis not present

## 2014-05-10 DIAGNOSIS — M159 Polyosteoarthritis, unspecified: Secondary | ICD-10-CM | POA: Diagnosis not present

## 2014-05-10 DIAGNOSIS — E785 Hyperlipidemia, unspecified: Secondary | ICD-10-CM | POA: Diagnosis not present

## 2014-05-10 DIAGNOSIS — E669 Obesity, unspecified: Secondary | ICD-10-CM | POA: Diagnosis not present

## 2014-05-18 DIAGNOSIS — Z803 Family history of malignant neoplasm of breast: Secondary | ICD-10-CM | POA: Diagnosis not present

## 2014-05-18 DIAGNOSIS — Z1231 Encounter for screening mammogram for malignant neoplasm of breast: Secondary | ICD-10-CM | POA: Diagnosis not present

## 2014-06-27 DIAGNOSIS — M1710 Unilateral primary osteoarthritis, unspecified knee: Secondary | ICD-10-CM | POA: Diagnosis not present

## 2014-07-03 ENCOUNTER — Ambulatory Visit (INDEPENDENT_AMBULATORY_CARE_PROVIDER_SITE_OTHER): Payer: Medicare Other

## 2014-07-03 DIAGNOSIS — M2010 Hallux valgus (acquired), unspecified foot: Secondary | ICD-10-CM

## 2014-07-03 DIAGNOSIS — G609 Hereditary and idiopathic neuropathy, unspecified: Secondary | ICD-10-CM

## 2014-07-03 DIAGNOSIS — Q828 Other specified congenital malformations of skin: Secondary | ICD-10-CM

## 2014-07-03 DIAGNOSIS — M79609 Pain in unspecified limb: Secondary | ICD-10-CM | POA: Diagnosis not present

## 2014-07-03 DIAGNOSIS — G629 Polyneuropathy, unspecified: Secondary | ICD-10-CM

## 2014-07-03 DIAGNOSIS — M79606 Pain in leg, unspecified: Secondary | ICD-10-CM

## 2014-07-03 DIAGNOSIS — B351 Tinea unguium: Secondary | ICD-10-CM

## 2014-07-03 NOTE — Progress Notes (Signed)
   Subjective:    Patient ID: Brittany Weber, female    DOB: 1932-11-11, 80 y.o.   MRN: [id number]  HPI Pt presents for debridement   Review of Systems no new findings or systemic changes noted     Objective:   Physical Exam Neurovascular status is intact with pedal pulses palpable DP +2/4 bilateral PT one over 4 bilateral mild +1 edema noted neurologically epicritic and proprioceptive sensations intact although diminished distally to the toes and forefoot with some paresthesias abnormal sensations noted at times. Nails thick brittle crumbly friable discolored the right hallux nail plate is loosened from the nailbed with some separation no signs of secondary infection open wounds or ulcers noted there is pinch callus of left hallux noted. History of peripheral neuropathy or lumbar radiculopathy noted contributing to abnormality gait and loss of sensation.       Assessment & Plan:  Assessment this time history peripheral neuropathy onychomycosis painful mycotic nails debrided 1 through 5 bilateral Neosporin applied to the right hallux nail that following partial avulsion of nail plate which is loosened and separated friable reappointed 3 months for followup continued palliative nail care keratoses pinch callus of the left hallux also carried at this time recheck in 3 months next  Alvan Dameichard Jeanett Antonopoulos DPM

## 2014-07-03 NOTE — Patient Instructions (Signed)

## 2014-07-04 ENCOUNTER — Ambulatory Visit (HOSPITAL_BASED_OUTPATIENT_CLINIC_OR_DEPARTMENT_OTHER): Payer: Medicare Other | Admitting: Family

## 2014-07-04 ENCOUNTER — Other Ambulatory Visit (HOSPITAL_BASED_OUTPATIENT_CLINIC_OR_DEPARTMENT_OTHER): Payer: Medicare Other | Admitting: Lab

## 2014-07-04 ENCOUNTER — Ambulatory Visit: Payer: Medicare Other

## 2014-07-04 VITALS — BP 136/52 | HR 79 | Temp 98.6°F | Resp 14 | Wt 160.0 lb

## 2014-07-04 DIAGNOSIS — D631 Anemia in chronic kidney disease: Secondary | ICD-10-CM

## 2014-07-04 DIAGNOSIS — N189 Chronic kidney disease, unspecified: Secondary | ICD-10-CM

## 2014-07-04 DIAGNOSIS — D509 Iron deficiency anemia, unspecified: Secondary | ICD-10-CM

## 2014-07-04 LAB — CBC WITH DIFFERENTIAL (CANCER CENTER ONLY)
BASO#: 0 10e3/uL (ref 0.0–0.2)
BASO%: 0.7 % (ref 0.0–2.0)
EOS%: 3.7 % (ref 0.0–7.0)
Eosinophils Absolute: 0.2 10e3/uL (ref 0.0–0.5)
HCT: 34.5 % — ABNORMAL LOW (ref 34.8–46.6)
HGB: 11.2 g/dL — ABNORMAL LOW (ref 11.6–15.9)
LYMPH#: 1.2 10e3/uL (ref 0.9–3.3)
LYMPH%: 26.5 % (ref 14.0–48.0)
MCH: 30.5 pg (ref 26.0–34.0)
MCHC: 32.5 g/dL (ref 32.0–36.0)
MCV: 94 fL (ref 81–101)
MONO#: 0.5 10e3/uL (ref 0.1–0.9)
MONO%: 10.4 % (ref 0.0–13.0)
NEUT#: 2.7 10e3/uL (ref 1.5–6.5)
NEUT%: 58.7 % (ref 39.6–80.0)
Platelets: 140 10e3/uL — ABNORMAL LOW (ref 145–400)
RBC: 3.67 10e6/uL — ABNORMAL LOW (ref 3.70–5.32)
RDW: 12.9 % (ref 11.1–15.7)
WBC: 4.6 10e3/uL (ref 3.9–10.0)

## 2014-07-04 LAB — RETICULOCYTES (CHCC)
ABS Retic: 40.7 K/uL (ref 19.0–186.0)
RBC.: 3.7 MIL/uL — ABNORMAL LOW (ref 3.87–5.11)
Retic Ct Pct: 1.1 % (ref 0.4–2.3)

## 2014-07-04 LAB — IRON AND TIBC CHCC
%SAT: 30 % (ref 21–57)
IRON: 76 ug/dL (ref 41–142)
TIBC: 250 ug/dL (ref 236–444)
UIBC: 174 ug/dL (ref 120–384)

## 2014-07-04 LAB — FERRITIN CHCC: Ferritin: 1765 ng/mL — ABNORMAL HIGH (ref 9–269)

## 2014-07-04 NOTE — Progress Notes (Signed)
Patient comes in today, CBC checked, Aranasp injection not given due to parameters.  Hgb was 11.2

## 2014-07-04 NOTE — Progress Notes (Signed)
[facility]  [phone] [phone]) [phone]  ID: Brittany Weber OB: 1932/04/06 MR#: [id number] JYN#:[id number] Patient Care Team: [name], NP as PCP - General  DIAGNOSIS: Anemia of renal insufficiency Intermittent iron deficiency anemia  INTERVAL HISTORY: Brittany Weber is back for her 2 month follow-up. She is doing good. She states that her energy level is a bit down. She is still having a lot of problems with her back and hips but still does not want to have any surgery. She uses a walker to get around. In May, her iron saturation was 33% and her ferritin was 1,749. She's had no bleeding. She denies cough, rash, fever, chills, n/v, headache, dizziness, SOB, chest pain, palpitations, abdominal pain, constipation, diarrhea, blood in urine or stool. She denies swelling, tenderness, numbness or tingling in her extremities. Her appetite is good.   CURRENT TREATMENT: Aranesp 300 mcg subcutaneous as needed for hemoglobin less than 11 IV iron as indicated  REVIEW OF SYSTEMS: All other 10 point review of systems is negative.   PAST MEDICAL HISTORY: Past Medical History  Diagnosis Date  . Anemia of renal disease 10/22/2011  . Anemia, iron deficiency 10/22/2011  . Cataract    PAST SURGICAL HISTORY: Past Surgical History  Procedure Laterality Date  . Abdominal hysterectomy    . Appendectomy     FAMILY HISTORY Family History  Problem Relation Age of Onset  . Diabetes Father   . Diabetes Mother   . Kidney disease Mother    GYNECOLOGIC HISTORY:  No LMP recorded. Patient has had a hysterectomy.   SOCIAL HISTORY:  History   Social History  . Marital Status: Divorced    Spouse Name: N/A    Number of Children: N/A  . Years of Education: N/A   Occupational History  . Not on file.   Social History Main Topics  . Smoking status: Never Smoker   . Smokeless tobacco: Never Used     Comment: never used tobacco  . Alcohol Use: No  . Drug Use: No  .  Sexual Activity: Not on file   Other Topics Concern  . Not on file   Social History Narrative  . No narrative on file    ADVANCED DIRECTIVES: <no information>  HEALTH MAINTENANCE: History  Substance Use Topics  . Smoking status: Never Smoker   . Smokeless tobacco: Never Used     Comment: never used tobacco  . Alcohol Use: No   Colonoscopy: PAP: Bone density: Lipid panel:  Allergies  Allergen Reactions  . Amoxicillin   . Ampicillin   . Aspirin   . Clarithromycin   . Clarithromycin   . Codeine   . Darvocet [Propoxyphene N-Acetaminophen]   . Demerol   . Diazepam   . Erythromycin   . Indomethacin   . Iron     Pt states she is "allergic to iron pills"- no anaphylaxis to Iron. Tolerates Feraheme IV  . Latex   . Lisinopril   . Oxycodone   . Sulfa Antibiotics   . Sulfonamide Derivatives   . Valium    Current Outpatient Prescriptions  Medication Sig Dispense Refill  . alendronate (FOSAMAX) 70 MG/75ML solution Take 70 mg by mouth every 7 (seven) days. Take with a full glass of water on an empty stomach.      Marland Kitchen amLODipine-olmesartan (AZOR) 10-40 MG per tablet Take 1 tablet by mouth daily.      . calcium carbonate (OS-CAL) 1250 MG chewable tablet Chew 1 tablet by mouth  daily. Pt takes Calcrate      . Cholecalciferol (VITAMIN D-3 PO) Take by mouth every morning.      . diclofenac sodium (VOLTAREN) 1 % GEL Apply topically 4 (four) times daily.      . folic acid (FOLVITE) 1 MG tablet Take 1 tablet (1 mg total) by mouth 2 (two) times daily.  60 tablet  11  . Misc Natural Products (WHITE WILLOW BARK PO) Take by mouth daily. " Pt states that white willow bark is natural aspirin "      . vitamin C (ASCORBIC ACID) 500 MG tablet Take 500 mg by mouth daily. Take 2 po       No current facility-administered medications for this visit.   OBJECTIVE: Filed Vitals:   07/04/14 1100  BP: 136/52  Pulse: 79  Temp: 98.6 F (37 C)  Resp: 14   Body mass index is 31.25 kg/(m^2). ECOG  FS:0 - Asymptomatic Ocular: Sclerae unicteric, pupils equal, round and reactive to light Ear-nose-throat: Oropharynx clear, dentition fair Lymphatic: No cervical or supraclavicular adenopathy Lungs no rales or rhonchi, good excursion bilaterally Heart regular rate and rhythm, no murmur appreciated Abd soft, nontender, positive bowel sounds MSK no focal spinal tenderness, no joint edema Neuro: non-focal, well-oriented, appropriate affect Breasts: Deferred  LAB RESULTS: CMP  No results found for this basename: na, k, cl, co2, glucose, bun, creatinine, calcium, prot, albumin, ast, alt, alkphos, bilitot, gfrnonaa, gfraa   No results found for this basename: SPEP, UPEP,  kappa and lambda light chains   Lab Results  Component Value Date   WBC 4.6 07/04/2014   NEUTROABS 2.7 07/04/2014   HGB 11.2* 07/04/2014   HCT 34.5* 07/04/2014   MCV 94 07/04/2014   PLT 140* 07/04/2014   No results found for this basename: LABCA2   No components found with this basename: LABCA125   No results found for this basename: INR,  in the last 168 hours Urinalysis No results found for this basename: colorurine, appearanceur, labspec, phurine, glucoseu, hgbur, bilirubinur, ketonesur, proteinur, urobilinogen, nitrite, leukocytesur   STUDIES: No results found.  ASSESSMENT/PLAN: Brittany Weber is 80 year old American female with multifactorial anemia. Her hemoglobin is holding relatively steady. She will not need any Aranesp today. We will wait and see what her other labs show.  She is asymptomatic at this time.  We will see her back in 2 months for labs and follow-up.  All questions were answered and she is in agreement with the plan. She knows to call here with any questions or concerns and to go to the ED in the event of an emergency. We can certainly see her sooner if need be.   Verdie Mosher[place]INNAT[place],Svara Twyman M, NP 07/04/2014 12:33 PM

## 2014-08-10 DIAGNOSIS — E669 Obesity, unspecified: Secondary | ICD-10-CM | POA: Diagnosis not present

## 2014-08-10 DIAGNOSIS — E559 Vitamin D deficiency, unspecified: Secondary | ICD-10-CM | POA: Diagnosis not present

## 2014-08-10 DIAGNOSIS — E785 Hyperlipidemia, unspecified: Secondary | ICD-10-CM | POA: Diagnosis not present

## 2014-08-10 DIAGNOSIS — I059 Rheumatic mitral valve disease, unspecified: Secondary | ICD-10-CM | POA: Diagnosis not present

## 2014-08-10 DIAGNOSIS — M159 Polyosteoarthritis, unspecified: Secondary | ICD-10-CM | POA: Diagnosis not present

## 2014-08-10 DIAGNOSIS — I10 Essential (primary) hypertension: Secondary | ICD-10-CM | POA: Diagnosis not present

## 2014-08-10 DIAGNOSIS — I2510 Atherosclerotic heart disease of native coronary artery without angina pectoris: Secondary | ICD-10-CM | POA: Diagnosis not present

## 2014-09-03 ENCOUNTER — Other Ambulatory Visit (HOSPITAL_BASED_OUTPATIENT_CLINIC_OR_DEPARTMENT_OTHER): Payer: Medicare Other | Admitting: Lab

## 2014-09-03 ENCOUNTER — Ambulatory Visit (HOSPITAL_BASED_OUTPATIENT_CLINIC_OR_DEPARTMENT_OTHER): Payer: Medicare Other | Admitting: Family

## 2014-09-03 ENCOUNTER — Encounter: Payer: Self-pay | Admitting: Family

## 2014-09-03 VITALS — BP 124/46 | HR 66 | Temp 98.4°F | Resp 14 | Ht 60.0 in | Wt 158.0 lb

## 2014-09-03 DIAGNOSIS — D649 Anemia, unspecified: Secondary | ICD-10-CM | POA: Diagnosis not present

## 2014-09-03 DIAGNOSIS — D509 Iron deficiency anemia, unspecified: Secondary | ICD-10-CM

## 2014-09-03 DIAGNOSIS — N039 Chronic nephritic syndrome with unspecified morphologic changes: Secondary | ICD-10-CM

## 2014-09-03 DIAGNOSIS — D631 Anemia in chronic kidney disease: Secondary | ICD-10-CM | POA: Diagnosis not present

## 2014-09-03 LAB — FERRITIN CHCC: Ferritin: 1585 ng/ml — ABNORMAL HIGH (ref 9–269)

## 2014-09-03 LAB — CBC WITH DIFFERENTIAL (CANCER CENTER ONLY)
BASO#: 0 10*3/uL (ref 0.0–0.2)
BASO%: 0.4 % (ref 0.0–2.0)
EOS%: 2.2 % (ref 0.0–7.0)
Eosinophils Absolute: 0.1 10*3/uL (ref 0.0–0.5)
HCT: 35.8 % (ref 34.8–46.6)
HGB: 11.6 g/dL (ref 11.6–15.9)
LYMPH#: 1.5 10*3/uL (ref 0.9–3.3)
LYMPH%: 33.4 % (ref 14.0–48.0)
MCH: 29.8 pg (ref 26.0–34.0)
MCHC: 32.4 g/dL (ref 32.0–36.0)
MCV: 92 fL (ref 81–101)
MONO#: 0.4 10*3/uL (ref 0.1–0.9)
MONO%: 9.2 % (ref 0.0–13.0)
NEUT%: 54.8 % (ref 39.6–80.0)
NEUTROS ABS: 2.5 10*3/uL (ref 1.5–6.5)
Platelets: 153 10*3/uL (ref 145–400)
RBC: 3.89 10*6/uL (ref 3.70–5.32)
RDW: 13.5 % (ref 11.1–15.7)
WBC: 4.6 10*3/uL (ref 3.9–10.0)

## 2014-09-03 LAB — IRON AND TIBC CHCC
%SAT: 27 % (ref 21–57)
Iron: 63 ug/dL (ref 41–142)
TIBC: 238 ug/dL (ref 236–444)
UIBC: 174 ug/dL (ref 120–384)

## 2014-09-03 LAB — RETICULOCYTES (CHCC)
ABS RETIC: 39.3 10*3/uL (ref 19.0–186.0)
RBC.: 3.93 MIL/uL (ref 3.87–5.11)
RETIC CT PCT: 1 % (ref 0.4–2.3)

## 2014-09-03 NOTE — Progress Notes (Signed)
[facility]  [phone] [phone]  ID: Brittany Weber OB: [birth year] MR#: [id number]#:[id number] Patient Care Team: [name], NP as PCP - General  DIAGNOSIS: Anemia of renal insufficiency Intermittent iron deficiency anemia  INTERVAL HISTORY: Brittany Weber is back for a follow-up. She is doing ok. Her energy is down. She has been tired and napping during the day more. She is still having a lot of problems with her back and hips but still does not want to have any surgery. She uses a walker to get around. In July, her iron saturation was 30% and her ferritin was 1,765. She's had no bleeding. She denies cough, rash, fever, chills, n/v, headache, dizziness, SOB, chest pain, palpitations, abdominal pain, constipation, diarrhea, blood in urine or stool. She denies swelling, tenderness, numbness or tingling in her extremities. Her appetite is good and she is drinking plenty of fluids.    CURRENT TREATMENT: Aranesp 300 mcg subcutaneous as needed for hemoglobin less than 11  IV iron as indicated  REVIEW OF SYSTEMS: All other 10 point review of systems is negative except for those issues mentioned above.   PAST MEDICAL HISTORY: Past Medical History  Diagnosis Date  . Anemia of renal disease 10/22/2011  . Anemia, iron deficiency 10/22/2011  . Cataract    PAST SURGICAL HISTORY: Past Surgical History  Procedure Laterality Date  . Abdominal hysterectomy    . Appendectomy     FAMILY HISTORY Family History  Problem Relation Age of Onset  . Diabetes Father   . Diabetes Mother   . Kidney disease Mother    GYNECOLOGIC HISTORY:  No LMP recorded. Patient has had a hysterectomy.   SOCIAL HISTORY:  History   Social History  . Marital Status: Divorced    Spouse Name: N/A    Number of Children: N/A  . Years of Education: N/A   Occupational History  . Not on file.   Social History Main Topics  . Smoking status: Never Smoker   . Smokeless  tobacco: Never Used     Comment: never used tobacco  . Alcohol Use: No  . Drug Use: No  . Sexual Activity: Not on file   Other Topics Concern  . Not on file   Social History Narrative  . No narrative on file   ADVANCED DIRECTIVES: <no information>  HEALTH MAINTENANCE: History  Substance Use Topics  . Smoking status: Never Smoker   . Smokeless tobacco: Never Used     Comment: never used tobacco  . Alcohol Use: No   Colonoscopy: PAP: Bone density: Lipid panel:  Allergies  Allergen Reactions  . Amoxicillin   . Ampicillin   . Aspirin   . Clarithromycin   . Clarithromycin   . Codeine   . Darvocet [Propoxyphene N-Acetaminophen]   . Demerol   . Diazepam   . Erythromycin   . Indomethacin   . Iron     Pt states she is "allergic to iron pills"- no anaphylaxis to Iron. Tolerates Feraheme IV  . Latex   . Lisinopril   . Oxycodone   . Sulfa Antibiotics   . Sulfonamide Derivatives   . Valium    Current Outpatient Prescriptions  Medication Sig Dispense Refill  . alendronate (FOSAMAX) 70 MG/75ML solution Take 70 mg by mouth every 7 (seven) days. Take with a full glass of water on an empty stomach.      Marland Kitchen amLODipine-olmesartan (AZOR) 10-40 MG per tablet Take 1 tablet by mouth daily.      Marland Kitchen  calcium carbonate (OS-CAL) 1250 MG chewable tablet Chew 1 tablet by mouth daily. Pt takes Calcrate      . Cholecalciferol (VITAMIN D-3 PO) Take by mouth every morning.      . diclofenac sodium (VOLTAREN) 1 % GEL Apply topically 4 (four) times daily.      . folic acid (FOLVITE) 1 MG tablet Take 1 tablet (1 mg total) by mouth 2 (two) times daily.  60 tablet  11  . Misc Natural Products (WHITE WILLOW BARK PO) Take by mouth daily. " Pt states that white willow bark is natural aspirin "      . NON FORMULARY Take by mouth 3 (three) times a week. Cleanse-more      . PAPAYA ENZYMES PO Take by mouth 3 (three) times daily after meals.      . vitamin C (ASCORBIC ACID) 500 MG tablet Take 500 mg by  mouth daily. Take 2 po       No current facility-administered medications for this visit.   OBJECTIVE: Filed Vitals:   09/03/14 1246  BP: 124/46  Pulse: 66  Temp: 98.4 F (36.9 C)  Resp: 14   Body mass index is 30.86 kg/(m^2). ECOG FS:1 - Symptomatic but completely ambulatory Ocular: Sclerae unicteric, pupils equal, round and reactive to light Ear-nose-throat: Oropharynx clear, dentition fair Lymphatic: No cervical or supraclavicular adenopathy Lungs no rales or rhonchi, good excursion bilaterally Heart regular rate and rhythm, no murmur appreciated Abd soft, nontender, positive bowel sounds MSK no focal spinal tenderness, no joint edema Neuro: non-focal, well-oriented, appropriate affect Breasts: Deferred  LAB RESULTS: CMP  No results found for this basename: na, k, cl, co2, glucose, bun, creatinine, calcium, prot, albumin, ast, alt, alkphos, bilitot, gfrnonaa, gfraa   No results found for this basename: SPEP, UPEP,  kappa and lambda light chains   Lab Results  Component Value Date   WBC 4.6 09/03/2014   NEUTROABS 2.5 09/03/2014   HGB 11.6 09/03/2014   HCT 35.8 09/03/2014   MCV 92 09/03/2014   PLT 153 09/03/2014   No results found for this basename: LABCA2   No components found with this basename: LABCA125   No results found for this basename: INR,  in the last 168 hours Urinalysis No results found for this basename: colorurine, appearanceur, labspec, phurine, glucoseu, hgbur, bilirubinur, ketonesur, proteinur, urobilinogen, nitrite, leukocytesur   STUDIES: No results found.  ASSESSMENT/PLAN: Brittany Weber is 80 year old American female with multifactorial anemia. Her hemoglobin is holding steady. She will not need any Aranesp today.  We will wait and see what her iron studies show.   We will see her back in 2 months for labs and follow-up.  All questions were answered and she is in agreement with the plan. She knows to call here with any questions or concerns and to go  to the ED in the event of an emergency. We can certainly see her sooner if need be.   Verdie Mosher, NP 09/03/2014 1:48 PM

## 2014-09-18 ENCOUNTER — Other Ambulatory Visit: Payer: Self-pay | Admitting: Nurse Practitioner

## 2014-09-18 DIAGNOSIS — D529 Folate deficiency anemia, unspecified: Secondary | ICD-10-CM

## 2014-09-18 MED ORDER — FOLIC ACID 1 MG PO TABS: 1.0000 mg | ORAL_TABLET | Freq: Two times a day (BID) | ORAL | Status: DC

## 2014-09-26 DIAGNOSIS — M169 Osteoarthritis of hip, unspecified: Secondary | ICD-10-CM | POA: Diagnosis not present

## 2014-10-02 ENCOUNTER — Encounter: Payer: Medicare Other | Admitting: Podiatry

## 2014-10-02 ENCOUNTER — Ambulatory Visit: Payer: Medicare Other

## 2014-10-02 NOTE — Progress Notes (Signed)
   Subjective:    Patient ID: Brittany Weber, female    DOB: 1932-08-18, 80 y.o.   MRN: [id number]  HPI  Pt presents for nail debridement   Review of Systems     Objective:   Physical Exam        Assessment & Plan:   This encounter was created in error - please disregard.

## 2014-10-30 ENCOUNTER — Ambulatory Visit: Payer: Medicare Other

## 2014-10-31 ENCOUNTER — Encounter: Payer: Self-pay | Admitting: Family

## 2014-10-31 ENCOUNTER — Ambulatory Visit (HOSPITAL_BASED_OUTPATIENT_CLINIC_OR_DEPARTMENT_OTHER): Payer: Medicare Other | Admitting: Family

## 2014-10-31 ENCOUNTER — Other Ambulatory Visit (HOSPITAL_BASED_OUTPATIENT_CLINIC_OR_DEPARTMENT_OTHER): Payer: Medicare Other | Admitting: Lab

## 2014-10-31 DIAGNOSIS — D509 Iron deficiency anemia, unspecified: Secondary | ICD-10-CM

## 2014-10-31 DIAGNOSIS — D649 Anemia, unspecified: Secondary | ICD-10-CM

## 2014-10-31 DIAGNOSIS — N289 Disorder of kidney and ureter, unspecified: Secondary | ICD-10-CM | POA: Diagnosis not present

## 2014-10-31 LAB — CBC WITH DIFFERENTIAL (CANCER CENTER ONLY)
BASO#: 0 10*3/uL (ref 0.0–0.2)
BASO%: 1 % (ref 0.0–2.0)
EOS%: 3.7 % (ref 0.0–7.0)
Eosinophils Absolute: 0.1 10*3/uL (ref 0.0–0.5)
HEMATOCRIT: 35.1 % (ref 34.8–46.6)
HEMOGLOBIN: 11.3 g/dL — AB (ref 11.6–15.9)
LYMPH#: 1.5 10*3/uL (ref 0.9–3.3)
LYMPH%: 39.4 % (ref 14.0–48.0)
MCH: 29.7 pg (ref 26.0–34.0)
MCHC: 32.2 g/dL (ref 32.0–36.0)
MCV: 92 fL (ref 81–101)
MONO#: 0.4 10*3/uL (ref 0.1–0.9)
MONO%: 10.5 % (ref 0.0–13.0)
NEUT%: 45.4 % (ref 39.6–80.0)
NEUTROS ABS: 1.7 10*3/uL (ref 1.5–6.5)
Platelets: 133 10*3/uL — ABNORMAL LOW (ref 145–400)
RBC: 3.81 10*6/uL (ref 3.70–5.32)
RDW: 14.3 % (ref 11.1–15.7)
WBC: 3.8 10*3/uL — AB (ref 3.9–10.0)

## 2014-10-31 LAB — RETICULOCYTES (CHCC)
ABS Retic: 42.6 K/uL (ref 19.0–186.0)
RBC.: 3.87 MIL/uL (ref 3.87–5.11)
Retic Ct Pct: 1.1 % (ref 0.4–2.3)

## 2014-10-31 NOTE — Progress Notes (Signed)
[facility]  Telephone:(336) [phone] Fax:(336) [phone]  ID: Brittany BastDoris J Weber OB: July 13, 1932 MR#: [id number] JYN#:[id number]#:[id number] Patient Care Team: Marva PandaKimberly Millsaps, NP as PCP - General  DIAGNOSIS: Anemia of renal insufficiency Intermittent iron deficiency anemia  INTERVAL HISTORY: [name] is back for a follow-up. She is doing ok but feels tired at times. She is trying not to nap as much during the day.   She is still having a lot of problems with her back and hips but still does not want to have any surgery. She uses a walker or cane to get around. In July, her iron saturation was 30% and her ferritin was 1,765.  She denies cough, rash, fever, chills, n/v, headache, dizziness, SOB, chest pain, palpitations, abdominal pain, constipation, diarrhea, blood in urine or stool.  She denies swelling, tenderness, numbness or tingling in her extremities.  Her appetite is good and she is drinking plenty of fluids.    CURRENT TREATMENT: Aranesp 300 mcg subcutaneous as needed for hemoglobin less than 11  IV iron as indicated  REVIEW OF SYSTEMS: All other 10 point review of systems is negative except for those issues mentioned above.   PAST MEDICAL HISTORY: Past Medical History  Diagnosis Date  . Anemia of renal disease 10/22/2011  . Anemia, iron deficiency 10/22/2011  . Cataract    PAST SURGICAL HISTORY: Past Surgical History  Procedure Laterality Date  . Abdominal hysterectomy    . Appendectomy     FAMILY HISTORY Family History  Problem Relation Age of Onset  . Diabetes Father   . Diabetes Mother   . Kidney disease Mother    GYNECOLOGIC HISTORY:  No LMP recorded. Patient has had a hysterectomy.   SOCIAL HISTORY:  History   Social History  . Marital Status: Divorced    Spouse Name: N/A    Number of Children: N/A  . Years of Education: N/A   Occupational History  . Not on file.   Social History Main Topics  . Smoking status: Never Smoker   . Smokeless  tobacco: Never Used     Comment: never used tobacco  . Alcohol Use: No  . Drug Use: No  . Sexual Activity: Not on file   Other Topics Concern  . Not on file   Social History Narrative   ADVANCED DIRECTIVES: <no information>  HEALTH MAINTENANCE: History  Substance Use Topics  . Smoking status: Never Smoker   . Smokeless tobacco: Never Used     Comment: never used tobacco  . Alcohol Use: No   Colonoscopy: PAP: Bone density: Lipid panel:  Allergies  Allergen Reactions  . Amoxicillin   . Ampicillin   . Aspirin   . Clarithromycin   . Clarithromycin   . Codeine   . Darvocet [Propoxyphene N-Acetaminophen]   . Demerol   . Diazepam   . Erythromycin   . Indomethacin   . Iron     Pt states she is "allergic to iron pills"- no anaphylaxis to Iron. Tolerates Feraheme IV  . Latex   . Lisinopril   . Oxycodone   . Sulfa Antibiotics   . Sulfonamide Derivatives   . Valium    Current Outpatient Prescriptions  Medication Sig Dispense Refill  . alendronate (FOSAMAX) 70 MG/75ML solution Take 70 mg by mouth every 7 (seven) days. Take with a full glass of water on an empty stomach.    Marland Kitchen. amLODipine-olmesartan (AZOR) 10-40 MG per tablet Take 1 tablet by mouth daily.    . calcium  carbonate (OS-CAL) 1250 MG chewable tablet Chew 1 tablet by mouth daily. Pt takes Calcrate    . Cholecalciferol (VITAMIN D-3 PO) Take by mouth every morning.    Marland Kitchen. dexamethasone (DECADRON) 4 MG tablet Take 4 mg by mouth as needed (for pain).    Marland Kitchen. diclofenac sodium (VOLTAREN) 1 % GEL Apply topically 4 (four) times daily.    . folic acid (FOLVITE) 1 MG tablet Take 1 tablet (1 mg total) by mouth 2 (two) times daily. 60 tablet 11  . Misc Natural Products (WHITE WILLOW BARK PO) Take by mouth daily. " Pt states that white willow bark is natural aspirin "    . NON FORMULARY Take by mouth 3 (three) times a week. Cleanse-more    . PAPAYA ENZYMES PO Take by mouth 3 (three) times daily after meals.    . vitamin C  (ASCORBIC ACID) 500 MG tablet Take 500 mg by mouth daily. Take 2 po     No current facility-administered medications for this visit.   OBJECTIVE: Filed Vitals:   10/31/14 1142  BP: 134/59  Pulse: 75  Temp: 98.6 F (37 C)  Resp: 16   Body mass index is 30.47 kg/(m^2). ECOG FS:1 - Symptomatic but completely ambulatory Ocular: Sclerae unicteric, pupils equal, round and reactive to light Ear-nose-throat: Oropharynx clear, dentition fair Lymphatic: No cervical or supraclavicular adenopathy Lungs no rales or rhonchi, good excursion bilaterally Heart regular rate and rhythm, no murmur appreciated Abd soft, nontender, positive bowel sounds MSK no focal spinal tenderness, no joint edema Neuro: non-focal, well-oriented, appropriate affect Breasts: Deferred  LAB RESULTS: CMP  No results found for: NA No results found for: SPEP Lab Results  Component Value Date   WBC 3.8* 10/31/2014   NEUTROABS 1.7 10/31/2014   HGB 11.3* 10/31/2014   HCT 35.1 10/31/2014   MCV 92 10/31/2014   PLT 133* 10/31/2014   No results found for: LABCA2 No components found for: ZOXWR604LABCA125 No results for input(s): INR in the last 168 hours. Urinalysis No results found for: COLORURINE STUDIES: No results found.  ASSESSMENT/PLAN: [name] is 80 year old American female with multifactorial anemia. Her Hgb is 11.3 so she will not need any Aranesp today.  We will wait and see what her iron studies show.   We will see her back in 4 months for labs and follow-up.  All questions were answered and she is in agreement with the plan. She knows to call here with any questions or concerns and to go to the ED in the event of an emergency. We can certainly see her sooner if need be.   Verdie MosherINCINNATI,Willow Reczek M, NP 10/31/2014 12:44 PM

## 2014-11-02 LAB — IRON AND TIBC CHCC
%SAT: 33 % (ref 21–57)
Iron: 81 ug/dL (ref 41–142)
TIBC: 248 ug/dL (ref 236–444)
UIBC: 167 ug/dL (ref 120–384)

## 2014-11-02 LAB — FERRITIN CHCC: Ferritin: 1549 ng/ml — ABNORMAL HIGH (ref 9–269)

## 2014-11-08 DIAGNOSIS — E559 Vitamin D deficiency, unspecified: Secondary | ICD-10-CM | POA: Diagnosis not present

## 2014-11-08 DIAGNOSIS — E780 Pure hypercholesterolemia: Secondary | ICD-10-CM | POA: Diagnosis not present

## 2014-11-08 DIAGNOSIS — I10 Essential (primary) hypertension: Secondary | ICD-10-CM | POA: Diagnosis not present

## 2014-11-12 DIAGNOSIS — I2510 Atherosclerotic heart disease of native coronary artery without angina pectoris: Secondary | ICD-10-CM | POA: Diagnosis not present

## 2014-11-12 DIAGNOSIS — E559 Vitamin D deficiency, unspecified: Secondary | ICD-10-CM | POA: Diagnosis not present

## 2014-11-12 DIAGNOSIS — E669 Obesity, unspecified: Secondary | ICD-10-CM | POA: Diagnosis not present

## 2014-11-12 DIAGNOSIS — M1990 Unspecified osteoarthritis, unspecified site: Secondary | ICD-10-CM | POA: Diagnosis not present

## 2014-11-12 DIAGNOSIS — D649 Anemia, unspecified: Secondary | ICD-10-CM | POA: Diagnosis not present

## 2014-11-12 DIAGNOSIS — I10 Essential (primary) hypertension: Secondary | ICD-10-CM | POA: Diagnosis not present

## 2014-11-12 DIAGNOSIS — I361 Nonrheumatic tricuspid (valve) insufficiency: Secondary | ICD-10-CM | POA: Diagnosis not present

## 2014-11-12 DIAGNOSIS — I340 Nonrheumatic mitral (valve) insufficiency: Secondary | ICD-10-CM | POA: Diagnosis not present

## 2014-11-13 ENCOUNTER — Ambulatory Visit (INDEPENDENT_AMBULATORY_CARE_PROVIDER_SITE_OTHER): Payer: Medicare Other

## 2014-11-13 DIAGNOSIS — M2010 Hallux valgus (acquired), unspecified foot: Secondary | ICD-10-CM

## 2014-11-13 DIAGNOSIS — M79673 Pain in unspecified foot: Secondary | ICD-10-CM

## 2014-11-13 DIAGNOSIS — G629 Polyneuropathy, unspecified: Secondary | ICD-10-CM | POA: Diagnosis not present

## 2014-11-13 DIAGNOSIS — B351 Tinea unguium: Secondary | ICD-10-CM | POA: Diagnosis not present

## 2014-11-13 NOTE — Progress Notes (Signed)
   Subjective:    Patient ID: Brittany Weber, female    DOB: 07-26-1932, 80 y.o.   MRN: [id number]  HPI Pt presents for nail debridement  Review of Systems no new findings or systemic changes noted     Objective:   Physical Exam neurovascular status is intact pedal pulses DP +2 PT 1 over 4 bilateral mild edema noted there is decreased sensation Semmes Weinstein to the forefoot digit nails friable dystrophic 1 through 5 bilateral also pinch callus left hallux is identified. No open wounds no ulcers no secondary infections there is notable HAV deformity and hammertoe deformities. History of lumbar radiculopathy possibly contributing to deformities and sensation abnormalities       Assessment & Plan:  Assessment history peripheral neuropathy painful mycotic nails dystrophic crumbly brittle nails 1 through 5 bilateral debrided at this time small pinch callus left hallux is also debrided maintain lotion to the skin as recommended recheck in 3 months for follow-up and continued mycotic nail care in the future as needed next  Alvan Dameichard Delona Clasby DPM

## 2014-11-26 DIAGNOSIS — M169 Osteoarthritis of hip, unspecified: Secondary | ICD-10-CM | POA: Diagnosis not present

## 2015-02-12 ENCOUNTER — Ambulatory Visit (INDEPENDENT_AMBULATORY_CARE_PROVIDER_SITE_OTHER): Payer: Medicare Other

## 2015-02-12 VITALS — BP 116/56 | HR 76

## 2015-02-12 DIAGNOSIS — Q828 Other specified congenital malformations of skin: Secondary | ICD-10-CM

## 2015-02-12 DIAGNOSIS — B351 Tinea unguium: Secondary | ICD-10-CM | POA: Diagnosis not present

## 2015-02-12 DIAGNOSIS — M79673 Pain in unspecified foot: Secondary | ICD-10-CM

## 2015-02-12 DIAGNOSIS — M79606 Pain in leg, unspecified: Secondary | ICD-10-CM

## 2015-02-12 DIAGNOSIS — M2010 Hallux valgus (acquired), unspecified foot: Secondary | ICD-10-CM

## 2015-02-12 DIAGNOSIS — G629 Polyneuropathy, unspecified: Secondary | ICD-10-CM

## 2015-02-12 NOTE — Progress Notes (Signed)
   Subjective:    Patient ID: Brittany Weber, female    DOB: [birth year], 81 y.o.   MRN: [id number]  HPI debride 10 toes, nails are causing some pain now.   Review of Systems no new findings or systemic changes.     Objective:   Physical Exam Patient presents this time for follow-up footcare patient is severe arthropathy with gait abnormality walks with the assistance of a walker. Does have thick brittle crumbly dystrophic friable nails 1 through 5 bilateral back she right hallux nails previously avulsed but does have some hypertrophic scar tissue. DP +2 PT 1 over 4 bilateral Refill timed 3-4 seconds all digits intact epicritic sensation noted there is some dystrophy discoloration darkening nails no open wounds no ulcers no secondary infections. Patient does have history of arthritis with osteopenic changes and complications.       Assessment & Plan:  Assessment this time is peripheral neuropathy with gait abnormality as well as Brittany Weber arthropathy and deformity of feet and toes patient presents at this time with dystrophic friable crumbly brittle nails 1 through 5 left 2 through 5 right painful mycotic nails are debrided at this time resume symptomology and nail dystrophy return in 3 months for follow-up and continued palliative nail care in the future  Alvan Dameichard Nyellie Yetter DPM

## 2015-02-25 DIAGNOSIS — I361 Nonrheumatic tricuspid (valve) insufficiency: Secondary | ICD-10-CM | POA: Diagnosis not present

## 2015-02-25 DIAGNOSIS — E785 Hyperlipidemia, unspecified: Secondary | ICD-10-CM | POA: Diagnosis not present

## 2015-02-25 DIAGNOSIS — I10 Essential (primary) hypertension: Secondary | ICD-10-CM | POA: Diagnosis not present

## 2015-02-25 DIAGNOSIS — E669 Obesity, unspecified: Secondary | ICD-10-CM | POA: Diagnosis not present

## 2015-02-25 DIAGNOSIS — I340 Nonrheumatic mitral (valve) insufficiency: Secondary | ICD-10-CM | POA: Diagnosis not present

## 2015-02-25 DIAGNOSIS — E559 Vitamin D deficiency, unspecified: Secondary | ICD-10-CM | POA: Diagnosis not present

## 2015-02-25 DIAGNOSIS — D649 Anemia, unspecified: Secondary | ICD-10-CM | POA: Diagnosis not present

## 2015-02-25 DIAGNOSIS — M1990 Unspecified osteoarthritis, unspecified site: Secondary | ICD-10-CM | POA: Diagnosis not present

## 2015-02-25 DIAGNOSIS — I2510 Atherosclerotic heart disease of native coronary artery without angina pectoris: Secondary | ICD-10-CM | POA: Diagnosis not present

## 2015-02-27 ENCOUNTER — Ambulatory Visit (HOSPITAL_BASED_OUTPATIENT_CLINIC_OR_DEPARTMENT_OTHER): Payer: Medicare Other | Admitting: Hematology & Oncology

## 2015-02-27 ENCOUNTER — Encounter: Payer: Self-pay | Admitting: Hematology & Oncology

## 2015-02-27 ENCOUNTER — Other Ambulatory Visit: Payer: Medicare Other | Admitting: Lab

## 2015-02-27 ENCOUNTER — Other Ambulatory Visit (HOSPITAL_BASED_OUTPATIENT_CLINIC_OR_DEPARTMENT_OTHER): Payer: Medicare Other | Admitting: Lab

## 2015-02-27 ENCOUNTER — Ambulatory Visit: Payer: Medicare Other | Admitting: Hematology & Oncology

## 2015-02-27 VITALS — BP 145/63 | HR 78 | Temp 98.7°F | Resp 16 | Ht 60.0 in | Wt 161.0 lb

## 2015-02-27 DIAGNOSIS — N189 Chronic kidney disease, unspecified: Secondary | ICD-10-CM

## 2015-02-27 DIAGNOSIS — D509 Iron deficiency anemia, unspecified: Secondary | ICD-10-CM

## 2015-02-27 DIAGNOSIS — D631 Anemia in chronic kidney disease: Secondary | ICD-10-CM

## 2015-02-27 LAB — CBC WITH DIFFERENTIAL (CANCER CENTER ONLY)
BASO#: 0 10*3/uL (ref 0.0–0.2)
BASO%: 0.6 % (ref 0.0–2.0)
EOS%: 2.8 % (ref 0.0–7.0)
Eosinophils Absolute: 0.1 10*3/uL (ref 0.0–0.5)
HEMATOCRIT: 35.3 % (ref 34.8–46.6)
HGB: 11.4 g/dL — ABNORMAL LOW (ref 11.6–15.9)
LYMPH#: 1.9 10*3/uL (ref 0.9–3.3)
LYMPH%: 39.5 % (ref 14.0–48.0)
MCH: 29.7 pg (ref 26.0–34.0)
MCHC: 32.3 g/dL (ref 32.0–36.0)
MCV: 92 fL (ref 81–101)
MONO#: 0.6 10*3/uL (ref 0.1–0.9)
MONO%: 13.7 % — ABNORMAL HIGH (ref 0.0–13.0)
NEUT%: 43.4 % (ref 39.6–80.0)
NEUTROS ABS: 2 10*3/uL (ref 1.5–6.5)
Platelets: 158 10*3/uL (ref 145–400)
RBC: 3.84 10*6/uL (ref 3.70–5.32)
RDW: 13.6 % (ref 11.1–15.7)
WBC: 4.7 10*3/uL (ref 3.9–10.0)

## 2015-02-27 LAB — RETICULOCYTES (CHCC)
ABS Retic: 43.6 10*3/uL (ref 19.0–186.0)
RBC.: 3.96 MIL/uL (ref 3.87–5.11)
RETIC CT PCT: 1.1 % (ref 0.4–2.3)

## 2015-02-27 NOTE — Progress Notes (Signed)
Hematology and Oncology Follow Up Visit  Brittany Weber [id number] 10-27-1932 81 y.o. 02/27/2015   Principle Diagnosis:   Anemia of renal insufficiency  Intermittent iron deficiency anemia  Current Therapy:    Aranesp 300 mcg subcutaneous as needed for hemoglobin less than 11  IV iron as indicated     Interim History:  Ms.  Weber is back for followup. She's doing okay. She still had a lot of problems with her back and hips however, she still does not want to have any surgery. She feels that this actually is getting a little bit better.  She last got Aranesp and iron probably a year and a half ago. Last Aranesp was given back in generate 2015 last Feraheme was given in August 2014.    We last saw her, her ferritin was 1549. Her iron saturation was 33%. A lot of the ferritin elevation is from her bad arthritis . She's had no bleeding. She had no fever. She's had no cough or shortness of breath. There's been no leg swelling. There's been no rashes.       Medications:  Current outpatient prescriptions:  .  alendronate (FOSAMAX) 70 MG/75ML solution, Take 70 mg by mouth every 7 (seven) days. Take with a full glass of water on an empty stomach., Disp: , Rfl:  .  amLODipine-olmesartan (AZOR) 10-40 MG per tablet, Take 1 tablet by mouth daily., Disp: , Rfl:  .  calcium carbonate (OS-CAL) 1250 MG chewable tablet, Chew 1 tablet by mouth daily. Pt takes Calcrate, Disp: , Rfl:  .  Cholecalciferol (VITAMIN D-3 PO), Take by mouth every morning., Disp: , Rfl:  .  cilostazol (PLETAL) 100 MG tablet, Take 100 mg by mouth 2 (two) times daily. , Disp: , Rfl:  .  dexamethasone (DECADRON) 4 MG tablet, Take 4 mg by mouth as needed (for pain)., Disp: , Rfl:  .  diclofenac sodium (VOLTAREN) 1 % GEL, Apply topically 4 (four) times daily., Disp: , Rfl:  .  folic acid (FOLVITE) 1 MG tablet, Take 1 tablet (1 mg total) by mouth 2 (two) times daily., Disp: 60 tablet, Rfl: 11 .  Misc Natural Products  (WHITE WILLOW BARK PO), Take by mouth daily. " Pt states that white willow bark is natural aspirin ", Disp: , Rfl:  .  NON FORMULARY, Take by mouth 3 (three) times a week. Cleanse-more, Disp: , Rfl:  .  PAPAYA ENZYMES PO, Take by mouth 3 (three) times daily after meals., Disp: , Rfl:  .  vitamin C (ASCORBIC ACID) 500 MG tablet, Take 500 mg by mouth daily. Take 2 po, Disp: , Rfl:   Allergies:  Allergies  Allergen Reactions  . Amoxicillin   . Ampicillin   . Aspirin   . Clarithromycin   . Clarithromycin   . Codeine   . Darvocet [Propoxyphene N-Acetaminophen]   . Demerol   . Diazepam   . Erythromycin   . Indomethacin   . Iron     Pt states she is "allergic to iron pills"- no anaphylaxis to Iron. Tolerates Feraheme IV  . Latex   . Lisinopril   . Oxycodone   . Sulfa Antibiotics   . Sulfonamide Derivatives   . Valium     Past Medical History, Surgical history, Social history, and Family History were reviewed and updated.  Review of Systems: As above  Physical Exam:  height is 5' (1.524 m) and weight is 161 lb (73.029 kg). Her oral temperature is 98.7 F (37.1 C).  Her blood pressure is 145/63 and her pulse is 78. Her respiration is 16.   Somewhat elderly African American female. Her head and neck exam shows no adenopathy. No lymph nodes are noted in the neck. Lungs are clear. Cardiac exam regular rate and rhythm. No murmurs are noted. Abdomen is soft. There is no palpable liver or spleen. Back exam no tenderness over the spine. Extremities shows a brace on the right leg. She has mild edema in her lower legs. Skin exam no rashes.  Lab Results  Component Value Date   WBC 4.7 02/27/2015   HGB 11.4* 02/27/2015   HCT 35.3 02/27/2015   MCV 92 02/27/2015   PLT 158 02/27/2015     Chemistry   No results found for: NA No results found for: CALCIUM       Impression and Plan: Brittany Weber is an 81 year old American female. She has multifactorial anemia. Her hemoglobin is holding  relatively steady. She will not need any Aranesp. I am glad that the blood count has stabilized.  We will see her iron studies show. Her ferritin is mostly an acute phase reactant from her arthritic issues.  We last saw her, her reticulocyte count was 1.1. Her marrow may need some  " activation". I will plan to get her back down in 3 more months.   Josph MachoENNEVER,PETER R, MD 3/23/20166:07 PM

## 2015-02-28 LAB — FERRITIN CHCC: Ferritin: 1241 ng/ml — ABNORMAL HIGH (ref 9–269)

## 2015-02-28 LAB — IRON AND TIBC CHCC
%SAT: 22 % (ref 21–57)
IRON: 55 ug/dL (ref 41–142)
TIBC: 258 ug/dL (ref 236–444)
UIBC: 202 ug/dL (ref 120–384)

## 2015-04-03 ENCOUNTER — Other Ambulatory Visit: Payer: Self-pay | Admitting: *Deleted

## 2015-04-03 DIAGNOSIS — N644 Mastodynia: Secondary | ICD-10-CM

## 2015-05-10 DIAGNOSIS — I361 Nonrheumatic tricuspid (valve) insufficiency: Secondary | ICD-10-CM | POA: Diagnosis not present

## 2015-05-10 DIAGNOSIS — M1990 Unspecified osteoarthritis, unspecified site: Secondary | ICD-10-CM | POA: Diagnosis not present

## 2015-05-10 DIAGNOSIS — E785 Hyperlipidemia, unspecified: Secondary | ICD-10-CM | POA: Diagnosis not present

## 2015-05-10 DIAGNOSIS — I10 Essential (primary) hypertension: Secondary | ICD-10-CM | POA: Diagnosis not present

## 2015-05-10 DIAGNOSIS — D649 Anemia, unspecified: Secondary | ICD-10-CM | POA: Diagnosis not present

## 2015-05-10 DIAGNOSIS — E559 Vitamin D deficiency, unspecified: Secondary | ICD-10-CM | POA: Diagnosis not present

## 2015-05-10 DIAGNOSIS — I2510 Atherosclerotic heart disease of native coronary artery without angina pectoris: Secondary | ICD-10-CM | POA: Diagnosis not present

## 2015-05-10 DIAGNOSIS — I340 Nonrheumatic mitral (valve) insufficiency: Secondary | ICD-10-CM | POA: Diagnosis not present

## 2015-05-10 DIAGNOSIS — E669 Obesity, unspecified: Secondary | ICD-10-CM | POA: Diagnosis not present

## 2015-05-21 ENCOUNTER — Ambulatory Visit: Payer: Medicare Other | Admitting: Podiatry

## 2015-05-29 ENCOUNTER — Other Ambulatory Visit (HOSPITAL_BASED_OUTPATIENT_CLINIC_OR_DEPARTMENT_OTHER): Payer: Medicare Other

## 2015-05-29 ENCOUNTER — Encounter: Payer: Self-pay | Admitting: Family

## 2015-05-29 ENCOUNTER — Ambulatory Visit (HOSPITAL_BASED_OUTPATIENT_CLINIC_OR_DEPARTMENT_OTHER): Payer: Medicare Other | Admitting: Family

## 2015-05-29 VITALS — BP 141/65 | HR 83 | Temp 98.4°F | Resp 16 | Ht 60.0 in | Wt 166.0 lb

## 2015-05-29 DIAGNOSIS — D509 Iron deficiency anemia, unspecified: Secondary | ICD-10-CM

## 2015-05-29 DIAGNOSIS — D631 Anemia in chronic kidney disease: Secondary | ICD-10-CM

## 2015-05-29 DIAGNOSIS — N189 Chronic kidney disease, unspecified: Secondary | ICD-10-CM

## 2015-05-29 DIAGNOSIS — M25551 Pain in right hip: Secondary | ICD-10-CM

## 2015-05-29 LAB — CBC WITH DIFFERENTIAL (CANCER CENTER ONLY)
BASO#: 0 10*3/uL (ref 0.0–0.2)
BASO%: 0.6 % (ref 0.0–2.0)
EOS%: 4.5 % (ref 0.0–7.0)
Eosinophils Absolute: 0.2 10*3/uL (ref 0.0–0.5)
HEMATOCRIT: 33.4 % — AB (ref 34.8–46.6)
HGB: 10.9 g/dL — ABNORMAL LOW (ref 11.6–15.9)
LYMPH#: 1.6 10*3/uL (ref 0.9–3.3)
LYMPH%: 33.1 % (ref 14.0–48.0)
MCH: 29.7 pg (ref 26.0–34.0)
MCHC: 32.6 g/dL (ref 32.0–36.0)
MCV: 91 fL (ref 81–101)
MONO#: 0.7 10*3/uL (ref 0.1–0.9)
MONO%: 14 % — ABNORMAL HIGH (ref 0.0–13.0)
NEUT#: 2.3 10*3/uL (ref 1.5–6.5)
NEUT%: 47.8 % (ref 39.6–80.0)
PLATELETS: 171 10*3/uL (ref 145–400)
RBC: 3.67 10*6/uL — ABNORMAL LOW (ref 3.70–5.32)
RDW: 14.2 % (ref 11.1–15.7)
WBC: 4.7 10*3/uL (ref 3.9–10.0)

## 2015-05-29 LAB — COMPREHENSIVE METABOLIC PANEL
ALBUMIN: 4.5 g/dL (ref 3.5–5.2)
ALT: 13 U/L (ref 0–35)
AST: 17 U/L (ref 0–37)
Alkaline Phosphatase: 44 U/L (ref 39–117)
BILIRUBIN TOTAL: 0.2 mg/dL (ref 0.2–1.2)
BUN: 20 mg/dL (ref 6–23)
CO2: 28 mEq/L (ref 19–32)
Calcium: 9.9 mg/dL (ref 8.4–10.5)
Chloride: 103 mEq/L (ref 96–112)
Creatinine, Ser: 1.26 mg/dL — ABNORMAL HIGH (ref 0.50–1.10)
GLUCOSE: 108 mg/dL — AB (ref 70–99)
Potassium: 3.9 mEq/L (ref 3.5–5.3)
Sodium: 142 mEq/L (ref 135–145)
Total Protein: 7.3 g/dL (ref 6.0–8.3)

## 2015-05-29 NOTE — Progress Notes (Signed)
Hematology and Oncology Follow Up Visit  Brittany Weber [id number] 21-Dec-1931 81 y.o. 05/29/2015   Principle Diagnosis:  Anemia of renal insufficiency Intermittent iron deficiency anemia  Current Therapy:   Aranesp 300 mcg subcutaneous as needed for hemoglobin less than 11 IV iron as indicated    Interim History:  Brittany Weber is here today for a follow-up. She is doing well but having some fatigue at times. Her iron studies in March showed a ferritin of 1,241 and iron saturation of 22%. She last received Feraheme in August 2014.  No bruising or episodes of bleeding.  She has had some right hip pain. She is still considering a hip replacement and is not sure she want to proceed. She feels that her right hip has improved some and wants to give it a little more time.  No fever, chills, n/v, cough, rash, dizziness, SOB, chest pain, palpitations, diarrhea, blood in urine or stool. She has had some constipation which caused her some abdominal discomfort. This was relieved with stool softeners.  No swelling, numbness or tingling in her extremities. No new aches or pains.  Her appetite is ok. She is staying hydrated. Her weight is stable.   Medications:    Medication List       This list is accurate as of: 05/29/15  3:54 PM.  Always use your most recent med list.               alendronate 70 MG/75ML solution  Commonly known as:  FOSAMAX  Take 70 mg by mouth every 7 (seven) days. Take with a full glass of water on an empty stomach.     AZOR 10-40 MG per tablet  Generic drug:  amLODipine-olmesartan  Take 1 tablet by mouth daily.     calcium carbonate 1250 (500 CA) MG chewable tablet  Commonly known as:  OS-CAL  Chew 1 tablet by mouth daily. Pt takes Calcrate     cilostazol 100 MG tablet  Commonly known as:  PLETAL  Take 100 mg by mouth 2 (two) times daily.     dexamethasone 4 MG tablet  Commonly known as:  DECADRON  Take 4 mg by mouth as needed (for pain).     folic acid  1 MG tablet  Commonly known as:  FOLVITE  Take 1 tablet (1 mg total) by mouth 2 (two) times daily.     NON FORMULARY  Take by mouth 3 (three) times a week. Cleanse-more     PAPAYA ENZYMES PO  Take by mouth 3 (three) times daily after meals.     vitamin C 500 MG tablet  Commonly known as:  ASCORBIC ACID  Take 500 mg by mouth daily. Take 2 po     VITAMIN D-3 PO  Take by mouth every morning.     VOLTAREN 1 % Gel  Generic drug:  diclofenac sodium  Apply topically 4 (four) times daily.     WHITE WILLOW BARK PO  Take by mouth daily. " Pt states that white willow bark is natural aspirin "        Allergies:  Allergies  Allergen Reactions  . Amoxicillin   . Ampicillin   . Aspirin   . Clarithromycin   . Clarithromycin   . Codeine   . Darvocet [Propoxyphene N-Acetaminophen]   . Demerol   . Diazepam   . Erythromycin   . Indomethacin   . Iron     Pt states she is "allergic to iron pills"- no anaphylaxis  to Iron. Tolerates Feraheme IV  . Latex   . Lisinopril   . Oxycodone   . Sulfa Antibiotics   . Sulfonamide Derivatives   . Valium     Past Medical History, Surgical history, Social history, and Family History were reviewed and updated.  Review of Systems: All other 10 point review of systems is negative.   Physical Exam:  vitals were not taken for this visit.  Wt Readings from Last 3 Encounters:  02/27/15 161 lb (73.029 kg)  10/31/14 156 lb (70.761 kg)  09/03/14 158 lb (71.668 kg)    Ocular: Sclerae unicteric, pupils equal, round and reactive to light Ear-nose-throat: Oropharynx clear, dentition fair Lymphatic: No cervical or supraclavicular adenopathy Lungs no rales or rhonchi, good excursion bilaterally Heart regular rate and rhythm, no murmur appreciated Abd soft, nontender, positive bowel sounds MSK no focal spinal tenderness, no joint edema Neuro: non-focal, well-oriented, appropriate affect Breasts: Deferred  Lab Results  Component Value Date    WBC 4.7 05/29/2015   HGB 10.9* 05/29/2015   HCT 33.4* 05/29/2015   MCV 91 05/29/2015   PLT 171 05/29/2015   Lab Results  Component Value Date   FERRITIN 1,241* 02/27/2015   IRON 55 02/27/2015   TIBC 258 02/27/2015   UIBC 202 02/27/2015   IRONPCTSAT 22 02/27/2015   Lab Results  Component Value Date   RETICCTPCT 1.1 02/27/2015   RBC 3.67* 05/29/2015   RETICCTABS 43.6 02/27/2015   No results found for: KPAFRELGTCHN, LAMBDASER, KAPLAMBRATIO No results found for: IGGSERUM, IGA, IGMSERUM No results found for: TOTALPROTELP, ALBUMINELP, A1GS, A2GS, BETS, BETA2SER, GAMS, MSPIKE, SPEI   Chemistry   No results found for: NA, K, CL, CO2, BUN, CREATININE, GLU No results found for: CALCIUM, ALKPHOS, AST, ALT, BILITOT   Impression and Plan: Brittany Weber is an 81 year old African American female  multifactorial anemia.  Her CBC today is good. We will see what her iron studies show. We can bring her in later this week for Feraheme if needed.  She feels that her hip pain is slightly improved and  have a hip replacement.  We will hold off on giving her Aranesp at this time.  We will plan to see her back in 6 weeks for labs and follow-up.  She will contact us with any questions or concerns. We can certainly see her sooner if need be.   Brittany Mosher, NP 6/22/20163:54 PM

## 2015-05-30 LAB — IRON AND TIBC CHCC
%SAT: 35 % (ref 21–57)
IRON: 86 ug/dL (ref 41–142)
TIBC: 244 ug/dL (ref 236–444)
UIBC: 158 ug/dL (ref 120–384)

## 2015-05-30 LAB — FERRITIN CHCC

## 2015-06-04 ENCOUNTER — Telehealth: Payer: Self-pay | Admitting: *Deleted

## 2015-06-04 NOTE — Telephone Encounter (Signed)
Pt states she is calling concerning an appt set up this am.

## 2015-06-07 DIAGNOSIS — Z8262 Family history of osteoporosis: Secondary | ICD-10-CM | POA: Diagnosis not present

## 2015-06-07 DIAGNOSIS — Z1231 Encounter for screening mammogram for malignant neoplasm of breast: Secondary | ICD-10-CM | POA: Diagnosis not present

## 2015-06-12 DIAGNOSIS — M169 Osteoarthritis of hip, unspecified: Secondary | ICD-10-CM | POA: Diagnosis not present

## 2015-06-18 ENCOUNTER — Ambulatory Visit: Payer: Medicare Other | Admitting: Podiatry

## 2015-06-28 DIAGNOSIS — Z13228 Encounter for screening for other metabolic disorders: Secondary | ICD-10-CM | POA: Diagnosis not present

## 2015-06-28 DIAGNOSIS — Z0000 Encounter for general adult medical examination without abnormal findings: Secondary | ICD-10-CM | POA: Diagnosis not present

## 2015-06-28 DIAGNOSIS — I739 Peripheral vascular disease, unspecified: Secondary | ICD-10-CM | POA: Insufficient documentation

## 2015-07-01 DIAGNOSIS — H2512 Age-related nuclear cataract, left eye: Secondary | ICD-10-CM | POA: Diagnosis not present

## 2015-07-01 DIAGNOSIS — H25012 Cortical age-related cataract, left eye: Secondary | ICD-10-CM | POA: Diagnosis not present

## 2015-07-01 DIAGNOSIS — H40023 Open angle with borderline findings, high risk, bilateral: Secondary | ICD-10-CM | POA: Diagnosis not present

## 2015-07-02 ENCOUNTER — Ambulatory Visit: Payer: Medicare Other | Admitting: Podiatry

## 2015-07-08 ENCOUNTER — Encounter: Payer: Self-pay | Admitting: Hematology & Oncology

## 2015-07-11 ENCOUNTER — Ambulatory Visit (HOSPITAL_BASED_OUTPATIENT_CLINIC_OR_DEPARTMENT_OTHER): Payer: Medicare Other | Admitting: Family

## 2015-07-11 ENCOUNTER — Encounter: Payer: Self-pay | Admitting: Family

## 2015-07-11 ENCOUNTER — Other Ambulatory Visit (HOSPITAL_BASED_OUTPATIENT_CLINIC_OR_DEPARTMENT_OTHER): Payer: Medicare Other

## 2015-07-11 VITALS — BP 145/55 | HR 80 | Temp 97.8°F | Resp 16 | Ht 60.0 in | Wt 164.0 lb

## 2015-07-11 DIAGNOSIS — D509 Iron deficiency anemia, unspecified: Secondary | ICD-10-CM

## 2015-07-11 DIAGNOSIS — D649 Anemia, unspecified: Secondary | ICD-10-CM | POA: Diagnosis not present

## 2015-07-11 DIAGNOSIS — N289 Disorder of kidney and ureter, unspecified: Secondary | ICD-10-CM

## 2015-07-11 DIAGNOSIS — D631 Anemia in chronic kidney disease: Secondary | ICD-10-CM

## 2015-07-11 DIAGNOSIS — N189 Chronic kidney disease, unspecified: Principal | ICD-10-CM

## 2015-07-11 LAB — COMPREHENSIVE METABOLIC PANEL
ALT: 13 U/L (ref 6–29)
AST: 16 U/L (ref 10–35)
Albumin: 4.2 g/dL (ref 3.6–5.1)
Alkaline Phosphatase: 44 U/L (ref 33–130)
BUN: 19 mg/dL (ref 7–25)
CO2: 27 mmol/L (ref 20–31)
Calcium: 9.5 mg/dL (ref 8.6–10.4)
Chloride: 105 mmol/L (ref 98–110)
Creatinine, Ser: 1.29 mg/dL — ABNORMAL HIGH (ref 0.60–0.88)
Glucose, Bld: 118 mg/dL — ABNORMAL HIGH (ref 65–99)
POTASSIUM: 4.1 mmol/L (ref 3.5–5.3)
Sodium: 142 mmol/L (ref 135–146)
Total Bilirubin: 0.3 mg/dL (ref 0.2–1.2)
Total Protein: 7.2 g/dL (ref 6.1–8.1)

## 2015-07-11 LAB — CBC WITH DIFFERENTIAL (CANCER CENTER ONLY)
BASO#: 0 10*3/uL (ref 0.0–0.2)
BASO%: 0.7 % (ref 0.0–2.0)
EOS%: 2.7 % (ref 0.0–7.0)
Eosinophils Absolute: 0.1 10*3/uL (ref 0.0–0.5)
HEMATOCRIT: 33.2 % — AB (ref 34.8–46.6)
HEMOGLOBIN: 10.7 g/dL — AB (ref 11.6–15.9)
LYMPH#: 1.4 10*3/uL (ref 0.9–3.3)
LYMPH%: 32.9 % (ref 14.0–48.0)
MCH: 29.2 pg (ref 26.0–34.0)
MCHC: 32.2 g/dL (ref 32.0–36.0)
MCV: 91 fL (ref 81–101)
MONO#: 0.5 10*3/uL (ref 0.1–0.9)
MONO%: 11.4 % (ref 0.0–13.0)
NEUT%: 52.3 % (ref 39.6–80.0)
NEUTROS ABS: 2.3 10*3/uL (ref 1.5–6.5)
Platelets: 147 10*3/uL (ref 145–400)
RBC: 3.67 10*6/uL — ABNORMAL LOW (ref 3.70–5.32)
RDW: 14.5 % (ref 11.1–15.7)
WBC: 4.4 10*3/uL (ref 3.9–10.0)

## 2015-07-11 LAB — RETICULOCYTES (CHCC)
ABS Retic: 40.5 10*3/uL (ref 19.0–186.0)
RBC.: 3.68 MIL/uL — AB (ref 3.87–5.11)
RETIC CT PCT: 1.1 % (ref 0.4–2.3)

## 2015-07-11 NOTE — Progress Notes (Signed)
Hematology and Oncology Follow Up Visit  ALVIS PULCINI [id number] December 11, 1931 81 y.o. 07/11/2015   Principle Diagnosis:  Anemia of renal insufficiency Intermittent iron deficiency anemia  Current Therapy:   Aranesp 300 mcg subcutaneous as needed for hemoglobin less than 11 IV iron as indicated    Interim History:  Ms. Reller is here today for a follow-up. She is doing well. She has had some mild fatigue at times and takes a nap if she needs to.  Her last dose of Feraheme was over a year ago. Her iron saturation in June was 22% and ferritin was over 1,000.  No fever, chills, n/v, cough, rash, dizziness, SOB, chest pain, palpitations, abdominal pain, changes in her bowel or bladder habits. She has had no blood in urine or stool.  No swelling, numbness or tingling in her extremities. She has arthritis in her right hip that bothers her some. She needs to have a replacement but does not want one. This is no worse. No new aches or pains.  She is using her walker to ambulate and  Her appetite comes and goes. Her "taste buds are dull." She is staying hydrated. Her weight is stable.   Medications:    Medication List       This list is accurate as of: 07/11/15  1:33 PM.  Always use your most recent med list.               AZOR 10-40 MG per tablet  Generic drug:  amLODipine-olmesartan  Take 1 tablet by mouth daily.     calcium carbonate 1250 (500 CA) MG chewable tablet  Commonly known as:  OS-CAL  Chew 1 tablet by mouth daily. Pt takes Calcrate     cilostazol 100 MG tablet  Commonly known as:  PLETAL  Take 100 mg by mouth 2 (two) times daily.     dexamethasone 4 MG tablet  Commonly known as:  DECADRON  Take 4 mg by mouth as needed (for pain).     folic acid 1 MG tablet  Commonly known as:  FOLVITE  Take 1 tablet (1 mg total) by mouth 2 (two) times daily.     NON FORMULARY  Take by mouth 3 (three) times a week. Cleanse-more     PAPAYA ENZYMES PO  Take by mouth 3 (three)  times daily after meals.     vitamin C 500 MG tablet  Commonly known as:  ASCORBIC ACID  Take 500 mg by mouth daily. Take 2 po     VITAMIN D-3 PO  Take by mouth every morning.     VOLTAREN 1 % Gel  Generic drug:  diclofenac sodium  Apply topically 4 (four) times daily.     WHITE WILLOW BARK PO  Take by mouth daily. " Pt states that white willow bark is natural aspirin "        Allergies:  Allergies  Allergen Reactions  . Amoxicillin   . Ampicillin   . Aspirin   . Clarithromycin   . Clarithromycin   . Codeine   . Darvocet [Propoxyphene N-Acetaminophen]   . Demerol   . Diazepam   . Erythromycin   . Indomethacin   . Iron     Pt states she is "allergic to iron pills"- no anaphylaxis to Iron. Tolerates Feraheme IV  . Latex   . Lisinopril   . Oxycodone   . Sulfa Antibiotics   . Sulfonamide Derivatives   . Valium     Past Medical  History, Surgical history, Social history, and Family History were reviewed and updated.  Review of Systems: All other 10 point review of systems is negative.   Physical Exam:  height is 5' (1.524 m) and weight is 164 lb (74.39 kg). Her oral temperature is 97.8 F (36.6 C). Her blood pressure is 145/55 and her pulse is 80. Her respiration is 16.   Wt Readings from Last 3 Encounters:  07/11/15 164 lb (74.39 kg)  05/29/15 166 lb (75.297 kg)  02/27/15 161 lb (73.029 kg)    Ocular: Sclerae unicteric, pupils equal, round and reactive to light Ear-nose-throat: Oropharynx clear, dentition fair Lymphatic: No cervical or supraclavicular adenopathy Lungs no rales or rhonchi, good excursion bilaterally Heart regular rate and rhythm, no murmur appreciated Abd soft, nontender, positive bowel sounds MSK no focal spinal tenderness, no joint edema Neuro: non-focal, well-oriented, appropriate affect Breasts: Deferred  Lab Results  Component Value Date   WBC 4.4 07/11/2015   HGB 10.7* 07/11/2015   HCT 33.2* 07/11/2015   MCV 91 07/11/2015    PLT 147 07/11/2015   Lab Results  Component Value Date   FERRITIN 1,179* 05/29/2015   IRON 86 05/29/2015   TIBC 244 05/29/2015   UIBC 158 05/29/2015   IRONPCTSAT 35 05/29/2015   Lab Results  Component Value Date   RETICCTPCT 1.1 02/27/2015   RBC 3.67* 07/11/2015   RETICCTABS 43.6 02/27/2015   No results found for: KPAFRELGTCHN, LAMBDASER, KAPLAMBRATIO No results found for: IGGSERUM, IGA, IGMSERUM No results found for: Dorene Ar, A1GS, A2GS, Karn Pickler, SPEI   Chemistry      Component Value Date/Time   NA 142 05/29/2015 1521   K 3.9 05/29/2015 1521   CL 103 05/29/2015 1521   CO2 28 05/29/2015 1521   BUN 20 05/29/2015 1521   CREATININE 1.26* 05/29/2015 1521      Component Value Date/Time   CALCIUM 9.9 05/29/2015 1521   ALKPHOS 44 05/29/2015 1521   AST 17 05/29/2015 1521   ALT 13 05/29/2015 1521   BILITOT 0.2 05/29/2015 1521     Impression and Plan: Ms. Mansouri is an 81 year old African American female with multifactorial anemia.  She has some mild fatigue at times.  Her last dose of Feraheme was one year ago. Her Hgb today is 10.7. We will hold off on giving her Aranesp today.  We will see what her irons studies show and bring her in next week for an infusion if needed.  She really needs to have a right hip replacement but she is still refusing.  We will plan to see her back in 6 weeks for labs and follow-up.  She will contact us with any questions or concerns. We can certainly see her sooner if need be.   Verdie Mosher, NP 8/4/20161:33 PM

## 2015-07-12 LAB — FERRITIN CHCC

## 2015-07-12 LAB — IRON AND TIBC CHCC
%SAT: 21 % (ref 21–57)
Iron: 49 ug/dL (ref 41–142)
TIBC: 236 ug/dL (ref 236–444)
UIBC: 187 ug/dL (ref 120–384)

## 2015-07-16 ENCOUNTER — Ambulatory Visit: Payer: Medicare Other | Admitting: Podiatry

## 2015-07-18 ENCOUNTER — Ambulatory Visit (INDEPENDENT_AMBULATORY_CARE_PROVIDER_SITE_OTHER): Payer: Medicare Other | Admitting: Podiatry

## 2015-07-18 DIAGNOSIS — M79673 Pain in unspecified foot: Secondary | ICD-10-CM

## 2015-07-18 DIAGNOSIS — B351 Tinea unguium: Secondary | ICD-10-CM | POA: Diagnosis not present

## 2015-07-18 NOTE — Progress Notes (Signed)
Patient ID: Brittany Weber, female   DOB: 1932/02/13, 81 y.o.   MRN: [id number] Complaint:  Visit Type: Patient returns to my office for continued preventative foot care services. Complaint: Patient states" my nails have grown long and thick and become painful to walk and wear shoes" . The patient presents for preventative foot care services. No changes to ROS  Podiatric Exam: Vascular: dorsalis pedis and posterior tibial pulses are palpable bilateral. Capillary return is immediate. Temperature gradient is WNL. Skin turgor WNL  Sensorium: Normal Semmes Weinstein monofilament test. Normal tactile sensation bilaterally. Nail Exam: Pt has thick disfigured discolored nails with subungual debris noted bilateral entire nail hallux through fifth toenails Ulcer Exam: There is no evidence of ulcer or pre-ulcerative changes or infection. Orthopedic Exam: Muscle tone and strength are WNL. No limitations in general ROM. No crepitus or effusions noted. Foot type and digits show no abnormalities. Bony prominences are unremarkable.Midfoot arthritis B/L.Skin: No Porokeratosis. No infection or ulcers  Diagnosis:  Onychomycosis, , Pain in right toe, pain in left toes  Treatment & Plan Procedures and Treatment: Consent by patient was obtained for treatment procedures. The patient understood the discussion of treatment and procedures well. All questions were answered thoroughly reviewed. Debridement of mycotic and hypertrophic toenails, 1 through 5 bilateral and clearing of subungual debris. No ulceration, no infection noted.  Return Visit-Office Procedure: Patient instructed to return to the office for a follow up visit 3 months for continued evaluation and treatment.

## 2015-07-23 DIAGNOSIS — H2512 Age-related nuclear cataract, left eye: Secondary | ICD-10-CM | POA: Diagnosis not present

## 2015-07-29 ENCOUNTER — Telehealth: Payer: Self-pay | Admitting: Hematology & Oncology

## 2015-07-29 NOTE — Telephone Encounter (Signed)
PT CALLED NEEDING AN EARLIER TIME DUE TO TRANSPORTATION ISSUES. APPT TIME DURATION AND TIME CHANGED TO PER PT.

## 2015-07-31 DIAGNOSIS — M169 Osteoarthritis of hip, unspecified: Secondary | ICD-10-CM | POA: Diagnosis not present

## 2015-08-22 DIAGNOSIS — H25011 Cortical age-related cataract, right eye: Secondary | ICD-10-CM | POA: Diagnosis not present

## 2015-08-22 DIAGNOSIS — H2511 Age-related nuclear cataract, right eye: Secondary | ICD-10-CM | POA: Diagnosis not present

## 2015-09-02 ENCOUNTER — Telehealth: Payer: Self-pay | Admitting: Hematology & Oncology

## 2015-09-02 NOTE — Telephone Encounter (Signed)
Patient called and cx 09/18/15 apt and resch for 09/25/15

## 2015-09-04 DIAGNOSIS — M169 Osteoarthritis of hip, unspecified: Secondary | ICD-10-CM | POA: Diagnosis not present

## 2015-09-09 DIAGNOSIS — E559 Vitamin D deficiency, unspecified: Secondary | ICD-10-CM | POA: Diagnosis not present

## 2015-09-09 DIAGNOSIS — I361 Nonrheumatic tricuspid (valve) insufficiency: Secondary | ICD-10-CM | POA: Diagnosis not present

## 2015-09-09 DIAGNOSIS — M1990 Unspecified osteoarthritis, unspecified site: Secondary | ICD-10-CM | POA: Diagnosis not present

## 2015-09-09 DIAGNOSIS — I2510 Atherosclerotic heart disease of native coronary artery without angina pectoris: Secondary | ICD-10-CM | POA: Diagnosis not present

## 2015-09-09 DIAGNOSIS — I10 Essential (primary) hypertension: Secondary | ICD-10-CM | POA: Diagnosis not present

## 2015-09-09 DIAGNOSIS — E785 Hyperlipidemia, unspecified: Secondary | ICD-10-CM | POA: Diagnosis not present

## 2015-09-09 DIAGNOSIS — I340 Nonrheumatic mitral (valve) insufficiency: Secondary | ICD-10-CM | POA: Diagnosis not present

## 2015-09-09 DIAGNOSIS — E669 Obesity, unspecified: Secondary | ICD-10-CM | POA: Diagnosis not present

## 2015-09-09 DIAGNOSIS — D649 Anemia, unspecified: Secondary | ICD-10-CM | POA: Diagnosis not present

## 2015-09-17 DIAGNOSIS — H2511 Age-related nuclear cataract, right eye: Secondary | ICD-10-CM | POA: Diagnosis not present

## 2015-09-18 ENCOUNTER — Other Ambulatory Visit: Payer: Medicare Other

## 2015-09-18 ENCOUNTER — Ambulatory Visit: Payer: Medicare Other

## 2015-09-18 ENCOUNTER — Ambulatory Visit: Payer: Medicare Other | Admitting: Hematology & Oncology

## 2015-09-25 ENCOUNTER — Encounter: Payer: Self-pay | Admitting: Hematology & Oncology

## 2015-09-25 ENCOUNTER — Ambulatory Visit (HOSPITAL_BASED_OUTPATIENT_CLINIC_OR_DEPARTMENT_OTHER): Payer: Medicare Other | Admitting: Family

## 2015-09-25 ENCOUNTER — Other Ambulatory Visit (HOSPITAL_BASED_OUTPATIENT_CLINIC_OR_DEPARTMENT_OTHER): Payer: Medicare Other

## 2015-09-25 ENCOUNTER — Ambulatory Visit: Payer: Medicare Other

## 2015-09-25 VITALS — BP 148/58 | HR 82 | Temp 98.7°F | Resp 18 | Ht 60.0 in | Wt 164.0 lb

## 2015-09-25 DIAGNOSIS — D649 Anemia, unspecified: Secondary | ICD-10-CM

## 2015-09-25 DIAGNOSIS — D509 Iron deficiency anemia, unspecified: Secondary | ICD-10-CM

## 2015-09-25 DIAGNOSIS — D631 Anemia in chronic kidney disease: Secondary | ICD-10-CM

## 2015-09-25 DIAGNOSIS — R5383 Other fatigue: Secondary | ICD-10-CM | POA: Diagnosis not present

## 2015-09-25 DIAGNOSIS — N189 Chronic kidney disease, unspecified: Principal | ICD-10-CM

## 2015-09-25 LAB — COMPREHENSIVE METABOLIC PANEL (CC13)
ALT: 16 U/L (ref 0–55)
AST: 16 U/L (ref 5–34)
Albumin: 3.9 g/dL (ref 3.5–5.0)
Alkaline Phosphatase: 48 U/L (ref 40–150)
Anion Gap: 9 mEq/L (ref 3–11)
BUN: 26.5 mg/dL — AB (ref 7.0–26.0)
CO2: 25 meq/L (ref 22–29)
Calcium: 9.8 mg/dL (ref 8.4–10.4)
Chloride: 107 mEq/L (ref 98–109)
Creatinine: 1.3 mg/dL — ABNORMAL HIGH (ref 0.6–1.1)
EGFR: 46 mL/min/{1.73_m2} — AB (ref >90)
GLUCOSE: 103 mg/dL (ref 70–140)
POTASSIUM: 3.7 meq/L (ref 3.5–5.1)
SODIUM: 141 meq/L (ref 136–145)
TOTAL PROTEIN: 7.4 g/dL (ref 6.4–8.3)
Total Bilirubin: 0.32 mg/dL (ref 0.20–1.20)

## 2015-09-25 LAB — CBC WITH DIFFERENTIAL (CANCER CENTER ONLY)
BASO#: 0 10*3/uL (ref 0.0–0.2)
BASO%: 0.8 % (ref 0.0–2.0)
EOS%: 4.4 % (ref 0.0–7.0)
Eosinophils Absolute: 0.2 10*3/uL (ref 0.0–0.5)
HCT: 33.9 % — ABNORMAL LOW (ref 34.8–46.6)
HEMOGLOBIN: 10.8 g/dL — AB (ref 11.6–15.9)
LYMPH#: 1.1 10*3/uL (ref 0.9–3.3)
LYMPH%: 30.2 % (ref 14.0–48.0)
MCH: 28.3 pg (ref 26.0–34.0)
MCHC: 31.9 g/dL — ABNORMAL LOW (ref 32.0–36.0)
MCV: 89 fL (ref 81–101)
MONO#: 0.5 10*3/uL (ref 0.1–0.9)
MONO%: 12.8 % (ref 0.0–13.0)
NEUT%: 51.8 % (ref 39.6–80.0)
NEUTROS ABS: 1.9 10*3/uL (ref 1.5–6.5)
Platelets: 162 10*3/uL (ref 145–400)
RBC: 3.81 10*6/uL (ref 3.70–5.32)
RDW: 14.6 % (ref 11.1–15.7)
WBC: 3.7 10*3/uL — ABNORMAL LOW (ref 3.9–10.0)

## 2015-09-25 LAB — IRON AND TIBC CHCC
%SAT: 26 % (ref 21–57)
IRON: 66 ug/dL (ref 41–142)
TIBC: 254 ug/dL (ref 236–444)
UIBC: 187 ug/dL (ref 120–384)

## 2015-09-25 LAB — RETICULOCYTES (CHCC)
ABS RETIC: 53.6 10*3/uL (ref 19.0–186.0)
RBC.: 3.83 MIL/uL — ABNORMAL LOW (ref 3.87–5.11)
RETIC CT PCT: 1.4 % (ref 0.4–2.3)

## 2015-09-25 LAB — FERRITIN CHCC: FERRITIN: 977 ng/mL — AB (ref 9–269)

## 2015-09-25 MED ORDER — DARBEPOETIN ALFA 300 MCG/0.6ML IJ SOSY
PREFILLED_SYRINGE | INTRAMUSCULAR | Status: AC
  Filled 2015-09-25: qty 0.6

## 2015-09-25 NOTE — Progress Notes (Signed)
Hematology and Oncology Follow Up Visit  [name] [id number] 1932/05/29 81 y.o. 09/25/2015   Principle Diagnosis:  Anemia of renal insufficiency Intermittent iron deficiency anemia  Current Therapy:   Aranesp 300 mcg subcutaneous as needed for hemoglobin less than 11 IV iron as indicated    Interim History:  Brittany Weber is here today for a follow-up. She has been feeling a little weak. She has had no dizziness or syncope. No falls. She uses a walker to ambulate and does well.  Her hgb today is 10.8 with an MCV of 89. She denies any episodes of bleeding or bruising.  Her last dose of Feraheme in August 2014. Her iron saturation in August was 21% with a ferritin of 1,066. It has also been quite a while since she received Aranesp.  No fever, chills, n/v, cough, rash, SOB, chest pain, palpitations, abdominal pain or changes in her bowel or bladder habits. No lymphadenopathy found on exam.  No numbness or tingling in her extremities. She is still having arthritic pain in her right hip. She needs to have a replacement but has refused. Her appetite is ok. She is staying hydrated with water. Her weight is unchanged since her last visit.   Medications:    Medication List       This list is accurate as of: 09/25/15 12:12 PM.  Always use your most recent med list.               AZOR 10-40 MG tablet  Generic drug:  amLODipine-olmesartan  Take 1 tablet by mouth daily.     calcium carbonate 1250 (500 CA) MG chewable tablet  Commonly known as:  OS-CAL  Chew 1 tablet by mouth daily. Pt takes Calcrate     cilostazol 100 MG tablet  Commonly known as:  PLETAL  Take 100 mg by mouth 2 (two) times daily.     dexamethasone 4 MG tablet  Commonly known as:  DECADRON  Take 4 mg by mouth as needed (for pain).     folic acid 1 MG tablet  Commonly known as:  FOLVITE  Take 1 tablet (1 mg total) by mouth 2 (two) times daily.     ketorolac 0.5 % ophthalmic solution  Commonly known  as:  ACULAR     NON FORMULARY  Take by mouth 3 (three) times a week. Cleanse-more     ofloxacin 0.3 % ophthalmic solution  Commonly known as:  OCUFLOX     PAPAYA ENZYMES PO  Take by mouth 3 (three) times daily after meals.     prednisoLONE acetate 1 % ophthalmic suspension  Commonly known as:  PRED FORTE     vitamin C 500 MG tablet  Commonly known as:  ASCORBIC ACID  Take 500 mg by mouth daily. Take 2 po     VITAMIN D-3 PO  Take by mouth every morning.     VOLTAREN 1 % Gel  Generic drug:  diclofenac sodium  Apply topically 4 (four) times daily.     WHITE WILLOW BARK PO  Take by mouth daily. " Pt states that white willow bark is natural aspirin "        Allergies:  Allergies  Allergen Reactions  . Amoxicillin   . Ampicillin   . Aspirin   . Clarithromycin   . Clarithromycin   . Codeine   . Darvocet [Propoxyphene N-Acetaminophen]   . Demerol   . Diazepam   . Erythromycin   . Indomethacin   .  Iron     Pt states she is "allergic to iron pills"- no anaphylaxis to Iron. Tolerates Feraheme IV  . Latex   . Lisinopril   . Oxycodone   . Sulfa Antibiotics   . Sulfonamide Derivatives   . Valium     Past Medical History, Surgical history, Social history, and Family History were reviewed and updated.  Review of Systems: All other 10 point review of systems is negative.   Physical Exam:  height is 5' (1.524 m) and weight is 164 lb (74.39 kg). Her oral temperature is 98.7 F (37.1 C). Her blood pressure is 148/58 and her pulse is 82. Her respiration is 18.   Wt Readings from Last 3 Encounters:  09/25/15 164 lb (74.39 kg)  07/11/15 164 lb (74.39 kg)  05/29/15 166 lb (75.297 kg)    Ocular: Sclerae unicteric, pupils equal, round and reactive to light Ear-nose-throat: Oropharynx clear, dentition fair Lymphatic: No cervical or supraclavicular adenopathy Lungs no rales or rhonchi, good excursion bilaterally Heart regular rate and rhythm, no murmur appreciated Abd  soft, nontender, positive bowel sounds, no organomegaly  MSK no focal spinal tenderness, no joint edema Neuro: non-focal, well-oriented, appropriate affect Breasts: Deferred  Lab Results  Component Value Date   WBC 3.7* 09/25/2015   HGB 10.8* 09/25/2015   HCT 33.9* 09/25/2015   MCV 89 09/25/2015   PLT 162 09/25/2015   Lab Results  Component Value Date   FERRITIN 1,066* 07/11/2015   IRON 49 07/11/2015   TIBC 236 07/11/2015   UIBC 187 07/11/2015   IRONPCTSAT 21 07/11/2015   Lab Results  Component Value Date   RETICCTPCT 1.1 07/11/2015   RBC 3.81 09/25/2015   RETICCTABS 40.5 07/11/2015   No results found for: KPAFRELGTCHN, LAMBDASER, KAPLAMBRATIO No results found for: IGGSERUM, IGA, IGMSERUM No results found for: Marda Stalker, SPEI   Chemistry      Component Value Date/Time   NA 142 07/11/2015 1212   K 4.1 07/11/2015 1212   CL 105 07/11/2015 1212   CO2 27 07/11/2015 1212   BUN 19 07/11/2015 1212   CREATININE 1.29* 07/11/2015 1212      Component Value Date/Time   CALCIUM 9.5 07/11/2015 1212   ALKPHOS 44 07/11/2015 1212   AST 16 07/11/2015 1212   ALT 13 07/11/2015 1212   BILITOT 0.3 07/11/2015 1212     Impression and Plan: Brittany Weber is an 81 year old African American female with multifactorial anemia.  She continues to have some weakness and fatigue at times.  She has not needed iron in over 2 years. Her Hgb is holding at 10.8. She has had no episodes of bleeding.  We will hold off on giving her Aranesp today. We will see what her iron studies show and bring her in later this week for Feraheme if needed.  We will plan to see her back in 3 months for labs and follow-up.  She will contact us with any questions or concerns. We can certainly see her sooner if need be.   Verdie Mosher, NP 10/19/201612:12 PM

## 2015-09-30 DIAGNOSIS — M179 Osteoarthritis of knee, unspecified: Secondary | ICD-10-CM | POA: Diagnosis not present

## 2015-10-18 ENCOUNTER — Encounter: Payer: Self-pay | Admitting: Podiatry

## 2015-10-18 ENCOUNTER — Ambulatory Visit (INDEPENDENT_AMBULATORY_CARE_PROVIDER_SITE_OTHER): Payer: Medicare Other | Admitting: Podiatry

## 2015-10-18 DIAGNOSIS — M79673 Pain in unspecified foot: Secondary | ICD-10-CM

## 2015-10-18 DIAGNOSIS — B351 Tinea unguium: Secondary | ICD-10-CM | POA: Diagnosis not present

## 2015-10-18 NOTE — Progress Notes (Signed)
Patient ID: Brittany BastDoris J Villela, female   DOB: 06-02-1932, 81 y.o.   MRN: [id number] Complaint:  Visit Type: Patient returns to my office for continued preventative foot care services. Complaint: Patient states" my nails have grown long and thick and become painful to walk and wear shoes" . The patient presents for preventative foot care services. No changes to ROS  Podiatric Exam: Vascular: dorsalis pedis and posterior tibial pulses are palpable bilateral. Capillary return is immediate. Temperature gradient is WNL. Skin turgor WNL  Sensorium: Normal Semmes Weinstein monofilament test. Normal tactile sensation bilaterally. Nail Exam: Pt has thick disfigured discolored nails with subungual debris noted bilateral entire nail hallux through fifth toenails Ulcer Exam: There is no evidence of ulcer or pre-ulcerative changes or infection. Orthopedic Exam: Muscle tone and strength are WNL. No limitations in general ROM. No crepitus or effusions noted. Foot type and digits show no abnormalities. Bony prominences are unremarkable.Midfoot arthritis B/L.Skin: No Porokeratosis. No infection or ulcers  Diagnosis:  Onychomycosis, , Pain in right toe, pain in left toes  Treatment & Plan Procedures and Treatment: Consent by patient was obtained for treatment procedures. The patient understood the discussion of treatment and procedures well. All questions were answered thoroughly reviewed. Debridement of mycotic and hypertrophic toenails, 1 through 5 bilateral and clearing of subungual debris. No ulceration, no infection noted.  Return Visit-Office Procedure: Patient instructed to return to the office for a follow up visit 3 months for continued evaluation and treatment.  Helane GuntherGregory London Nonaka DPM

## 2015-10-24 ENCOUNTER — Other Ambulatory Visit: Payer: Self-pay | Admitting: Nurse Practitioner

## 2015-10-24 DIAGNOSIS — D529 Folate deficiency anemia, unspecified: Secondary | ICD-10-CM

## 2015-10-24 MED ORDER — FOLIC ACID 1 MG PO TABS: 1.0000 mg | ORAL_TABLET | Freq: Two times a day (BID) | ORAL | Status: DC

## 2015-12-11 DIAGNOSIS — E559 Vitamin D deficiency, unspecified: Secondary | ICD-10-CM | POA: Diagnosis not present

## 2015-12-11 DIAGNOSIS — I10 Essential (primary) hypertension: Secondary | ICD-10-CM | POA: Diagnosis not present

## 2015-12-11 DIAGNOSIS — D649 Anemia, unspecified: Secondary | ICD-10-CM | POA: Diagnosis not present

## 2015-12-11 DIAGNOSIS — E785 Hyperlipidemia, unspecified: Secondary | ICD-10-CM | POA: Diagnosis not present

## 2015-12-12 DIAGNOSIS — R0609 Other forms of dyspnea: Secondary | ICD-10-CM | POA: Diagnosis not present

## 2015-12-12 DIAGNOSIS — M1990 Unspecified osteoarthritis, unspecified site: Secondary | ICD-10-CM | POA: Diagnosis not present

## 2015-12-12 DIAGNOSIS — E559 Vitamin D deficiency, unspecified: Secondary | ICD-10-CM | POA: Diagnosis not present

## 2015-12-12 DIAGNOSIS — I340 Nonrheumatic mitral (valve) insufficiency: Secondary | ICD-10-CM | POA: Diagnosis not present

## 2015-12-12 DIAGNOSIS — I129 Hypertensive chronic kidney disease with stage 1 through stage 4 chronic kidney disease, or unspecified chronic kidney disease: Secondary | ICD-10-CM | POA: Diagnosis not present

## 2015-12-12 DIAGNOSIS — I2510 Atherosclerotic heart disease of native coronary artery without angina pectoris: Secondary | ICD-10-CM | POA: Diagnosis not present

## 2015-12-12 DIAGNOSIS — R011 Cardiac murmur, unspecified: Secondary | ICD-10-CM | POA: Diagnosis not present

## 2015-12-12 DIAGNOSIS — I361 Nonrheumatic tricuspid (valve) insufficiency: Secondary | ICD-10-CM | POA: Diagnosis not present

## 2015-12-12 DIAGNOSIS — E785 Hyperlipidemia, unspecified: Secondary | ICD-10-CM | POA: Diagnosis not present

## 2015-12-19 DIAGNOSIS — R0609 Other forms of dyspnea: Secondary | ICD-10-CM | POA: Diagnosis not present

## 2015-12-19 DIAGNOSIS — E559 Vitamin D deficiency, unspecified: Secondary | ICD-10-CM | POA: Diagnosis not present

## 2015-12-19 DIAGNOSIS — N189 Chronic kidney disease, unspecified: Secondary | ICD-10-CM | POA: Diagnosis not present

## 2015-12-19 DIAGNOSIS — I340 Nonrheumatic mitral (valve) insufficiency: Secondary | ICD-10-CM | POA: Diagnosis not present

## 2015-12-19 DIAGNOSIS — I2510 Atherosclerotic heart disease of native coronary artery without angina pectoris: Secondary | ICD-10-CM | POA: Diagnosis not present

## 2015-12-19 DIAGNOSIS — R011 Cardiac murmur, unspecified: Secondary | ICD-10-CM | POA: Diagnosis not present

## 2015-12-19 DIAGNOSIS — D649 Anemia, unspecified: Secondary | ICD-10-CM | POA: Diagnosis not present

## 2015-12-19 DIAGNOSIS — I129 Hypertensive chronic kidney disease with stage 1 through stage 4 chronic kidney disease, or unspecified chronic kidney disease: Secondary | ICD-10-CM | POA: Diagnosis not present

## 2015-12-19 DIAGNOSIS — I361 Nonrheumatic tricuspid (valve) insufficiency: Secondary | ICD-10-CM | POA: Diagnosis not present

## 2015-12-26 ENCOUNTER — Encounter: Payer: Self-pay | Admitting: Podiatry

## 2015-12-26 ENCOUNTER — Ambulatory Visit (INDEPENDENT_AMBULATORY_CARE_PROVIDER_SITE_OTHER): Payer: Medicare Other | Admitting: Podiatry

## 2015-12-26 DIAGNOSIS — B351 Tinea unguium: Secondary | ICD-10-CM | POA: Diagnosis not present

## 2015-12-26 DIAGNOSIS — M79673 Pain in unspecified foot: Secondary | ICD-10-CM | POA: Diagnosis not present

## 2015-12-26 NOTE — Progress Notes (Signed)
Patient ID: Brittany Weber, female   DOB: 12/20/1931, 83 y.o.   MRN: [id number] Complaint:  Visit Type: Patient returns to my office for continued preventative foot care services. Complaint: Patient states" my nails have grown long and thick and become painful to walk and wear shoes" . The patient presents for preventative foot care services. No changes to ROS  Podiatric Exam: Vascular: dorsalis pedis and posterior tibial pulses are palpable bilateral. Capillary return is immediate. Temperature gradient is WNL. Skin turgor WNL  Sensorium: Normal Semmes Weinstein monofilament test. Normal tactile sensation bilaterally. Nail Exam: Pt has thick disfigured discolored nails with subungual debris noted bilateral entire nail hallux through fifth toenails Ulcer Exam: There is no evidence of ulcer or pre-ulcerative changes or infection. Orthopedic Exam: Muscle tone and strength are WNL. No limitations in general ROM. No crepitus or effusions noted. Foot type and digits show no abnormalities. Bony prominences are unremarkable.Midfoot arthritis B/L.Skin: No Porokeratosis. No infection or ulcers  Diagnosis:  Onychomycosis, , Pain in right toe, pain in left toes  Treatment & Plan Procedures and Treatment: Consent by patient was obtained for treatment procedures. The patient understood the discussion of treatment and procedures well. All questions were answered thoroughly reviewed. Debridement of mycotic and hypertrophic toenails, 1 through 5 bilateral and clearing of subungual debris. No ulceration, no infection noted.  Return Visit-Office Procedure: Patient instructed to return to the office for a follow up visit 3 months for continued evaluation and treatment.  [name] DPM 

## 2016-01-01 ENCOUNTER — Ambulatory Visit (HOSPITAL_BASED_OUTPATIENT_CLINIC_OR_DEPARTMENT_OTHER): Payer: Medicare Other

## 2016-01-01 ENCOUNTER — Encounter: Payer: Self-pay | Admitting: Hematology & Oncology

## 2016-01-01 ENCOUNTER — Ambulatory Visit (HOSPITAL_BASED_OUTPATIENT_CLINIC_OR_DEPARTMENT_OTHER): Payer: Medicare Other | Admitting: Hematology & Oncology

## 2016-01-01 ENCOUNTER — Other Ambulatory Visit (HOSPITAL_BASED_OUTPATIENT_CLINIC_OR_DEPARTMENT_OTHER): Payer: Medicare Other

## 2016-01-01 VITALS — BP 139/55 | HR 86 | Temp 98.4°F | Resp 14 | Ht 60.0 in | Wt 167.0 lb

## 2016-01-01 DIAGNOSIS — N189 Chronic kidney disease, unspecified: Secondary | ICD-10-CM | POA: Diagnosis not present

## 2016-01-01 DIAGNOSIS — D509 Iron deficiency anemia, unspecified: Secondary | ICD-10-CM

## 2016-01-01 DIAGNOSIS — D631 Anemia in chronic kidney disease: Secondary | ICD-10-CM

## 2016-01-01 DIAGNOSIS — D649 Anemia, unspecified: Secondary | ICD-10-CM

## 2016-01-01 DIAGNOSIS — N289 Disorder of kidney and ureter, unspecified: Secondary | ICD-10-CM

## 2016-01-01 LAB — CBC WITH DIFFERENTIAL (CANCER CENTER ONLY)
BASO#: 0 10*3/uL (ref 0.0–0.2)
BASO%: 0.7 % (ref 0.0–2.0)
EOS%: 4.8 % (ref 0.0–7.0)
Eosinophils Absolute: 0.2 10*3/uL (ref 0.0–0.5)
HCT: 33.8 % — ABNORMAL LOW (ref 34.8–46.6)
HGB: 10.7 g/dL — ABNORMAL LOW (ref 11.6–15.9)
LYMPH#: 1.5 10*3/uL (ref 0.9–3.3)
LYMPH%: 33.9 % (ref 14.0–48.0)
MCH: 28 pg (ref 26.0–34.0)
MCHC: 31.7 g/dL — ABNORMAL LOW (ref 32.0–36.0)
MCV: 89 fL (ref 81–101)
MONO#: 0.6 10*3/uL (ref 0.1–0.9)
MONO%: 13.3 % — ABNORMAL HIGH (ref 0.0–13.0)
NEUT#: 2.1 10*3/uL (ref 1.5–6.5)
NEUT%: 47.3 % (ref 39.6–80.0)
Platelets: 152 10*3/uL (ref 145–400)
RBC: 3.82 10*6/uL (ref 3.70–5.32)
RDW: 14.8 % (ref 11.1–15.7)
WBC: 4.4 10*3/uL (ref 3.9–10.0)

## 2016-01-01 LAB — COMPREHENSIVE METABOLIC PANEL
ALBUMIN: 4.1 g/dL (ref 3.5–5.0)
ALT: 12 U/L (ref 0–55)
ANION GAP: 8 meq/L (ref 3–11)
AST: 14 U/L (ref 5–34)
Alkaline Phosphatase: 52 U/L (ref 40–150)
BUN: 22.6 mg/dL (ref 7.0–26.0)
CALCIUM: 9.9 mg/dL (ref 8.4–10.4)
CO2: 26 mEq/L (ref 22–29)
CREATININE: 1.4 mg/dL — AB (ref 0.6–1.1)
Chloride: 109 mEq/L (ref 98–109)
EGFR: 42 mL/min/{1.73_m2} — ABNORMAL LOW (ref >90)
Glucose: 98 mg/dl (ref 70–140)
Potassium: 3.9 mEq/L (ref 3.5–5.1)
Sodium: 142 mEq/L (ref 136–145)
TOTAL PROTEIN: 7.9 g/dL (ref 6.4–8.3)

## 2016-01-01 LAB — FERRITIN: Ferritin: 970 ng/ml — ABNORMAL HIGH (ref 9–269)

## 2016-01-01 LAB — IRON AND TIBC
%SAT: 24 % (ref 21–57)
Iron: 63 ug/dL (ref 41–142)
TIBC: 258 ug/dL (ref 236–444)
UIBC: 195 ug/dL (ref 120–384)

## 2016-01-01 MED ORDER — DARBEPOETIN ALFA 300 MCG/0.6ML IJ SOSY
PREFILLED_SYRINGE | INTRAMUSCULAR | Status: AC
  Filled 2016-01-01: qty 0.6

## 2016-01-01 MED ORDER — DARBEPOETIN ALFA 300 MCG/0.6ML IJ SOSY
300.0000 ug | PREFILLED_SYRINGE | Freq: Once | INTRAMUSCULAR | Status: AC
  Administered 2016-01-01: 300 ug via SUBCUTANEOUS

## 2016-01-01 NOTE — Progress Notes (Signed)
Hematology and Oncology Follow Up Visit  Brittany Weber [id number] 20-Dec-1931 82 y.o. 01/01/2016   Principle Diagnosis:   Anemia of renal insufficiency  Intermittent iron deficiency anemia  Current Therapy:    Aranesp 300 mcg subcutaneous as needed for hemoglobin less than 11 - dose given on 01/01/2016  IV iron as indicated     Interim History:  Brittany Weber is back for followup. She's doing okay. She had a pretty decent and quiet holiday season.   She still had a lot of problems with her back and hips however, she still does not want to have any surgery. She feels that this actually is getting a little bit better.  We last saw her back in October. At that point in time, her ferritin was 977 with an iron saturation of 26%. Again, most of the ferritin elevation is secondary to chronic inflammation from her bad arthritis, that she has in the right hip.  Her appetite has been okay. She eats 2 meals a day. She has had no problems with nausea or vomiting. There's not been any change in bowel or bladder habits.  . She's had no bleeding. She had no fever. She's had no cough or shortness of breath. There's been no leg swelling. There's been no rashes.  She still refuses to have a chemosurgery for the right hip. She gets around a rolling walker as long as she does is, she is happy.  Overall, her performance status is ECOG 2-3  Medications:  Current outpatient prescriptions:  .  amLODipine-olmesartan (AZOR) 10-40 MG per tablet, Take 1 tablet by mouth daily., Disp: , Rfl:  .  calcium carbonate (OS-CAL) 1250 MG chewable tablet, Chew 1 tablet by mouth daily. Pt takes Calcrate, Disp: , Rfl:  .  Cholecalciferol (VITAMIN D-3 PO), Take by mouth every morning., Disp: , Rfl:  .  cilostazol (PLETAL) 100 MG tablet, Take 100 mg by mouth 2 (two) times daily. , Disp: , Rfl:  .  dexamethasone (DECADRON) 4 MG tablet, Take 4 mg by mouth as needed (for pain)., Disp: , Rfl:  .  diclofenac sodium  (VOLTAREN) 1 % GEL, Apply topically 4 (four) times daily., Disp: , Rfl:  .  folic acid (FOLVITE) 1 MG tablet, Take 1 tablet (1 mg total) by mouth 2 (two) times daily., Disp: 60 tablet, Rfl: 11 .  ketorolac (ACULAR) 0.5 % ophthalmic solution, , Disp: , Rfl:  .  Misc Natural Products (WHITE WILLOW BARK PO), Take by mouth daily. " Pt states that white willow bark is natural aspirin ", Disp: , Rfl:  .  NON FORMULARY, Take by mouth 3 (three) times a week. Cleanse-more, Disp: , Rfl:  .  ofloxacin (OCUFLOX) 0.3 % ophthalmic solution, , Disp: , Rfl:  .  PAPAYA ENZYMES PO, Take by mouth 3 (three) times daily after meals., Disp: , Rfl:  .  prednisoLONE acetate (PRED FORTE) 1 % ophthalmic suspension, , Disp: , Rfl:  .  vitamin C (ASCORBIC ACID) 500 MG tablet, Take 500 mg by mouth daily. Take 2 po, Disp: , Rfl:  No current facility-administered medications for this visit.  Facility-Administered Medications Ordered in Other Visits:  .  Darbepoetin Alfa (ARANESP) injection 300 mcg, 300 mcg, Subcutaneous, Once, [name], MD  Allergies:  Allergies  Allergen Reactions  . Amoxicillin   . Ampicillin   . Aspirin   . Clarithromycin   . Clarithromycin   . Codeine   . Darvocet [Propoxyphene N-Acetaminophen]   . Demerol   .  Diazepam   . Erythromycin   . Indomethacin   . Iron     Pt states she is "allergic to iron pills"- no anaphylaxis to Iron. Tolerates Feraheme IV  . Latex   . Lisinopril   . Oxycodone   . Sulfa Antibiotics   . Sulfonamide Derivatives   . Valium     Past Medical History, Surgical history, Social history, and Family History were reviewed and updated.  Review of Systems: As above  Physical Exam:  height is 5' (1.524 m) and weight is 167 lb (75.751 kg). Her oral temperature is 98.4 F (36.9 C). Her blood pressure is 139/55 and her pulse is 86. Her respiration is 14.   Somewhat elderly African American female. Her head and neck exam shows no ocular or oral lesions. She  has no palpable thyroid. There is no adenopathy in the cervical or supraclavicular regions. Lungs are clear. Cardiac exam regular rate and rhythm with no murmurs, rubs or bruits. Abdomen is soft. She has decent bowel sounds. There is no fluid wave. There is no palpable liver or spleen. Back exam no tenderness over the spine, ribs or hips. Extremities shows a brace on the right leg. She has mild edema in her lower legs. Skin exam no rashes, ecchymosis or petechia. Neurological exam shows no focal neurological deficits.  Lab Results  Component Value Date   WBC 4.4 01/01/2016   HGB 10.7* 01/01/2016   HCT 33.8* 01/01/2016   MCV 89 01/01/2016   PLT 152 01/01/2016     Chemistry      Component Value Date/Time   NA 141 09/25/2015 1116   NA 142 07/11/2015 1212      Component Value Date/Time   CALCIUM 9.8 09/25/2015 1116   CALCIUM 9.5 07/11/2015 1212         Impression and Plan: Brittany Weber is an 82 year old American female. She has multifactorial anemia. Her hemoglobin is holding relatively steady. She will  need Aranesp today. She's we will see her every couple months or so, I think that Aranesp would help get her through the wintertime. I am glad that the blood count has stabilized.  We will see her iron studies show. Her ferritin is mostly an acute phase reactant from her arthritic issues.  I will plan to get her back in 2 months.   [name], MD 1/25/201712:39 PM

## 2016-01-01 NOTE — Patient Instructions (Signed)
Darbepoetin Alfa injection What is this medicine? DARBEPOETIN ALFA (dar be POE e tin AL fa) helps your body make more red blood cells. It is used to treat anemia caused by chronic kidney failure and chemotherapy. This medicine may be used for other purposes; ask your health care provider or pharmacist if you have questions. What should I tell my health care provider before I take this medicine? They need to know if you have any of these conditions: -blood clotting disorders or history of blood clots -cancer patient not on chemotherapy -cystic fibrosis -heart disease, such as angina, heart failure, or a history of a heart attack -hemoglobin level of 12 g/dL or greater -high blood pressure -low levels of folate, iron, or vitamin B12 -seizures -an unusual or allergic reaction to darbepoetin, erythropoietin, albumin, hamster proteins, latex, other medicines, foods, dyes, or preservatives -pregnant or trying to get pregnant -breast-feeding How should I use this medicine? This medicine is for injection into a vein or under the skin. It is usually given by a health care professional in a hospital or clinic setting. If you get this medicine at home, you will be taught how to prepare and give this medicine. Do not shake the solution before you withdraw a dose. Use exactly as directed. Take your medicine at regular intervals. Do not take your medicine more often than directed. It is important that you put your used needles and syringes in a special sharps container. Do not put them in a trash can. If you do not have a sharps container, call your pharmacist or healthcare provider to get one. Talk to your pediatrician regarding the use of this medicine in children. While this medicine may be used in children as young as 1 year for selected conditions, precautions do apply. Overdosage: If you think you have taken too much of this medicine contact a poison control center or emergency room at once. NOTE:  This medicine is only for you. Do not share this medicine with others. What if I miss a dose? If you miss a dose, take it as soon as you can. If it is almost time for your next dose, take only that dose. Do not take double or extra doses. What may interact with this medicine? Do not take this medicine with any of the following medications: -epoetin alfa This list may not describe all possible interactions. Give your health care provider a list of all the medicines, herbs, non-prescription drugs, or dietary supplements you use. Also tell them if you smoke, drink alcohol, or use illegal drugs. Some items may interact with your medicine. What should I watch for while using this medicine? Visit your prescriber or health care professional for regular checks on your progress and for the needed blood tests and blood pressure measurements. It is especially important for the doctor to make sure your hemoglobin level is in the desired range, to limit the risk of potential side effects and to give you the best benefit. Keep all appointments for any recommended tests. Check your blood pressure as directed. Ask your doctor what your blood pressure should be and when you should contact him or her. As your body makes more red blood cells, you may need to take iron, folic acid, or vitamin B supplements. Ask your doctor or health care provider which products are right for you. If you have kidney disease continue dietary restrictions, even though this medication can make you feel better. Talk with your doctor or health care professional about the   foods you eat and the vitamins that you take. What side effects may I notice from receiving this medicine? Side effects that you should report to your doctor or health care professional as soon as possible: -allergic reactions like skin rash, itching or hives, swelling of the face, lips, or tongue -breathing problems -changes in vision -chest pain -confusion, trouble speaking  or understanding -feeling faint or lightheaded, falls -high blood pressure -muscle aches or pains -pain, swelling, warmth in the leg -rapid weight gain -severe headaches -sudden numbness or weakness of the face, arm or leg -trouble walking, dizziness, loss of balance or coordination -seizures (convulsions) -swelling of the ankles, feet, hands -unusually weak or tired Side effects that usually do not require medical attention (report to your doctor or health care professional if they continue or are bothersome): -diarrhea -fever, chills (flu-like symptoms) -headaches -nausea, vomiting -redness, stinging, or swelling at site where injected This list may not describe all possible side effects. Call your doctor for medical advice about side effects. You may report side effects to FDA at 1-800-FDA-1088. Where should I keep my medicine? Keep out of the reach of children. Store in a refrigerator between 2 and 8 degrees C (36 and 46 degrees F). Do not freeze. Do not shake. Throw away any unused portion if using a single-dose vial. Throw away any unused medicine after the expiration date. NOTE: This sheet is a summary. It may not cover all possible information. If you have questions about this medicine, talk to your doctor, pharmacist, or health care provider.    2016, [employer]vier/[employer] Standard. (2008-11-06 10:23:57)  

## 2016-01-02 ENCOUNTER — Encounter: Payer: Self-pay | Admitting: Hematology & Oncology

## 2016-01-02 LAB — RETICULOCYTES: Reticulocyte Count: 1 % (ref 0.6–2.6)

## 2016-01-17 DIAGNOSIS — H04123 Dry eye syndrome of bilateral lacrimal glands: Secondary | ICD-10-CM | POA: Diagnosis not present

## 2016-01-17 DIAGNOSIS — H40023 Open angle with borderline findings, high risk, bilateral: Secondary | ICD-10-CM | POA: Diagnosis not present

## 2016-03-03 ENCOUNTER — Ambulatory Visit: Payer: Medicare Other | Admitting: Hematology & Oncology

## 2016-03-03 ENCOUNTER — Other Ambulatory Visit: Payer: Medicare Other

## 2016-03-03 ENCOUNTER — Ambulatory Visit: Payer: Medicare Other

## 2016-03-12 DIAGNOSIS — I361 Nonrheumatic tricuspid (valve) insufficiency: Secondary | ICD-10-CM | POA: Diagnosis not present

## 2016-03-12 DIAGNOSIS — I1310 Hypertensive heart and chronic kidney disease without heart failure, with stage 1 through stage 4 chronic kidney disease, or unspecified chronic kidney disease: Secondary | ICD-10-CM | POA: Diagnosis not present

## 2016-03-12 DIAGNOSIS — E559 Vitamin D deficiency, unspecified: Secondary | ICD-10-CM | POA: Diagnosis not present

## 2016-03-12 DIAGNOSIS — M1990 Unspecified osteoarthritis, unspecified site: Secondary | ICD-10-CM | POA: Diagnosis not present

## 2016-03-12 DIAGNOSIS — D649 Anemia, unspecified: Secondary | ICD-10-CM | POA: Diagnosis not present

## 2016-03-12 DIAGNOSIS — N189 Chronic kidney disease, unspecified: Secondary | ICD-10-CM | POA: Diagnosis not present

## 2016-03-12 DIAGNOSIS — E785 Hyperlipidemia, unspecified: Secondary | ICD-10-CM | POA: Diagnosis not present

## 2016-03-13 ENCOUNTER — Ambulatory Visit (HOSPITAL_BASED_OUTPATIENT_CLINIC_OR_DEPARTMENT_OTHER): Payer: Medicare Other | Admitting: Hematology & Oncology

## 2016-03-13 ENCOUNTER — Encounter: Payer: Self-pay | Admitting: Hematology & Oncology

## 2016-03-13 ENCOUNTER — Other Ambulatory Visit (HOSPITAL_BASED_OUTPATIENT_CLINIC_OR_DEPARTMENT_OTHER): Payer: Medicare Other

## 2016-03-13 ENCOUNTER — Ambulatory Visit: Payer: Medicare Other

## 2016-03-13 DIAGNOSIS — D61818 Other pancytopenia: Secondary | ICD-10-CM

## 2016-03-13 DIAGNOSIS — D509 Iron deficiency anemia, unspecified: Secondary | ICD-10-CM

## 2016-03-13 DIAGNOSIS — D631 Anemia in chronic kidney disease: Secondary | ICD-10-CM | POA: Diagnosis not present

## 2016-03-13 DIAGNOSIS — N189 Chronic kidney disease, unspecified: Secondary | ICD-10-CM

## 2016-03-13 LAB — COMPREHENSIVE METABOLIC PANEL
ALBUMIN: 4.1 g/dL (ref 3.5–5.0)
ALT: 16 U/L (ref 0–55)
AST: 18 U/L (ref 5–34)
Alkaline Phosphatase: 50 U/L (ref 40–150)
Anion Gap: 10 mEq/L (ref 3–11)
BILIRUBIN TOTAL: 0.31 mg/dL (ref 0.20–1.20)
BUN: 18.8 mg/dL (ref 7.0–26.0)
CO2: 24 meq/L (ref 22–29)
CREATININE: 1.4 mg/dL — AB (ref 0.6–1.1)
Calcium: 9.9 mg/dL (ref 8.4–10.4)
Chloride: 109 mEq/L (ref 98–109)
EGFR: 41 mL/min/{1.73_m2} — ABNORMAL LOW (ref >90)
GLUCOSE: 116 mg/dL (ref 70–140)
Potassium: 3.6 mEq/L (ref 3.5–5.1)
SODIUM: 144 meq/L (ref 136–145)
TOTAL PROTEIN: 7.9 g/dL (ref 6.4–8.3)

## 2016-03-13 LAB — CBC WITH DIFFERENTIAL (CANCER CENTER ONLY)
BASO#: 0 10*3/uL (ref 0.0–0.2)
BASO%: 0.6 % (ref 0.0–2.0)
EOS%: 3.2 % (ref 0.0–7.0)
Eosinophils Absolute: 0.2 10*3/uL (ref 0.0–0.5)
HEMATOCRIT: 34.1 % — AB (ref 34.8–46.6)
HEMOGLOBIN: 11 g/dL — AB (ref 11.6–15.9)
LYMPH#: 1.5 10*3/uL (ref 0.9–3.3)
LYMPH%: 31.9 % (ref 14.0–48.0)
MCH: 28.1 pg (ref 26.0–34.0)
MCHC: 32.3 g/dL (ref 32.0–36.0)
MCV: 87 fL (ref 81–101)
MONO#: 0.5 10*3/uL (ref 0.1–0.9)
MONO%: 11.4 % (ref 0.0–13.0)
NEUT%: 52.9 % (ref 39.6–80.0)
NEUTROS ABS: 2.5 10*3/uL (ref 1.5–6.5)
Platelets: 180 10*3/uL (ref 145–400)
RBC: 3.91 10*6/uL (ref 3.70–5.32)
RDW: 16.1 % — ABNORMAL HIGH (ref 11.1–15.7)
WBC: 4.7 10*3/uL (ref 3.9–10.0)

## 2016-03-13 NOTE — Progress Notes (Signed)
Hematology and Oncology Follow Up Visit  Brittany Weber [id number] [birth year] 82 y.o. 03/13/2016   Principle Diagnosis:   Anemia of renal insufficiency  Intermittent iron deficiency anemia  Current Therapy:    Aranesp 300 mcg subcutaneous as needed for hemoglobin less than 11 - dose given on 01/01/2016  IV iron as indicated     Interim History:  Brittany Weber is back for followup. She's doing okay. She had a pretty decent and quiet winter season.   She still had a lot of problems with her back and hips however, she still does not want to have any surgery. She feels that this actually is getting a little bit better.  We last saw her back in January. At that point in time, her ferritin was 970 with an iron saturation of 24%. Again, most of the ferritin elevation is secondary to chronic inflammation from her bad arthritis, mostly what is in the right hip.  Her appetite has been okay. She eats 2 meals a day. She has had no problems with nausea or vomiting. There's not been any change in bowel or bladder habits.  She's had no bleeding. She had no fever. She's had no cough or shortness of breath. There's been no leg swelling. There's been no rashes.  She still refuses to have a orthopedic surgery for the right hip. She gets around a rolling walker as long as she does is, she is happy.  Overall, her performance status is ECOG 2-3  Medications:  Current outpatient prescriptions:  .  amLODipine-olmesartan (AZOR) 10-40 MG per tablet, Take 1 tablet by mouth daily., Disp: , Rfl:  .  calcium carbonate (OS-CAL) 1250 MG chewable tablet, Chew 1 tablet by mouth daily. Pt takes Calcrate, Disp: , Rfl:  .  Cholecalciferol (VITAMIN D-3 PO), Take by mouth every morning., Disp: , Rfl:  .  cilostazol (PLETAL) 100 MG tablet, Take 100 mg by mouth 2 (two) times daily. , Disp: , Rfl:  .  dexamethasone (DECADRON) 4 MG tablet, Take 4 mg by mouth as needed (for pain)., Disp: , Rfl:  .  diclofenac sodium  (VOLTAREN) 1 % GEL, Apply topically 4 (four) times daily., Disp: , Rfl:  .  folic acid (FOLVITE) 1 MG tablet, Take 1 tablet (1 mg total) by mouth 2 (two) times daily., Disp: 60 tablet, Rfl: 11 .  Misc Natural Products (WHITE WILLOW BARK PO), Take by mouth daily. " Pt states that white willow bark is natural aspirin ", Disp: , Rfl:  .  NON FORMULARY, Take by mouth 3 (three) times a week. Cleanse-more, Disp: , Rfl:  .  PAPAYA ENZYMES PO, Take by mouth 3 (three) times daily after meals., Disp: , Rfl:  .  prednisoLONE acetate (PRED FORTE) 1 % ophthalmic suspension, , Disp: , Rfl:  .  vitamin C (ASCORBIC ACID) 500 MG tablet, Take 500 mg by mouth daily. Take 2 po, Disp: , Rfl:   Allergies:  Allergies  Allergen Reactions  . Amoxicillin   . Ampicillin   . Aspirin   . Clarithromycin   . Clarithromycin   . Codeine   . Darvocet [Propoxyphene N-Acetaminophen]   . Demerol   . Diazepam   . Erythromycin   . Indomethacin   . Iron     Pt states she is "allergic to iron pills"- no anaphylaxis to Iron. Tolerates Feraheme IV  . Latex   . Lisinopril   . Oxycodone   . Sulfa Antibiotics   . Sulfonamide Derivatives   .  Valium     Past Medical History, Surgical history, Social history, and Family History were reviewed and updated.  Review of Systems: As above  Physical Exam:  vitals were not taken for this visit.  Somewhat elderly African American female. Her head and neck exam shows no ocular or oral lesions. She has no palpable thyroid. There is no adenopathy in the cervical or supraclavicular regions. Lungs are clear. Cardiac exam regular rate and rhythm with no murmurs, rubs or bruits. Abdomen is soft. She has decent bowel sounds. There is no fluid wave. There is no palpable liver or spleen. Back exam no tenderness over the spine, ribs or hips. Extremities shows a brace on the right leg. She has mild edema in her lower legs. Skin exam no rashes, ecchymosis or petechia. Neurological exam shows no  focal neurological deficits.  Lab Results  Component Value Date   WBC 4.7 03/13/2016   HGB 11.0* 03/13/2016   HCT 34.1* 03/13/2016   MCV 87 03/13/2016   PLT 180 03/13/2016     Chemistry      Component Value Date/Time   NA 142 01/01/2016 1140   NA 142 07/11/2015 1212      Component Value Date/Time   CALCIUM 9.9 01/01/2016 1140   CALCIUM 9.5 07/11/2015 1212         Impression and Plan: Ms. Kohn is an 82 year old African-American female. She has multifactorial anemia. Her hemoglobin is holding relatively steady. She will not  need Aranesp today.  I do not think that she would need any iron today.  I think we probably get her back in 3 months. I think this would be reasonable.   [name], MD 4/7/20171:16 PM

## 2016-03-14 LAB — RETICULOCYTES: Reticulocyte Count: 1.1 % (ref 0.6–2.6)

## 2016-03-16 ENCOUNTER — Telehealth: Payer: Self-pay | Admitting: Nurse Practitioner

## 2016-03-16 LAB — IRON AND TIBC
%SAT: 28 % (ref 21–57)
Iron: 67 ug/dL (ref 41–142)
TIBC: 239 ug/dL (ref 236–444)
UIBC: 172 ug/dL (ref 120–384)

## 2016-03-16 LAB — FERRITIN

## 2016-03-16 NOTE — Telephone Encounter (Addendum)
Pt verbalized understanding and appreciation ----- Message from Josph MachoPeter R Ennever, MD sent at 03/16/2016  1:23 PM EDT ----- Call - iron is ok!!  Brittany Weber

## 2016-03-26 ENCOUNTER — Ambulatory Visit (INDEPENDENT_AMBULATORY_CARE_PROVIDER_SITE_OTHER): Payer: Medicare Other | Admitting: Podiatry

## 2016-03-26 ENCOUNTER — Encounter: Payer: Self-pay | Admitting: Podiatry

## 2016-03-26 DIAGNOSIS — M79673 Pain in unspecified foot: Secondary | ICD-10-CM | POA: Diagnosis not present

## 2016-03-26 DIAGNOSIS — B351 Tinea unguium: Secondary | ICD-10-CM | POA: Diagnosis not present

## 2016-03-26 NOTE — Progress Notes (Signed)
Patient ID: Brittany Weber, female   DOB: 05/20/1932, 82 y.o.   MRN: [id number] Complaint:  Visit Type: Patient returns to my office for continued preventative foot care services. Complaint: Patient states" my nails have grown long and thick and become painful to walk and wear shoes" . The patient presents for preventative foot care services. No changes to ROS  Podiatric Exam: Vascular: dorsalis pedis and posterior tibial pulses are palpable bilateral. Capillary return is immediate. Temperature gradient is WNL. Skin turgor WNL  Sensorium: Normal Semmes Weinstein monofilament test. Normal tactile sensation bilaterally. Nail Exam: Pt has thick disfigured discolored nails with subungual debris noted bilateral entire nail hallux through fifth toenails Ulcer Exam: There is no evidence of ulcer or pre-ulcerative changes or infection. Orthopedic Exam: Muscle tone and strength are WNL. No limitations in general ROM. No crepitus or effusions noted. Foot type and digits show no abnormalities. Bony prominences are unremarkable.Midfoot arthritis B/L.Skin: No Porokeratosis. No infection or ulcers  Diagnosis:  Onychomycosis, , Pain in right toe, pain in left toes  Treatment & Plan Procedures and Treatment: Consent by patient was obtained for treatment procedures. The patient understood the discussion of treatment and procedures well. All questions were answered thoroughly reviewed. Debridement of mycotic and hypertrophic toenails, 1 through 5 bilateral and clearing of subungual debris. No ulceration, no infection noted.  Return Visit-Office Procedure: Patient instructed to return to the office for a follow up visit 3 months for continued evaluation and treatment.  Reymond Maynez DPM 

## 2016-04-15 DIAGNOSIS — H40023 Open angle with borderline findings, high risk, bilateral: Secondary | ICD-10-CM | POA: Diagnosis not present

## 2016-04-15 DIAGNOSIS — H35363 Drusen (degenerative) of macula, bilateral: Secondary | ICD-10-CM | POA: Diagnosis not present

## 2016-04-15 DIAGNOSIS — H26491 Other secondary cataract, right eye: Secondary | ICD-10-CM | POA: Diagnosis not present

## 2016-04-15 DIAGNOSIS — Z961 Presence of intraocular lens: Secondary | ICD-10-CM | POA: Diagnosis not present

## 2016-06-18 DIAGNOSIS — D649 Anemia, unspecified: Secondary | ICD-10-CM | POA: Diagnosis not present

## 2016-06-18 DIAGNOSIS — I1310 Hypertensive heart and chronic kidney disease without heart failure, with stage 1 through stage 4 chronic kidney disease, or unspecified chronic kidney disease: Secondary | ICD-10-CM | POA: Diagnosis not present

## 2016-06-18 DIAGNOSIS — E559 Vitamin D deficiency, unspecified: Secondary | ICD-10-CM | POA: Diagnosis not present

## 2016-06-18 DIAGNOSIS — I361 Nonrheumatic tricuspid (valve) insufficiency: Secondary | ICD-10-CM | POA: Diagnosis not present

## 2016-06-18 DIAGNOSIS — M1990 Unspecified osteoarthritis, unspecified site: Secondary | ICD-10-CM | POA: Diagnosis not present

## 2016-06-18 DIAGNOSIS — N189 Chronic kidney disease, unspecified: Secondary | ICD-10-CM | POA: Diagnosis not present

## 2016-06-18 DIAGNOSIS — E785 Hyperlipidemia, unspecified: Secondary | ICD-10-CM | POA: Diagnosis not present

## 2016-06-23 ENCOUNTER — Ambulatory Visit: Payer: Medicare Other

## 2016-06-23 ENCOUNTER — Other Ambulatory Visit: Payer: Medicare Other

## 2016-06-23 ENCOUNTER — Ambulatory Visit: Payer: Medicare Other | Admitting: Family

## 2016-06-25 ENCOUNTER — Ambulatory Visit: Payer: Medicare Other | Admitting: Podiatry

## 2016-07-01 ENCOUNTER — Ambulatory Visit (INDEPENDENT_AMBULATORY_CARE_PROVIDER_SITE_OTHER): Payer: Medicare Other | Admitting: Podiatry

## 2016-07-01 DIAGNOSIS — B351 Tinea unguium: Secondary | ICD-10-CM | POA: Diagnosis not present

## 2016-07-01 DIAGNOSIS — M79673 Pain in unspecified foot: Secondary | ICD-10-CM

## 2016-07-01 NOTE — Progress Notes (Signed)
Patient ID: AYE WILKER, female   DOB: [birth year], 82 y.o.   MRN: [id number] Complaint:  Visit Type: Patient returns to my office for continued preventative foot care services. Complaint: Patient states" my nails have grown long and thick and become painful to walk and wear shoes" . The patient presents for preventative foot care services. No changes to ROS  Podiatric Exam: Vascular: dorsalis pedis and posterior tibial pulses are palpable bilateral. Capillary return is immediate. Temperature gradient is WNL. Skin turgor WNL  Sensorium: Normal Semmes Weinstein monofilament test. Normal tactile sensation bilaterally. Nail Exam: Pt has thick disfigured discolored nails with subungual debris noted bilateral entire nail hallux through fifth toenails Ulcer Exam: There is no evidence of ulcer or pre-ulcerative changes or infection. Orthopedic Exam: Muscle tone and strength are WNL. No limitations in general ROM. No crepitus or effusions noted. Foot type and digits show no abnormalities. Bony prominences are unremarkable.Midfoot arthritis B/L.Skin: No Porokeratosis. No infection or ulcers  Diagnosis:  Onychomycosis, , Pain in right toe, pain in left toes  Treatment & Plan Procedures and Treatment: Consent by patient was obtained for treatment procedures. The patient understood the discussion of treatment and procedures well. All questions were answered thoroughly reviewed. Debridement of mycotic and hypertrophic toenails, 1 through 5 bilateral and clearing of subungual debris. No ulceration, no infection noted.  Return Visit-Office Procedure: Patient instructed to return to the office for a follow up visit 3 months for continued evaluation and treatment.  [name] DPM

## 2016-07-08 ENCOUNTER — Encounter: Payer: Self-pay | Admitting: Family

## 2016-07-08 ENCOUNTER — Ambulatory Visit (HOSPITAL_BASED_OUTPATIENT_CLINIC_OR_DEPARTMENT_OTHER): Payer: Medicare Other | Admitting: Family

## 2016-07-08 ENCOUNTER — Other Ambulatory Visit (HOSPITAL_BASED_OUTPATIENT_CLINIC_OR_DEPARTMENT_OTHER): Payer: Medicare Other

## 2016-07-08 ENCOUNTER — Ambulatory Visit: Payer: Medicare Other

## 2016-07-08 VITALS — BP 164/74 | HR 84 | Temp 98.2°F | Resp 16 | Ht 60.0 in | Wt 163.0 lb

## 2016-07-08 DIAGNOSIS — N289 Disorder of kidney and ureter, unspecified: Secondary | ICD-10-CM | POA: Diagnosis not present

## 2016-07-08 DIAGNOSIS — D509 Iron deficiency anemia, unspecified: Secondary | ICD-10-CM | POA: Diagnosis not present

## 2016-07-08 DIAGNOSIS — D631 Anemia in chronic kidney disease: Secondary | ICD-10-CM

## 2016-07-08 DIAGNOSIS — D61818 Other pancytopenia: Secondary | ICD-10-CM | POA: Diagnosis not present

## 2016-07-08 DIAGNOSIS — N189 Chronic kidney disease, unspecified: Principal | ICD-10-CM

## 2016-07-08 LAB — COMPREHENSIVE METABOLIC PANEL (CC13)
A/G RATIO: 1.4 (ref 1.2–2.2)
ALT: 18 IU/L (ref 0–32)
AST: 16 IU/L (ref 0–40)
Albumin, Serum: 4.6 g/dL (ref 3.5–4.7)
Alkaline Phosphatase, S: 58 IU/L (ref 39–117)
BUN/Creatinine Ratio: 15 (ref 12–28)
BUN: 19 mg/dL (ref 8–27)
Bilirubin Total: 0.2 mg/dL (ref 0.0–1.2)
CALCIUM: 10 mg/dL (ref 8.7–10.3)
CHLORIDE: 101 mmol/L (ref 96–106)
Carbon Dioxide, Total: 29 mmol/L (ref 18–29)
Creatinine, Ser: 1.27 mg/dL — ABNORMAL HIGH (ref 0.57–1.00)
GFR calc Af Amer: 45 mL/min/{1.73_m2} — ABNORMAL LOW (ref >59)
GFR, EST NON AFRICAN AMERICAN: 39 mL/min/{1.73_m2} — AB (ref >59)
GLUCOSE: 117 mg/dL — AB (ref 65–99)
Globulin, Total: 3.2 g/dL (ref 1.5–4.5)
POTASSIUM: 3.4 mmol/L — AB (ref 3.5–5.2)
Sodium: 141 mmol/L (ref 134–144)
Total Protein: 7.8 g/dL (ref 6.0–8.5)

## 2016-07-08 LAB — CBC WITH DIFFERENTIAL (CANCER CENTER ONLY)
BASO#: 0 10*3/uL (ref 0.0–0.2)
BASO%: 0.4 % (ref 0.0–2.0)
EOS ABS: 0.1 10*3/uL (ref 0.0–0.5)
EOS%: 2.6 % (ref 0.0–7.0)
HCT: 34.5 % — ABNORMAL LOW (ref 34.8–46.6)
HEMOGLOBIN: 11.1 g/dL — AB (ref 11.6–15.9)
LYMPH#: 1.3 10*3/uL (ref 0.9–3.3)
LYMPH%: 25.9 % (ref 14.0–48.0)
MCH: 29.1 pg (ref 26.0–34.0)
MCHC: 32.2 g/dL (ref 32.0–36.0)
MCV: 91 fL (ref 81–101)
MONO#: 0.5 10*3/uL (ref 0.1–0.9)
MONO%: 10.8 % (ref 0.0–13.0)
NEUT%: 60.3 % (ref 39.6–80.0)
NEUTROS ABS: 3 10*3/uL (ref 1.5–6.5)
Platelets: 182 10*3/uL (ref 145–400)
RBC: 3.81 10*6/uL (ref 3.70–5.32)
RDW: 14.1 % (ref 11.1–15.7)
WBC: 5 10*3/uL (ref 3.9–10.0)

## 2016-07-08 LAB — CHCC SATELLITE - SMEAR

## 2016-07-08 NOTE — Progress Notes (Signed)
Hematology and Oncology Follow Up Visit  Brittany Weber [id number] 1932-01-02 82 y.o. 07/08/2016   Principle Diagnosis:  Anemia of renal insufficiency Intermittent iron deficiency anemia  Current Therapy:   Aranesp 300 mcg subcutaneous as needed for hemoglobin less than 11- last received in January 2017 IV iron as indicated - last received in 2014    Interim History:  Brittany Weber is here today for a follow-up. She is doing well but having problems with HTN. She states that she has had a headache with this and that her PCP is aware and has made adjustments to her antihypertensive medication regimen. I urged her to call and let them know that she is still hypertensive and having headaches. She states that she will "think about it."  No fever, chills, n/v, cough, rash, dizziness, vision changes, SOB, chest pain, palpitations, abdominal pain or changes in her bowel or bladder habits. No lymphadenopathy found on exam. No episodes of bleeding or bruising.  No numbness or tingling in her extremities. No swelling or tenderness in her extremities at this time. She has no new aches or pains. She is still wearing her back brace for support and uses her rolling walker to ambulate. She has had no recent fall and no syncopal episodes.  She has maintained an appetite and is staying well hydrated. Her weight is stable.   Medications:    Medication List       Accurate as of 07/08/16  5:07 PM. Always use your most recent med list.          amLODipine 10 MG tablet Commonly known as:  NORVASC Take 10 mg by mouth daily.   calcium carbonate 1250 (500 Ca) MG chewable tablet Commonly known as:  OS-CAL Chew 1 tablet by mouth daily. Pt takes Calcrate   cilostazol 100 MG tablet Commonly known as:  PLETAL Take 100 mg by mouth 2 (two) times daily.   dexamethasone 4 MG tablet Commonly known as:  DECADRON Take 4 mg by mouth as needed (for pain).   folic acid 1 MG tablet Commonly known as:   FOLVITE Take 1 tablet (1 mg total) by mouth 2 (two) times daily.   losartan 100 MG tablet Commonly known as:  COZAAR Take 100 mg by mouth daily.   NON FORMULARY Take by mouth 3 (three) times a week. Cleanse-more   PAPAYA ENZYMES PO Take by mouth 3 (three) times daily after meals.   prednisoLONE acetate 1 % ophthalmic suspension Commonly known as:  PRED FORTE   vitamin C 500 MG tablet Commonly known as:  ASCORBIC ACID Take 500 mg by mouth daily. Take 2 po   VITAMIN D-3 PO Take by mouth every morning.   VOLTAREN 1 % Gel Generic drug:  diclofenac sodium Apply topically 4 (four) times daily.   WHITE WILLOW BARK PO Take by mouth daily. " Pt states that white willow bark is natural aspirin "       Allergies:  Allergies  Allergen Reactions  . Amoxicillin   . Ampicillin   . Aspirin   . Clarithromycin   . Clarithromycin   . Codeine   . Darvocet [Propoxyphene N-Acetaminophen]   . Demerol   . Diazepam   . Erythromycin   . Indomethacin   . Iron     Pt states she is "allergic to iron pills"- no anaphylaxis to Iron. Tolerates Feraheme IV  . Latex   . Lisinopril   . Oxycodone   . Sulfa Antibiotics   .  Sulfonamide Derivatives   . Valium     Past Medical History, Surgical history, Social history, and Family History were reviewed and updated.  Review of Systems: All other 10 point review of systems is negative.   Physical Exam:  height is 5' (1.524 m) and weight is 163 lb (73.9 kg). Her oral temperature is 98.2 F (36.8 C). Her blood pressure is 164/74 (abnormal) and her pulse is 84. Her respiration is 16.   Wt Readings from Last 3 Encounters:  07/08/16 163 lb (73.9 kg)  01/01/16 167 lb (75.8 kg)  09/25/15 164 lb (74.4 kg)    Ocular: Sclerae unicteric, pupils equal, round and reactive to light Ear-nose-throat: Oropharynx clear, dentition fair Lymphatic: No cervical or supraclavicular adenopathy Lungs no rales or rhonchi, good excursion bilaterally Heart  regular rate and rhythm, no murmur appreciated Abd soft, nontender, positive bowel sounds, liver or spleen tip palpated on exam MSK no focal spinal tenderness, no joint edema Neuro: non-focal, well-oriented, appropriate affect Breasts: Deferred  Lab Results  Component Value Date   WBC 5.0 07/08/2016   HGB 11.1 (L) 07/08/2016   HCT 34.5 (L) 07/08/2016   MCV 91 07/08/2016   PLT 182 07/08/2016   Lab Results  Component Value Date   FERRITIN 1,158 (H) 03/13/2016   IRON 67 03/13/2016   TIBC 239 03/13/2016   UIBC 172 03/13/2016   IRONPCTSAT 28 03/13/2016   Lab Results  Component Value Date   RETICCTPCT 1.4 09/25/2015   RBC 3.81 07/08/2016   RETICCTABS 53.6 09/25/2015   No results found for: KPAFRELGTCHN, LAMBDASER, KAPLAMBRATIO No results found for: IGGSERUM, IGA, IGMSERUM No results found for: Marda Stalker, SPEI   Chemistry      Component Value Date/Time   NA 144 03/13/2016 1205   K 3.6 03/13/2016 1205   CL 105 07/11/2015 1212   CO2 24 03/13/2016 1205   BUN 18.8 03/13/2016 1205   CREATININE 1.4 (H) 03/13/2016 1205      Component Value Date/Time   CALCIUM 9.9 03/13/2016 1205   ALKPHOS 50 03/13/2016 1205   AST 18 03/13/2016 1205   ALT 16 03/13/2016 1205   BILITOT 0.31 03/13/2016 1205     Impression and Plan: Brittany Weber is an 82 year old African American female anemia of renal insufficiency and iron deficiency. So far, she has responded nicely to treatment and has not needed any intervention from our standpoint for several months.  Her Hgb today is 11.1 so she will not need Aranesp this visit.  We will see how her iron studies look and bring her in next week for an infusion if needed.  We will plan to see her back in 3 months for labs and follow-up.  She will contact us with any questions or concerns. We can certainly see her sooner if need be.   Verdie Mosher, NP 8/2/20175:07 PM

## 2016-07-09 ENCOUNTER — Telehealth: Payer: Self-pay | Admitting: *Deleted

## 2016-07-09 LAB — IRON AND TIBC
%SAT: 26 % (ref 21–57)
IRON: 63 ug/dL (ref 41–142)
TIBC: 245 ug/dL (ref 236–444)
UIBC: 182 ug/dL (ref 120–384)

## 2016-07-09 LAB — FERRITIN: Ferritin: 1184 ng/ml — ABNORMAL HIGH (ref 9–269)

## 2016-07-09 LAB — RETICULOCYTES: Reticulocyte Count: 0.9 % (ref 0.6–2.6)

## 2016-07-09 NOTE — Telephone Encounter (Signed)
-----   Message from [name], MD sent at 07/09/2016 12:33 PM EDT ----- Call - iron level is ok!! Brittany Weber

## 2016-09-10 ENCOUNTER — Ambulatory Visit (INDEPENDENT_AMBULATORY_CARE_PROVIDER_SITE_OTHER): Payer: Medicare Other | Admitting: Podiatry

## 2016-09-10 ENCOUNTER — Encounter: Payer: Self-pay | Admitting: Podiatry

## 2016-09-10 VITALS — BP 143/70 | HR 102 | Resp 14

## 2016-09-10 DIAGNOSIS — B351 Tinea unguium: Secondary | ICD-10-CM | POA: Diagnosis not present

## 2016-09-10 DIAGNOSIS — M79676 Pain in unspecified toe(s): Secondary | ICD-10-CM

## 2016-09-10 DIAGNOSIS — M79606 Pain in leg, unspecified: Secondary | ICD-10-CM

## 2016-09-10 NOTE — Progress Notes (Signed)
Patient ID: Brittany Weber, female   DOB: 07/22/1932, 82 y.o.   MRN: [id number] Complaint:  Visit Type: Patient returns to my office for continued preventative foot care services. Complaint: Patient states" my nails have grown long and thick and become painful to walk and wear shoes" . The patient presents for preventative foot care services. No changes to ROS  Podiatric Exam: Vascular: dorsalis pedis and posterior tibial pulses are palpable bilateral. Capillary return is immediate. Temperature gradient is WNL. Skin turgor WNL  Sensorium: Normal Semmes Weinstein monofilament test. Normal tactile sensation bilaterally. Nail Exam: Pt has thick disfigured discolored nails with subungual debris noted bilateral entire nail hallux through fifth toenails Ulcer Exam: There is no evidence of ulcer or pre-ulcerative changes or infection. Orthopedic Exam: Muscle tone and strength are WNL. No limitations in general ROM. No crepitus or effusions noted. Foot type and digits show no abnormalities. Bony prominences are unremarkable.Midfoot arthritis B/L.Skin: No Porokeratosis. No infection or ulcers  Diagnosis:  Onychomycosis, , Pain in right toe, pain in left toes  Treatment & Plan Procedures and Treatment: Consent by patient was obtained for treatment procedures. The patient understood the discussion of treatment and procedures well. All questions were answered thoroughly reviewed. Debridement of mycotic and hypertrophic toenails, 1 through 5 bilateral and clearing of subungual debris. No ulceration, no infection noted.  Return Visit-Office Procedure: Patient instructed to return to the office for a follow up visit 3 months for continued evaluation and treatment.  Loralie Malta DPM 

## 2016-09-23 DIAGNOSIS — E785 Hyperlipidemia, unspecified: Secondary | ICD-10-CM | POA: Diagnosis not present

## 2016-09-23 DIAGNOSIS — D649 Anemia, unspecified: Secondary | ICD-10-CM | POA: Diagnosis not present

## 2016-09-23 DIAGNOSIS — E559 Vitamin D deficiency, unspecified: Secondary | ICD-10-CM | POA: Diagnosis not present

## 2016-09-23 DIAGNOSIS — I1310 Hypertensive heart and chronic kidney disease without heart failure, with stage 1 through stage 4 chronic kidney disease, or unspecified chronic kidney disease: Secondary | ICD-10-CM | POA: Diagnosis not present

## 2016-09-23 DIAGNOSIS — N189 Chronic kidney disease, unspecified: Secondary | ICD-10-CM | POA: Diagnosis not present

## 2016-09-23 DIAGNOSIS — I361 Nonrheumatic tricuspid (valve) insufficiency: Secondary | ICD-10-CM | POA: Diagnosis not present

## 2016-10-01 DIAGNOSIS — Z1231 Encounter for screening mammogram for malignant neoplasm of breast: Secondary | ICD-10-CM | POA: Diagnosis not present

## 2016-10-06 ENCOUNTER — Encounter: Payer: Self-pay | Admitting: Hematology & Oncology

## 2016-10-09 ENCOUNTER — Ambulatory Visit: Payer: Medicare Other | Admitting: Hematology & Oncology

## 2016-10-09 ENCOUNTER — Ambulatory Visit: Payer: Medicare Other

## 2016-10-09 ENCOUNTER — Other Ambulatory Visit: Payer: Medicare Other

## 2016-10-15 ENCOUNTER — Other Ambulatory Visit (HOSPITAL_BASED_OUTPATIENT_CLINIC_OR_DEPARTMENT_OTHER): Payer: Medicare Other

## 2016-10-15 ENCOUNTER — Ambulatory Visit: Payer: Medicare Other

## 2016-10-15 ENCOUNTER — Ambulatory Visit (HOSPITAL_BASED_OUTPATIENT_CLINIC_OR_DEPARTMENT_OTHER): Payer: Medicare Other | Admitting: Hematology & Oncology

## 2016-10-15 VITALS — BP 145/62 | HR 77 | Temp 98.4°F | Resp 20 | Wt 162.8 lb

## 2016-10-15 DIAGNOSIS — N189 Chronic kidney disease, unspecified: Principal | ICD-10-CM

## 2016-10-15 DIAGNOSIS — D631 Anemia in chronic kidney disease: Secondary | ICD-10-CM | POA: Diagnosis not present

## 2016-10-15 DIAGNOSIS — D509 Iron deficiency anemia, unspecified: Secondary | ICD-10-CM | POA: Diagnosis not present

## 2016-10-15 DIAGNOSIS — D61818 Other pancytopenia: Secondary | ICD-10-CM

## 2016-10-15 LAB — CBC WITH DIFFERENTIAL (CANCER CENTER ONLY)
BASO#: 0 10*3/uL (ref 0.0–0.2)
BASO%: 0.7 % (ref 0.0–2.0)
EOS%: 3.3 % (ref 0.0–7.0)
Eosinophils Absolute: 0.1 10*3/uL (ref 0.0–0.5)
HEMATOCRIT: 35.1 % (ref 34.8–46.6)
HEMOGLOBIN: 11.3 g/dL — AB (ref 11.6–15.9)
LYMPH#: 1.6 10*3/uL (ref 0.9–3.3)
LYMPH%: 37.6 % (ref 14.0–48.0)
MCH: 28.5 pg (ref 26.0–34.0)
MCHC: 32.2 g/dL (ref 32.0–36.0)
MCV: 89 fL (ref 81–101)
MONO#: 0.5 10*3/uL (ref 0.1–0.9)
MONO%: 12.3 % (ref 0.0–13.0)
NEUT%: 46.1 % (ref 39.6–80.0)
NEUTROS ABS: 2 10*3/uL (ref 1.5–6.5)
Platelets: 176 10*3/uL (ref 145–400)
RBC: 3.96 10*6/uL (ref 3.70–5.32)
RDW: 14.6 % (ref 11.1–15.7)
WBC: 4.2 10*3/uL (ref 3.9–10.0)

## 2016-10-15 LAB — RETICULOCYTES: Reticulocyte Count: 1.1 % (ref 0.6–2.6)

## 2016-10-15 NOTE — Progress Notes (Signed)
Hematology and Oncology Follow Up Visit  Brittany Weber [id number] 01-10-1932 82 y.o. 10/15/2016   Principle Diagnosis:   Anemia of renal insufficiency  Intermittent iron deficiency anemia  Current Therapy:    Aranesp 300 mcg subcutaneous as needed for hemoglobin less than 11 - dose given on 01/01/2016  IV iron as indicated     Interim History:  Ms.  Brittany Weber is back for followup. She's doing okay. She had a pretty decent and quiet summer season.   She still had a lot of problems with her back and hips however, she still does not want to have any surgery. She feels that this actually is getting a little bit better.  She is looking forward to the holidays. It does not sound like there will be a lot of family members who will come over.  Her last ferritin was 1184. Her iron saturation was 26%. This was in August. A lot of the ferritin elevation is from her severe arthritis.  She has had no change in bowel or bladder habits. She's had no leg swelling. She's had no rashes.  She has not had any fever. There's been no infections.  Her appetite is doing okay. She's had no nausea or vomiting. Overall, her performance status is ECOG 2-3  Medications:  Current Outpatient Prescriptions:  .  amLODipine (NORVASC) 10 MG tablet, Take 10 mg by mouth daily., Disp: , Rfl:  .  amLODipine-olmesartan (AZOR) 10-40 MG tablet, , Disp: , Rfl:  .  calcium carbonate (OS-CAL) 1250 MG chewable tablet, Chew 1 tablet by mouth daily. Pt takes Calcrate, Disp: , Rfl:  .  Cholecalciferol (VITAMIN D-3 PO), Take by mouth every morning., Disp: , Rfl:  .  cilostazol (PLETAL) 100 MG tablet, Take 100 mg by mouth 2 (two) times daily. , Disp: , Rfl:  .  dexamethasone (DECADRON) 4 MG tablet, Take 4 mg by mouth as needed (for pain)., Disp: , Rfl:  .  diclofenac sodium (VOLTAREN) 1 % GEL, Apply topically 4 (four) times daily., Disp: , Rfl:  .  folic acid (FOLVITE) 1 MG tablet, Take 1 tablet (1 mg total) by mouth 2 (two)  times daily., Disp: 60 tablet, Rfl: 11 .  losartan (COZAAR) 100 MG tablet, Take 100 mg by mouth daily., Disp: , Rfl:  .  meloxicam (MOBIC) 15 MG tablet, , Disp: , Rfl:  .  Misc Natural Products (WHITE WILLOW BARK PO), Take by mouth daily. " Pt states that white willow bark is natural aspirin ", Disp: , Rfl:  .  NON FORMULARY, Take by mouth 3 (three) times a week. Cleanse-more, Disp: , Rfl:  .  PAPAYA ENZYMES PO, Take by mouth 3 (three) times daily after meals., Disp: , Rfl:  .  prednisoLONE acetate (PRED FORTE) 1 % ophthalmic suspension, , Disp: , Rfl:  .  vitamin C (ASCORBIC ACID) 500 MG tablet, Take 500 mg by mouth daily. Take 2 po, Disp: , Rfl:   Allergies:  Allergies  Allergen Reactions  . Amoxicillin   . Ampicillin   . Aspirin   . Clarithromycin   . Clarithromycin   . Codeine   . Darvocet [Propoxyphene N-Acetaminophen]   . Demerol   . Diazepam   . Erythromycin   . Indomethacin   . Iron     Pt states she is "allergic to iron pills"- no anaphylaxis to Iron. Tolerates Feraheme IV  . Latex   . Lisinopril   . Oxycodone   . Sulfa Antibiotics   .  Sulfonamide Derivatives   . Valium     Past Medical History, Surgical history, Social history, and Family History were reviewed and updated.  Review of Systems: As above  Physical Exam:  weight is 162 lb 12.8 oz (73.8 kg). Her oral temperature is 98.4 F (36.9 C). Her blood pressure is 145/62 (abnormal) and her pulse is 77. Her respiration is 20.   Somewhat elderly African American female. Her head and neck exam shows no ocular or oral lesions. She has no palpable thyroid. There is no adenopathy in the cervical or supraclavicular regions. Lungs are clear. Cardiac exam regular rate and rhythm with no murmurs, rubs or bruits. Abdomen is soft. She has decent bowel sounds. There is no fluid wave. There is no palpable liver or spleen. Back exam no tenderness over the spine, ribs or hips. Extremities shows a brace on the right leg. She has  mild edema in her lower legs. Skin exam no rashes, ecchymosis or petechia. Neurological exam shows no focal neurological deficits.  Lab Results  Component Value Date   WBC 4.2 10/15/2016   HGB 11.3 (L) 10/15/2016   HCT 35.1 10/15/2016   MCV 89 10/15/2016   PLT 176 10/15/2016     Chemistry      Component Value Date/Time   NA 141 07/08/2016 1417   NA 144 03/13/2016 1205      Component Value Date/Time   CALCIUM 10.0 07/08/2016 1417   CALCIUM 9.9 03/13/2016 1205         Impression and Plan: [name] is an 82 year old African-American female. She has multifactorial anemia. Her hemoglobin is holding relatively steady. She will not  need Aranesp today.  I do not think that she would need any iron today.  I think we probably get her back in 4 months. I want to try to get her through the wintertime. It is hard for her to come out to the office. I want to try to minimize her being exposed to any nasty weather.  Josph MachoENNEVER,Brittany Vanderzee R, MD 11/9/20171:25 PM

## 2016-10-16 LAB — IRON AND TIBC
%SAT: 33 % (ref 21–57)
IRON: 82 ug/dL (ref 41–142)
TIBC: 249 ug/dL (ref 236–444)
UIBC: 167 ug/dL (ref 120–384)

## 2016-10-16 LAB — FERRITIN

## 2016-10-22 DIAGNOSIS — H40023 Open angle with borderline findings, high risk, bilateral: Secondary | ICD-10-CM | POA: Diagnosis not present

## 2016-10-22 DIAGNOSIS — H04123 Dry eye syndrome of bilateral lacrimal glands: Secondary | ICD-10-CM | POA: Diagnosis not present

## 2016-11-06 DIAGNOSIS — M545 Low back pain: Secondary | ICD-10-CM | POA: Diagnosis not present

## 2016-11-06 DIAGNOSIS — N390 Urinary tract infection, site not specified: Secondary | ICD-10-CM | POA: Diagnosis not present

## 2016-11-06 DIAGNOSIS — Z79899 Other long term (current) drug therapy: Secondary | ICD-10-CM | POA: Diagnosis not present

## 2016-11-06 DIAGNOSIS — M1990 Unspecified osteoarthritis, unspecified site: Secondary | ICD-10-CM | POA: Diagnosis not present

## 2016-11-06 DIAGNOSIS — E559 Vitamin D deficiency, unspecified: Secondary | ICD-10-CM | POA: Diagnosis not present

## 2016-11-06 DIAGNOSIS — Z0000 Encounter for general adult medical examination without abnormal findings: Secondary | ICD-10-CM | POA: Diagnosis not present

## 2016-11-06 DIAGNOSIS — Z13228 Encounter for screening for other metabolic disorders: Secondary | ICD-10-CM | POA: Diagnosis not present

## 2016-11-06 DIAGNOSIS — M25521 Pain in right elbow: Secondary | ICD-10-CM | POA: Diagnosis not present

## 2016-11-09 ENCOUNTER — Other Ambulatory Visit: Payer: Self-pay | Admitting: Hematology & Oncology

## 2016-11-09 DIAGNOSIS — D529 Folate deficiency anemia, unspecified: Secondary | ICD-10-CM

## 2016-11-19 ENCOUNTER — Encounter: Payer: Self-pay | Admitting: Podiatry

## 2016-11-19 ENCOUNTER — Ambulatory Visit (INDEPENDENT_AMBULATORY_CARE_PROVIDER_SITE_OTHER): Payer: Medicare Other | Admitting: Podiatry

## 2016-11-19 VITALS — Ht 60.0 in | Wt 162.0 lb

## 2016-11-19 DIAGNOSIS — B351 Tinea unguium: Secondary | ICD-10-CM

## 2016-11-19 DIAGNOSIS — M79606 Pain in leg, unspecified: Secondary | ICD-10-CM

## 2016-11-19 DIAGNOSIS — M79673 Pain in unspecified foot: Secondary | ICD-10-CM | POA: Diagnosis not present

## 2016-11-19 NOTE — Progress Notes (Signed)
Patient ID: Brittany Weber, female   DOB: 1932/04/04, 82 y.o.   MRN: [id number] Complaint:  Visit Type: Patient returns to my office for continued preventative foot care services. Complaint: Patient states" my nails have grown long and thick and become painful to walk and wear shoes" . The patient presents for preventative foot care services. No changes to ROS  Podiatric Exam: Vascular: dorsalis pedis and posterior tibial pulses are palpable bilateral. Capillary return is immediate. Temperature gradient is WNL. Skin turgor WNL  Sensorium: Normal Semmes Weinstein monofilament test. Normal tactile sensation bilaterally. Nail Exam: Pt has thick disfigured discolored nails with subungual debris noted bilateral entire nail hallux through fifth toenails Ulcer Exam: There is no evidence of ulcer or pre-ulcerative changes or infection. Orthopedic Exam: Muscle tone and strength are WNL. No limitations in general ROM. No crepitus or effusions noted. Foot type and digits show no abnormalities. Bony prominences are unremarkable.Midfoot arthritis B/L.Skin: No Porokeratosis. No infection or ulcers  Diagnosis:  Onychomycosis, , Pain in right toe, pain in left toes  Treatment & Plan Procedures and Treatment: Consent by patient was obtained for treatment procedures. The patient understood the discussion of treatment and procedures well. All questions were answered thoroughly reviewed. Debridement of mycotic and hypertrophic toenails, 1 through 5 bilateral and clearing of subungual debris. No ulceration, no infection noted.  Return Visit-Office Procedure: Patient instructed to return to the office for a follow up visit 3 months for continued evaluation and treatment.  Helane GuntherGregory Javelle Donigan DPM

## 2017-01-04 ENCOUNTER — Other Ambulatory Visit: Payer: Self-pay | Admitting: Hematology & Oncology

## 2017-01-04 DIAGNOSIS — D529 Folate deficiency anemia, unspecified: Secondary | ICD-10-CM

## 2017-01-19 DIAGNOSIS — I10 Essential (primary) hypertension: Secondary | ICD-10-CM | POA: Diagnosis not present

## 2017-01-19 DIAGNOSIS — E785 Hyperlipidemia, unspecified: Secondary | ICD-10-CM | POA: Diagnosis not present

## 2017-01-19 DIAGNOSIS — E559 Vitamin D deficiency, unspecified: Secondary | ICD-10-CM | POA: Diagnosis not present

## 2017-01-20 DIAGNOSIS — N181 Chronic kidney disease, stage 1: Secondary | ICD-10-CM | POA: Diagnosis not present

## 2017-01-20 DIAGNOSIS — E785 Hyperlipidemia, unspecified: Secondary | ICD-10-CM | POA: Diagnosis not present

## 2017-01-20 DIAGNOSIS — M1990 Unspecified osteoarthritis, unspecified site: Secondary | ICD-10-CM | POA: Diagnosis not present

## 2017-01-20 DIAGNOSIS — I1310 Hypertensive heart and chronic kidney disease without heart failure, with stage 1 through stage 4 chronic kidney disease, or unspecified chronic kidney disease: Secondary | ICD-10-CM | POA: Diagnosis not present

## 2017-01-20 DIAGNOSIS — E559 Vitamin D deficiency, unspecified: Secondary | ICD-10-CM | POA: Diagnosis not present

## 2017-01-20 DIAGNOSIS — D649 Anemia, unspecified: Secondary | ICD-10-CM | POA: Diagnosis not present

## 2017-01-27 ENCOUNTER — Ambulatory Visit (INDEPENDENT_AMBULATORY_CARE_PROVIDER_SITE_OTHER): Payer: Medicare Other | Admitting: Podiatry

## 2017-01-27 ENCOUNTER — Encounter: Payer: Self-pay | Admitting: Podiatry

## 2017-01-27 VITALS — Ht 60.0 in | Wt 162.0 lb

## 2017-01-27 DIAGNOSIS — M2010 Hallux valgus (acquired), unspecified foot: Secondary | ICD-10-CM

## 2017-01-27 DIAGNOSIS — B351 Tinea unguium: Secondary | ICD-10-CM

## 2017-01-27 NOTE — Progress Notes (Signed)
Patient ID: Brittany Weber, female   DOB: 06/15/1932, 84 y.o.   MRN: [id number] Complaint:  Visit Type: Patient returns to my office for continued preventative foot care services. Complaint: Patient states" my nails have grown long and thick and become painful to walk and wear shoes" . The patient presents for preventative foot care services. No changes to ROS  Podiatric Exam: Vascular: dorsalis pedis and posterior tibial pulses are palpable bilateral. Capillary return is immediate. Temperature gradient is WNL. Skin turgor WNL  Sensorium: Normal Semmes Weinstein monofilament test. Normal tactile sensation bilaterally. Nail Exam: Pt has thick disfigured discolored nails with subungual debris noted bilateral entire nail hallux through fifth toenails Ulcer Exam: There is no evidence of ulcer or pre-ulcerative changes or infection. Orthopedic Exam: Muscle tone and strength are WNL. No limitations in general ROM. No crepitus or effusions noted. Foot type and digits show no abnormalities. Bony prominences are unremarkable.Midfoot arthritis B/L.Skin: No Porokeratosis. No infection or ulcers  Diagnosis:  Onychomycosis, , Pain in right toe, pain in left toes  Treatment & Plan Procedures and Treatment: Consent by patient was obtained for treatment procedures. The patient understood the discussion of treatment and procedures well. All questions were answered thoroughly reviewed. Debridement of mycotic and hypertrophic toenails, 1 through 5 bilateral and clearing of subungual debris. No ulceration, no infection noted.  Return Visit-Office Procedure: Patient instructed to return to the office for a follow up visit 3 months for continued evaluation and treatment.  Garreth Burnsworth DPM 

## 2017-01-28 ENCOUNTER — Ambulatory Visit: Payer: Medicare Other | Admitting: Podiatry

## 2017-02-12 ENCOUNTER — Other Ambulatory Visit: Payer: Medicare Other

## 2017-02-12 ENCOUNTER — Ambulatory Visit: Payer: Medicare Other | Admitting: Hematology & Oncology

## 2017-02-12 ENCOUNTER — Ambulatory Visit: Payer: Medicare Other

## 2017-02-16 ENCOUNTER — Other Ambulatory Visit: Payer: Self-pay | Admitting: Hematology & Oncology

## 2017-02-16 DIAGNOSIS — D529 Folate deficiency anemia, unspecified: Secondary | ICD-10-CM

## 2017-02-23 ENCOUNTER — Ambulatory Visit: Payer: Medicare Other | Admitting: Hematology & Oncology

## 2017-02-23 ENCOUNTER — Ambulatory Visit: Payer: Medicare Other

## 2017-02-23 ENCOUNTER — Other Ambulatory Visit: Payer: Medicare Other

## 2017-03-02 ENCOUNTER — Ambulatory Visit: Payer: Medicare Other

## 2017-03-02 ENCOUNTER — Other Ambulatory Visit: Payer: Medicare Other

## 2017-03-02 ENCOUNTER — Ambulatory Visit: Payer: Medicare Other | Admitting: Hematology & Oncology

## 2017-03-08 ENCOUNTER — Ambulatory Visit: Payer: Medicare Other

## 2017-03-08 ENCOUNTER — Ambulatory Visit (HOSPITAL_BASED_OUTPATIENT_CLINIC_OR_DEPARTMENT_OTHER): Payer: Medicare Other | Admitting: Hematology & Oncology

## 2017-03-08 ENCOUNTER — Other Ambulatory Visit (HOSPITAL_BASED_OUTPATIENT_CLINIC_OR_DEPARTMENT_OTHER): Payer: Medicare Other

## 2017-03-08 VITALS — BP 147/62 | HR 72 | Temp 98.7°F | Resp 16 | Wt 163.0 lb

## 2017-03-08 DIAGNOSIS — D631 Anemia in chronic kidney disease: Secondary | ICD-10-CM | POA: Diagnosis not present

## 2017-03-08 DIAGNOSIS — D508 Other iron deficiency anemias: Secondary | ICD-10-CM

## 2017-03-08 DIAGNOSIS — D61818 Other pancytopenia: Secondary | ICD-10-CM

## 2017-03-08 DIAGNOSIS — N189 Chronic kidney disease, unspecified: Secondary | ICD-10-CM

## 2017-03-08 DIAGNOSIS — D509 Iron deficiency anemia, unspecified: Secondary | ICD-10-CM

## 2017-03-08 LAB — CMP (CANCER CENTER ONLY)
ALBUMIN: 4 g/dL (ref 3.3–5.5)
ALT(SGPT): 22 U/L (ref 10–47)
AST: 23 U/L (ref 11–38)
Alkaline Phosphatase: 54 U/L (ref 26–84)
BUN, Bld: 17 mg/dL (ref 7–22)
CHLORIDE: 105 meq/L (ref 98–108)
CO2: 29 meq/L (ref 18–33)
CREATININE: 1.3 mg/dL — AB (ref 0.6–1.2)
Calcium: 10.3 mg/dL (ref 8.0–10.3)
Glucose, Bld: 94 mg/dL (ref 73–118)
Potassium: 3.8 mEq/L (ref 3.3–4.7)
SODIUM: 145 meq/L (ref 128–145)
Total Bilirubin: 0.5 mg/dl (ref 0.20–1.60)
Total Protein: 8.2 g/dL — ABNORMAL HIGH (ref 6.4–8.1)

## 2017-03-08 LAB — CBC WITH DIFFERENTIAL (CANCER CENTER ONLY)
BASO#: 0 10*3/uL (ref 0.0–0.2)
BASO%: 0.7 % (ref 0.0–2.0)
EOS ABS: 0.2 10*3/uL (ref 0.0–0.5)
EOS%: 4.2 % (ref 0.0–7.0)
HCT: 35.4 % (ref 34.8–46.6)
HEMOGLOBIN: 11.2 g/dL — AB (ref 11.6–15.9)
LYMPH#: 1.4 10*3/uL (ref 0.9–3.3)
LYMPH%: 30.9 % (ref 14.0–48.0)
MCH: 28.7 pg (ref 26.0–34.0)
MCHC: 31.6 g/dL — ABNORMAL LOW (ref 32.0–36.0)
MCV: 91 fL (ref 81–101)
MONO#: 0.5 10*3/uL (ref 0.1–0.9)
MONO%: 11.6 % (ref 0.0–13.0)
NEUT#: 2.4 10*3/uL (ref 1.5–6.5)
NEUT%: 52.6 % (ref 39.6–80.0)
PLATELETS: 200 10*3/uL (ref 145–400)
RBC: 3.9 10*6/uL (ref 3.70–5.32)
RDW: 14.2 % (ref 11.1–15.7)
WBC: 4.5 10*3/uL (ref 3.9–10.0)

## 2017-03-08 LAB — IRON AND TIBC
%SAT: 28 % (ref 21–57)
IRON: 71 ug/dL (ref 41–142)
TIBC: 251 ug/dL (ref 236–444)
UIBC: 180 ug/dL (ref 120–384)

## 2017-03-08 LAB — RETICULOCYTES: RETICULOCYTE COUNT: 1.2 % (ref 0.6–2.6)

## 2017-03-08 LAB — FERRITIN: Ferritin: 1103 ng/ml — ABNORMAL HIGH (ref 9–269)

## 2017-03-08 NOTE — Progress Notes (Signed)
Hematology and Oncology Follow Up Visit  Brittany Weber [id number] 1932-09-27 83 y.o. 03/08/2017   Principle Diagnosis:   Anemia of renal insufficiency  Intermittent iron deficiency anemia  Current Therapy:    Aranesp 300 mcg subcutaneous as needed for hemoglobin less than 11 - dose given on 01/01/2016  IV iron as indicated     Interim History:  Ms.  Brittany Weber is back for followup. She's doing okay. She had a pretty decent and quiet summer season.   She still had a lot of problems with her back and hips however, she still does not want to have any surgery. She feels that this actually is getting a little bit better.  She had a very quiet Easter. Because of her bad arthritis, she really cannot go anywhere. She really cannot go to church.   Her last ferritin was 1189. Her iron saturation was 33%. This was in November. A lot of the ferritin elevation is from her severe arthritis.  She has had no change in bowel or bladder habits. She's had no leg swelling. She's had no rashes.  She has not had any fever. There's been no infections.  Her appetite is doing okay. She's had no nausea or vomiting. Overall, her performance status is ECOG 3.  Medications:  Current Outpatient Prescriptions:  .  amLODipine-olmesartan (AZOR) 10-40 MG tablet, , Disp: , Rfl:  .  calcium carbonate (OS-CAL) 1250 MG chewable tablet, Chew 1 tablet by mouth daily. Pt takes Calcrate, Disp: , Rfl:  .  Cholecalciferol (VITAMIN D-3 PO), Take by mouth every morning., Disp: , Rfl:  .  cilostazol (PLETAL) 100 MG tablet, Take 100 mg by mouth 2 (two) times daily. , Disp: , Rfl:  .  dexamethasone (DECADRON) 4 MG tablet, Take 4 mg by mouth as needed (for pain)., Disp: , Rfl:  .  diclofenac sodium (VOLTAREN) 1 % GEL, Apply topically 4 (four) times daily., Disp: , Rfl:  .  folic acid (FOLVITE) 1 MG tablet, TAKE 1 TABLET TWICE DAILY., Disp: 60 tablet, Rfl: 0 .  losartan (COZAAR) 100 MG tablet, Take 100 mg by mouth daily.,  Disp: , Rfl:  .  Misc Natural Products (WHITE WILLOW BARK PO), Take by mouth daily. " Pt states that white willow bark is natural aspirin ", Disp: , Rfl:  .  NON FORMULARY, Take by mouth 3 (three) times a week. Cleanse-more, Disp: , Rfl:  .  PAPAYA ENZYMES PO, Take by mouth 3 (three) times daily after meals., Disp: , Rfl:  .  vitamin C (ASCORBIC ACID) 500 MG tablet, Take 500 mg by mouth daily. Take 2 po, Disp: , Rfl:   Allergies:  Allergies  Allergen Reactions  . Amoxicillin   . Ampicillin   . Aspirin   . Clarithromycin   . Clarithromycin   . Codeine   . Darvocet [Propoxyphene N-Acetaminophen]   . Demerol   . Diazepam   . Erythromycin   . Indomethacin   . Iron     Pt states she is "allergic to iron pills"- no anaphylaxis to Iron. Tolerates Feraheme IV  . Latex   . Lisinopril   . Oxycodone   . Sulfa Antibiotics   . Sulfonamide Derivatives   . Valium     Past Medical History, Surgical history, Social history, and Family History were reviewed and updated.  Review of Systems: As above  Physical Exam:  weight is 163 lb (73.9 kg). Her oral temperature is 98.7 F (37.1 C). Her blood pressure is  147/62 (abnormal) and her pulse is 72. Her respiration is 16 and oxygen saturation is 99%.   Somewhat elderly African American female. Her head and neck exam shows no ocular or oral lesions. She has no palpable thyroid. There is no adenopathy in the cervical or supraclavicular regions. Lungs are clear. Cardiac exam regular rate and rhythm with no murmurs, rubs or bruits. Abdomen is soft. She has decent bowel sounds. There is no fluid wave. There is no palpable liver or spleen. Back exam no tenderness over the spine, ribs or hips. Extremities shows a brace on the right leg. She has mild edema in her lower legs. Skin exam no rashes, ecchymosis or petechia. Neurological exam shows no focal neurological deficits.  Lab Results  Component Value Date   WBC 4.5 03/08/2017   HGB 11.2 (L)  03/08/2017   HCT 35.4 03/08/2017   MCV 91 03/08/2017   PLT 200 03/08/2017     Chemistry      Component Value Date/Time   NA 145 03/08/2017 1003   NA 144 03/13/2016 1205      Component Value Date/Time   CALCIUM 10.3 03/08/2017 1003   CALCIUM 9.9 03/13/2016 1205         Impression and Plan: [name] is an 83 year old African-American female. She has multifactorial anemia. Her hemoglobin is holding relatively steady. She will not  need Aranesp today.  I do not think that she would need any iron today.  I think we probably get her back in 6 months. I want to try to get her through the summer. It is hard for her to come out to the office. I want to try to minimize her being exposed to any hot, humid weather. I think this would make her arthritis a lot worse.  [name], MD 4/2/201811:29 AM

## 2017-03-09 LAB — ERYTHROPOIETIN: Erythropoietin: 20.1 m[IU]/mL — ABNORMAL HIGH (ref 2.6–18.5)

## 2017-03-25 ENCOUNTER — Other Ambulatory Visit: Payer: Self-pay | Admitting: Hematology & Oncology

## 2017-03-25 DIAGNOSIS — D529 Folate deficiency anemia, unspecified: Secondary | ICD-10-CM

## 2017-04-21 ENCOUNTER — Other Ambulatory Visit: Payer: Self-pay | Admitting: Cardiology

## 2017-04-21 DIAGNOSIS — E785 Hyperlipidemia, unspecified: Secondary | ICD-10-CM | POA: Diagnosis not present

## 2017-04-21 DIAGNOSIS — N189 Chronic kidney disease, unspecified: Secondary | ICD-10-CM | POA: Diagnosis not present

## 2017-04-21 DIAGNOSIS — I1310 Hypertensive heart and chronic kidney disease without heart failure, with stage 1 through stage 4 chronic kidney disease, or unspecified chronic kidney disease: Secondary | ICD-10-CM | POA: Diagnosis not present

## 2017-04-21 DIAGNOSIS — E559 Vitamin D deficiency, unspecified: Secondary | ICD-10-CM | POA: Diagnosis not present

## 2017-04-21 DIAGNOSIS — M1990 Unspecified osteoarthritis, unspecified site: Secondary | ICD-10-CM | POA: Diagnosis not present

## 2017-04-21 DIAGNOSIS — R0609 Other forms of dyspnea: Secondary | ICD-10-CM | POA: Diagnosis not present

## 2017-04-21 DIAGNOSIS — D649 Anemia, unspecified: Secondary | ICD-10-CM | POA: Diagnosis not present

## 2017-04-21 DIAGNOSIS — I25119 Atherosclerotic heart disease of native coronary artery with unspecified angina pectoris: Secondary | ICD-10-CM | POA: Diagnosis not present

## 2017-04-21 DIAGNOSIS — R079 Chest pain, unspecified: Secondary | ICD-10-CM

## 2017-04-28 ENCOUNTER — Ambulatory Visit: Payer: Medicare Other | Admitting: Podiatry

## 2017-04-29 ENCOUNTER — Encounter: Payer: Self-pay | Admitting: Podiatry

## 2017-04-29 ENCOUNTER — Ambulatory Visit (INDEPENDENT_AMBULATORY_CARE_PROVIDER_SITE_OTHER): Payer: Medicare Other | Admitting: Podiatry

## 2017-04-29 DIAGNOSIS — B351 Tinea unguium: Secondary | ICD-10-CM | POA: Diagnosis not present

## 2017-04-29 DIAGNOSIS — M79676 Pain in unspecified toe(s): Secondary | ICD-10-CM | POA: Diagnosis not present

## 2017-04-29 NOTE — Progress Notes (Signed)
Patient ID: Brittany Weber, female   DOB: 1932-08-02, 83 y.o.   MRN: [id number] Complaint:  Visit Type: Patient returns to my office for continued preventative foot care services. Complaint: Patient states" my nails have grown long and thick and become painful to walk and wear shoes" . The patient presents for preventative foot care services. No changes to ROS  Podiatric Exam: Vascular: dorsalis pedis and posterior tibial pulses are palpable bilateral. Capillary return is immediate. Temperature gradient is WNL. Skin turgor WNL  Sensorium: Normal Semmes Weinstein monofilament test. Normal tactile sensation bilaterally. Nail Exam: Pt has thick disfigured discolored nails with subungual debris noted bilateral entire nail hallux through fifth toenails Ulcer Exam: There is no evidence of ulcer or pre-ulcerative changes or infection. Orthopedic Exam: Muscle tone and strength are WNL. No limitations in general ROM. No crepitus or effusions noted. Foot type and digits show no abnormalities. Bony prominences are unremarkable.Midfoot arthritis B/L.Skin: No Porokeratosis. No infection or ulcers  Diagnosis:  Onychomycosis, , Pain in right toe, pain in left toes  Treatment & Plan Procedures and Treatment: Consent by patient was obtained for treatment procedures. The patient understood the discussion of treatment and procedures well. All questions were answered thoroughly reviewed. Debridement of mycotic and hypertrophic toenails, 1 through 5 bilateral and clearing of subungual debris. No ulceration, no infection noted.  Return Visit-Office Procedure: Patient instructed to return to the office for a follow up visit 10 weeks  for continued evaluation and treatment.  Helane GuntherGregory Kynlie Jane DPM

## 2017-05-12 ENCOUNTER — Ambulatory Visit (HOSPITAL_COMMUNITY): Payer: Medicare Other

## 2017-05-13 DIAGNOSIS — Z961 Presence of intraocular lens: Secondary | ICD-10-CM | POA: Diagnosis not present

## 2017-05-13 DIAGNOSIS — H26491 Other secondary cataract, right eye: Secondary | ICD-10-CM | POA: Diagnosis not present

## 2017-05-13 DIAGNOSIS — H35363 Drusen (degenerative) of macula, bilateral: Secondary | ICD-10-CM | POA: Diagnosis not present

## 2017-05-13 DIAGNOSIS — H40023 Open angle with borderline findings, high risk, bilateral: Secondary | ICD-10-CM | POA: Diagnosis not present

## 2017-05-17 ENCOUNTER — Ambulatory Visit (HOSPITAL_COMMUNITY): Payer: Medicare Other

## 2017-05-19 ENCOUNTER — Ambulatory Visit (HOSPITAL_COMMUNITY): Payer: Medicare Other

## 2017-05-19 ENCOUNTER — Encounter (HOSPITAL_COMMUNITY): Payer: Self-pay

## 2017-05-26 ENCOUNTER — Ambulatory Visit (HOSPITAL_COMMUNITY)
Admission: RE | Admit: 2017-05-26 | Discharge: 2017-05-26 | Disposition: A | Discharge status: Home / Self Care | Payer: Medicare Other | Source: Ambulatory Visit | Attending: Cardiology | Admitting: Cardiology

## 2017-05-26 DIAGNOSIS — D649 Anemia, unspecified: Secondary | ICD-10-CM | POA: Diagnosis not present

## 2017-05-26 DIAGNOSIS — E559 Vitamin D deficiency, unspecified: Secondary | ICD-10-CM | POA: Diagnosis not present

## 2017-05-26 DIAGNOSIS — R079 Chest pain, unspecified: Secondary | ICD-10-CM | POA: Diagnosis not present

## 2017-05-26 DIAGNOSIS — I25119 Atherosclerotic heart disease of native coronary artery with unspecified angina pectoris: Secondary | ICD-10-CM | POA: Diagnosis not present

## 2017-05-26 DIAGNOSIS — R9431 Abnormal electrocardiogram [ECG] [EKG]: Secondary | ICD-10-CM | POA: Diagnosis not present

## 2017-05-26 DIAGNOSIS — E785 Hyperlipidemia, unspecified: Secondary | ICD-10-CM | POA: Diagnosis not present

## 2017-05-26 DIAGNOSIS — I1310 Hypertensive heart and chronic kidney disease without heart failure, with stage 1 through stage 4 chronic kidney disease, or unspecified chronic kidney disease: Secondary | ICD-10-CM | POA: Diagnosis not present

## 2017-05-26 DIAGNOSIS — R067 Sneezing: Secondary | ICD-10-CM | POA: Diagnosis not present

## 2017-05-26 DIAGNOSIS — M1990 Unspecified osteoarthritis, unspecified site: Secondary | ICD-10-CM | POA: Diagnosis not present

## 2017-05-26 DIAGNOSIS — N189 Chronic kidney disease, unspecified: Secondary | ICD-10-CM | POA: Diagnosis not present

## 2017-05-26 MED ORDER — REGADENOSON 0.4 MG/5ML IV SOLN
INTRAVENOUS | Status: AC
  Administered 2017-05-26: 0.4 mg via INTRAVENOUS
  Filled 2017-05-26: qty 5

## 2017-05-26 MED ORDER — TECHNETIUM TC 99M TETROFOSMIN IV KIT
30.0000 | PACK | Freq: Once | INTRAVENOUS | Status: AC | PRN
  Administered 2017-05-26: 30 via INTRAVENOUS

## 2017-05-26 MED ORDER — TECHNETIUM TC 99M TETROFOSMIN IV KIT
10.0000 | PACK | Freq: Once | INTRAVENOUS | Status: AC | PRN
  Administered 2017-05-26: 10 via INTRAVENOUS

## 2017-05-26 MED ORDER — REGADENOSON 0.4 MG/5ML IV SOLN
0.4000 mg | Freq: Once | INTRAVENOUS | Status: AC
  Administered 2017-05-26: 0.4 mg via INTRAVENOUS

## 2017-06-19 ENCOUNTER — Other Ambulatory Visit: Payer: Self-pay | Admitting: Hematology & Oncology

## 2017-06-19 DIAGNOSIS — D529 Folate deficiency anemia, unspecified: Secondary | ICD-10-CM

## 2017-07-01 DIAGNOSIS — H26492 Other secondary cataract, left eye: Secondary | ICD-10-CM | POA: Diagnosis not present

## 2017-07-01 DIAGNOSIS — Z961 Presence of intraocular lens: Secondary | ICD-10-CM | POA: Diagnosis not present

## 2017-07-08 ENCOUNTER — Ambulatory Visit: Payer: Medicare Other | Admitting: Podiatry

## 2017-07-13 DIAGNOSIS — M159 Polyosteoarthritis, unspecified: Secondary | ICD-10-CM | POA: Diagnosis not present

## 2017-07-17 DIAGNOSIS — I10 Essential (primary) hypertension: Secondary | ICD-10-CM | POA: Diagnosis not present

## 2017-07-17 DIAGNOSIS — M6281 Muscle weakness (generalized): Secondary | ICD-10-CM | POA: Diagnosis not present

## 2017-07-17 DIAGNOSIS — D649 Anemia, unspecified: Secondary | ICD-10-CM | POA: Diagnosis not present

## 2017-07-17 DIAGNOSIS — M159 Polyosteoarthritis, unspecified: Secondary | ICD-10-CM | POA: Diagnosis not present

## 2017-07-17 DIAGNOSIS — I739 Peripheral vascular disease, unspecified: Secondary | ICD-10-CM | POA: Diagnosis not present

## 2017-07-17 DIAGNOSIS — M2530 Other instability, unspecified joint: Secondary | ICD-10-CM | POA: Diagnosis not present

## 2017-07-19 ENCOUNTER — Other Ambulatory Visit: Payer: Self-pay | Admitting: Hematology & Oncology

## 2017-07-19 DIAGNOSIS — M2530 Other instability, unspecified joint: Secondary | ICD-10-CM | POA: Diagnosis not present

## 2017-07-19 DIAGNOSIS — I10 Essential (primary) hypertension: Secondary | ICD-10-CM | POA: Diagnosis not present

## 2017-07-19 DIAGNOSIS — D529 Folate deficiency anemia, unspecified: Secondary | ICD-10-CM

## 2017-07-19 DIAGNOSIS — D649 Anemia, unspecified: Secondary | ICD-10-CM | POA: Diagnosis not present

## 2017-07-19 DIAGNOSIS — I739 Peripheral vascular disease, unspecified: Secondary | ICD-10-CM | POA: Diagnosis not present

## 2017-07-19 DIAGNOSIS — M159 Polyosteoarthritis, unspecified: Secondary | ICD-10-CM | POA: Diagnosis not present

## 2017-07-19 DIAGNOSIS — M6281 Muscle weakness (generalized): Secondary | ICD-10-CM | POA: Diagnosis not present

## 2017-07-20 DIAGNOSIS — M159 Polyosteoarthritis, unspecified: Secondary | ICD-10-CM | POA: Diagnosis not present

## 2017-07-20 DIAGNOSIS — M6281 Muscle weakness (generalized): Secondary | ICD-10-CM | POA: Diagnosis not present

## 2017-07-20 DIAGNOSIS — I739 Peripheral vascular disease, unspecified: Secondary | ICD-10-CM | POA: Diagnosis not present

## 2017-07-20 DIAGNOSIS — D649 Anemia, unspecified: Secondary | ICD-10-CM | POA: Diagnosis not present

## 2017-07-20 DIAGNOSIS — I10 Essential (primary) hypertension: Secondary | ICD-10-CM | POA: Diagnosis not present

## 2017-07-20 DIAGNOSIS — M2530 Other instability, unspecified joint: Secondary | ICD-10-CM | POA: Diagnosis not present

## 2017-07-21 DIAGNOSIS — E559 Vitamin D deficiency, unspecified: Secondary | ICD-10-CM | POA: Diagnosis not present

## 2017-07-21 DIAGNOSIS — E785 Hyperlipidemia, unspecified: Secondary | ICD-10-CM | POA: Diagnosis not present

## 2017-07-21 DIAGNOSIS — D649 Anemia, unspecified: Secondary | ICD-10-CM | POA: Diagnosis not present

## 2017-07-21 DIAGNOSIS — M1990 Unspecified osteoarthritis, unspecified site: Secondary | ICD-10-CM | POA: Diagnosis not present

## 2017-07-21 DIAGNOSIS — I208 Other forms of angina pectoris: Secondary | ICD-10-CM | POA: Diagnosis not present

## 2017-07-21 DIAGNOSIS — I5030 Unspecified diastolic (congestive) heart failure: Secondary | ICD-10-CM | POA: Diagnosis not present

## 2017-07-21 DIAGNOSIS — I1310 Hypertensive heart and chronic kidney disease without heart failure, with stage 1 through stage 4 chronic kidney disease, or unspecified chronic kidney disease: Secondary | ICD-10-CM | POA: Diagnosis not present

## 2017-07-21 DIAGNOSIS — N189 Chronic kidney disease, unspecified: Secondary | ICD-10-CM | POA: Diagnosis not present

## 2017-07-23 DIAGNOSIS — M159 Polyosteoarthritis, unspecified: Secondary | ICD-10-CM | POA: Diagnosis not present

## 2017-07-23 DIAGNOSIS — D649 Anemia, unspecified: Secondary | ICD-10-CM | POA: Diagnosis not present

## 2017-07-23 DIAGNOSIS — I739 Peripheral vascular disease, unspecified: Secondary | ICD-10-CM | POA: Diagnosis not present

## 2017-07-23 DIAGNOSIS — M6281 Muscle weakness (generalized): Secondary | ICD-10-CM | POA: Diagnosis not present

## 2017-07-23 DIAGNOSIS — M2530 Other instability, unspecified joint: Secondary | ICD-10-CM | POA: Diagnosis not present

## 2017-07-23 DIAGNOSIS — I10 Essential (primary) hypertension: Secondary | ICD-10-CM | POA: Diagnosis not present

## 2017-07-28 ENCOUNTER — Ambulatory Visit: Payer: Medicare Other | Admitting: Podiatry

## 2017-07-28 DIAGNOSIS — I10 Essential (primary) hypertension: Secondary | ICD-10-CM | POA: Diagnosis not present

## 2017-07-28 DIAGNOSIS — M159 Polyosteoarthritis, unspecified: Secondary | ICD-10-CM | POA: Diagnosis not present

## 2017-07-28 DIAGNOSIS — M2530 Other instability, unspecified joint: Secondary | ICD-10-CM | POA: Diagnosis not present

## 2017-07-28 DIAGNOSIS — M6281 Muscle weakness (generalized): Secondary | ICD-10-CM | POA: Diagnosis not present

## 2017-07-28 DIAGNOSIS — I739 Peripheral vascular disease, unspecified: Secondary | ICD-10-CM | POA: Diagnosis not present

## 2017-07-28 DIAGNOSIS — D649 Anemia, unspecified: Secondary | ICD-10-CM | POA: Diagnosis not present

## 2017-07-30 DIAGNOSIS — D649 Anemia, unspecified: Secondary | ICD-10-CM | POA: Diagnosis not present

## 2017-07-30 DIAGNOSIS — I10 Essential (primary) hypertension: Secondary | ICD-10-CM | POA: Diagnosis not present

## 2017-07-30 DIAGNOSIS — M6281 Muscle weakness (generalized): Secondary | ICD-10-CM | POA: Diagnosis not present

## 2017-07-30 DIAGNOSIS — I739 Peripheral vascular disease, unspecified: Secondary | ICD-10-CM | POA: Diagnosis not present

## 2017-07-30 DIAGNOSIS — M159 Polyosteoarthritis, unspecified: Secondary | ICD-10-CM | POA: Diagnosis not present

## 2017-07-30 DIAGNOSIS — M2530 Other instability, unspecified joint: Secondary | ICD-10-CM | POA: Diagnosis not present

## 2017-08-03 ENCOUNTER — Ambulatory Visit: Payer: Self-pay | Admitting: Podiatry

## 2017-08-03 DIAGNOSIS — D649 Anemia, unspecified: Secondary | ICD-10-CM | POA: Diagnosis not present

## 2017-08-03 DIAGNOSIS — M159 Polyosteoarthritis, unspecified: Secondary | ICD-10-CM | POA: Diagnosis not present

## 2017-08-03 DIAGNOSIS — M2530 Other instability, unspecified joint: Secondary | ICD-10-CM | POA: Diagnosis not present

## 2017-08-03 DIAGNOSIS — I10 Essential (primary) hypertension: Secondary | ICD-10-CM | POA: Diagnosis not present

## 2017-08-03 DIAGNOSIS — I739 Peripheral vascular disease, unspecified: Secondary | ICD-10-CM | POA: Diagnosis not present

## 2017-08-03 DIAGNOSIS — M6281 Muscle weakness (generalized): Secondary | ICD-10-CM | POA: Diagnosis not present

## 2017-08-05 DIAGNOSIS — I10 Essential (primary) hypertension: Secondary | ICD-10-CM | POA: Diagnosis not present

## 2017-08-05 DIAGNOSIS — M6281 Muscle weakness (generalized): Secondary | ICD-10-CM | POA: Diagnosis not present

## 2017-08-05 DIAGNOSIS — D649 Anemia, unspecified: Secondary | ICD-10-CM | POA: Diagnosis not present

## 2017-08-05 DIAGNOSIS — I739 Peripheral vascular disease, unspecified: Secondary | ICD-10-CM | POA: Diagnosis not present

## 2017-08-05 DIAGNOSIS — M2530 Other instability, unspecified joint: Secondary | ICD-10-CM | POA: Diagnosis not present

## 2017-08-05 DIAGNOSIS — M159 Polyosteoarthritis, unspecified: Secondary | ICD-10-CM | POA: Diagnosis not present

## 2017-08-06 DIAGNOSIS — D649 Anemia, unspecified: Secondary | ICD-10-CM | POA: Diagnosis not present

## 2017-08-06 DIAGNOSIS — M159 Polyosteoarthritis, unspecified: Secondary | ICD-10-CM | POA: Diagnosis not present

## 2017-08-06 DIAGNOSIS — M6281 Muscle weakness (generalized): Secondary | ICD-10-CM | POA: Diagnosis not present

## 2017-08-06 DIAGNOSIS — I10 Essential (primary) hypertension: Secondary | ICD-10-CM | POA: Diagnosis not present

## 2017-08-06 DIAGNOSIS — M2530 Other instability, unspecified joint: Secondary | ICD-10-CM | POA: Diagnosis not present

## 2017-08-06 DIAGNOSIS — I739 Peripheral vascular disease, unspecified: Secondary | ICD-10-CM | POA: Diagnosis not present

## 2017-08-11 DIAGNOSIS — I10 Essential (primary) hypertension: Secondary | ICD-10-CM | POA: Diagnosis not present

## 2017-08-11 DIAGNOSIS — M2530 Other instability, unspecified joint: Secondary | ICD-10-CM | POA: Diagnosis not present

## 2017-08-11 DIAGNOSIS — M159 Polyosteoarthritis, unspecified: Secondary | ICD-10-CM | POA: Diagnosis not present

## 2017-08-11 DIAGNOSIS — M6281 Muscle weakness (generalized): Secondary | ICD-10-CM | POA: Diagnosis not present

## 2017-08-11 DIAGNOSIS — D649 Anemia, unspecified: Secondary | ICD-10-CM | POA: Diagnosis not present

## 2017-08-11 DIAGNOSIS — I739 Peripheral vascular disease, unspecified: Secondary | ICD-10-CM | POA: Diagnosis not present

## 2017-08-13 DIAGNOSIS — M2530 Other instability, unspecified joint: Secondary | ICD-10-CM | POA: Diagnosis not present

## 2017-08-13 DIAGNOSIS — I739 Peripheral vascular disease, unspecified: Secondary | ICD-10-CM | POA: Diagnosis not present

## 2017-08-13 DIAGNOSIS — D649 Anemia, unspecified: Secondary | ICD-10-CM | POA: Diagnosis not present

## 2017-08-13 DIAGNOSIS — I10 Essential (primary) hypertension: Secondary | ICD-10-CM | POA: Diagnosis not present

## 2017-08-13 DIAGNOSIS — M6281 Muscle weakness (generalized): Secondary | ICD-10-CM | POA: Diagnosis not present

## 2017-08-13 DIAGNOSIS — M159 Polyosteoarthritis, unspecified: Secondary | ICD-10-CM | POA: Diagnosis not present

## 2017-08-16 ENCOUNTER — Other Ambulatory Visit: Payer: Self-pay | Admitting: Hematology & Oncology

## 2017-08-16 DIAGNOSIS — D529 Folate deficiency anemia, unspecified: Secondary | ICD-10-CM

## 2017-08-17 DIAGNOSIS — M159 Polyosteoarthritis, unspecified: Secondary | ICD-10-CM | POA: Diagnosis not present

## 2017-08-17 DIAGNOSIS — I10 Essential (primary) hypertension: Secondary | ICD-10-CM | POA: Diagnosis not present

## 2017-08-17 DIAGNOSIS — I739 Peripheral vascular disease, unspecified: Secondary | ICD-10-CM | POA: Diagnosis not present

## 2017-08-17 DIAGNOSIS — M2530 Other instability, unspecified joint: Secondary | ICD-10-CM | POA: Diagnosis not present

## 2017-08-17 DIAGNOSIS — M6281 Muscle weakness (generalized): Secondary | ICD-10-CM | POA: Diagnosis not present

## 2017-08-17 DIAGNOSIS — D649 Anemia, unspecified: Secondary | ICD-10-CM | POA: Diagnosis not present

## 2017-08-18 DIAGNOSIS — M159 Polyosteoarthritis, unspecified: Secondary | ICD-10-CM | POA: Diagnosis not present

## 2017-08-18 DIAGNOSIS — M2530 Other instability, unspecified joint: Secondary | ICD-10-CM | POA: Diagnosis not present

## 2017-08-18 DIAGNOSIS — I10 Essential (primary) hypertension: Secondary | ICD-10-CM | POA: Diagnosis not present

## 2017-08-18 DIAGNOSIS — D649 Anemia, unspecified: Secondary | ICD-10-CM | POA: Diagnosis not present

## 2017-08-18 DIAGNOSIS — M6281 Muscle weakness (generalized): Secondary | ICD-10-CM | POA: Diagnosis not present

## 2017-08-18 DIAGNOSIS — I739 Peripheral vascular disease, unspecified: Secondary | ICD-10-CM | POA: Diagnosis not present

## 2017-08-19 DIAGNOSIS — M159 Polyosteoarthritis, unspecified: Secondary | ICD-10-CM | POA: Diagnosis not present

## 2017-08-19 DIAGNOSIS — I739 Peripheral vascular disease, unspecified: Secondary | ICD-10-CM | POA: Diagnosis not present

## 2017-08-19 DIAGNOSIS — D649 Anemia, unspecified: Secondary | ICD-10-CM | POA: Diagnosis not present

## 2017-08-19 DIAGNOSIS — M6281 Muscle weakness (generalized): Secondary | ICD-10-CM | POA: Diagnosis not present

## 2017-08-19 DIAGNOSIS — I10 Essential (primary) hypertension: Secondary | ICD-10-CM | POA: Diagnosis not present

## 2017-08-19 DIAGNOSIS — M2530 Other instability, unspecified joint: Secondary | ICD-10-CM | POA: Diagnosis not present

## 2017-08-25 DIAGNOSIS — M6281 Muscle weakness (generalized): Secondary | ICD-10-CM | POA: Diagnosis not present

## 2017-08-25 DIAGNOSIS — D649 Anemia, unspecified: Secondary | ICD-10-CM | POA: Diagnosis not present

## 2017-08-25 DIAGNOSIS — M159 Polyosteoarthritis, unspecified: Secondary | ICD-10-CM | POA: Diagnosis not present

## 2017-08-25 DIAGNOSIS — M2530 Other instability, unspecified joint: Secondary | ICD-10-CM | POA: Diagnosis not present

## 2017-08-25 DIAGNOSIS — I10 Essential (primary) hypertension: Secondary | ICD-10-CM | POA: Diagnosis not present

## 2017-08-25 DIAGNOSIS — I739 Peripheral vascular disease, unspecified: Secondary | ICD-10-CM | POA: Diagnosis not present

## 2017-08-31 ENCOUNTER — Ambulatory Visit (INDEPENDENT_AMBULATORY_CARE_PROVIDER_SITE_OTHER): Payer: Medicare Other | Admitting: Podiatry

## 2017-08-31 ENCOUNTER — Encounter: Payer: Self-pay | Admitting: Podiatry

## 2017-08-31 DIAGNOSIS — B351 Tinea unguium: Secondary | ICD-10-CM

## 2017-08-31 DIAGNOSIS — M79676 Pain in unspecified toe(s): Secondary | ICD-10-CM | POA: Diagnosis not present

## 2017-08-31 NOTE — Progress Notes (Signed)
Patient ID: Brittany Weber, female   DOB: [birth year], 83 y.o.   MRN: [id number] Complaint:  Visit Type: Patient returns to my office for continued preventative foot care services. Complaint: Patient states" my nails have grown long and thick and become painful to walk and wear shoes" . The patient presents for preventative foot care services. No changes to ROS  Podiatric Exam: Vascular: dorsalis pedis and posterior tibial pulses are palpable bilateral. Capillary return is immediate. Temperature gradient is WNL. Skin turgor WNL  Sensorium: Normal Semmes Weinstein monofilament test. Normal tactile sensation bilaterally. Nail Exam: Pt has thick disfigured discolored nails with subungual debris noted bilateral entire nail hallux through fifth toenails Ulcer Exam: There is no evidence of ulcer or pre-ulcerative changes or infection. Orthopedic Exam: Muscle tone and strength are WNL. No limitations in general ROM. No crepitus or effusions noted. Foot type and digits show no abnormalities. Bony prominences are unremarkable.Midfoot arthritis B/L .Skin: No Porokeratosis. No infection or ulcers  Diagnosis:  Onychomycosis, , Pain in right toe, pain in left toes  Treatment & Plan Procedures and Treatment: Consent by patient was obtained for treatment procedures. The patient understood the discussion of treatment and procedures well. All questions were answered thoroughly reviewed. Debridement of mycotic and hypertrophic toenails, 1 through 5 bilateral and clearing of subungual debris. No ulceration, no infection noted.  Return Visit-Office Procedure: Patient instructed to return to the office for a follow up visit 10 weeks  for continued evaluation and treatment.  [name] DPM

## 2017-09-02 DIAGNOSIS — M2530 Other instability, unspecified joint: Secondary | ICD-10-CM | POA: Diagnosis not present

## 2017-09-02 DIAGNOSIS — I739 Peripheral vascular disease, unspecified: Secondary | ICD-10-CM | POA: Diagnosis not present

## 2017-09-02 DIAGNOSIS — D649 Anemia, unspecified: Secondary | ICD-10-CM | POA: Diagnosis not present

## 2017-09-02 DIAGNOSIS — M159 Polyosteoarthritis, unspecified: Secondary | ICD-10-CM | POA: Diagnosis not present

## 2017-09-02 DIAGNOSIS — I10 Essential (primary) hypertension: Secondary | ICD-10-CM | POA: Diagnosis not present

## 2017-09-02 DIAGNOSIS — M6281 Muscle weakness (generalized): Secondary | ICD-10-CM | POA: Diagnosis not present

## 2017-09-07 ENCOUNTER — Ambulatory Visit: Payer: Medicare Other

## 2017-09-07 ENCOUNTER — Other Ambulatory Visit (HOSPITAL_BASED_OUTPATIENT_CLINIC_OR_DEPARTMENT_OTHER): Payer: Medicare Other

## 2017-09-07 ENCOUNTER — Ambulatory Visit (HOSPITAL_BASED_OUTPATIENT_CLINIC_OR_DEPARTMENT_OTHER): Payer: Medicare Other | Admitting: Family

## 2017-09-07 VITALS — BP 142/55 | HR 68 | Temp 98.2°F | Resp 17 | Wt 158.0 lb

## 2017-09-07 DIAGNOSIS — N189 Chronic kidney disease, unspecified: Secondary | ICD-10-CM

## 2017-09-07 DIAGNOSIS — D508 Other iron deficiency anemias: Secondary | ICD-10-CM | POA: Diagnosis not present

## 2017-09-07 DIAGNOSIS — D631 Anemia in chronic kidney disease: Secondary | ICD-10-CM

## 2017-09-07 LAB — CBC WITH DIFFERENTIAL (CANCER CENTER ONLY)
BASO#: 0 10*3/uL (ref 0.0–0.2)
BASO%: 0.7 % (ref 0.0–2.0)
EOS ABS: 0.1 10*3/uL (ref 0.0–0.5)
EOS%: 2.2 % (ref 0.0–7.0)
HCT: 35.6 % (ref 34.8–46.6)
HGB: 11.4 g/dL — ABNORMAL LOW (ref 11.6–15.9)
LYMPH#: 1.7 10*3/uL (ref 0.9–3.3)
LYMPH%: 37 % (ref 14.0–48.0)
MCH: 28.4 pg (ref 26.0–34.0)
MCHC: 32 g/dL (ref 32.0–36.0)
MCV: 89 fL (ref 81–101)
MONO#: 0.5 10*3/uL (ref 0.1–0.9)
MONO%: 11.3 % (ref 0.0–13.0)
NEUT#: 2.3 10*3/uL (ref 1.5–6.5)
NEUT%: 48.8 % (ref 39.6–80.0)
PLATELETS: 204 10*3/uL (ref 145–400)
RBC: 4.01 10*6/uL (ref 3.70–5.32)
RDW: 14.7 % (ref 11.1–15.7)
WBC: 4.6 10*3/uL (ref 3.9–10.0)

## 2017-09-07 LAB — CMP (CANCER CENTER ONLY)
ALT(SGPT): 22 U/L (ref 10–47)
AST: 23 U/L (ref 11–38)
Albumin: 3.9 g/dL (ref 3.3–5.5)
Alkaline Phosphatase: 52 U/L (ref 26–84)
BUN: 20 mg/dL (ref 7–22)
CHLORIDE: 108 meq/L (ref 98–108)
CO2: 30 meq/L (ref 18–33)
CREATININE: 1.2 mg/dL (ref 0.6–1.2)
Calcium: 10.1 mg/dL (ref 8.0–10.3)
GLUCOSE: 91 mg/dL (ref 73–118)
POTASSIUM: 3.6 meq/L (ref 3.3–4.7)
SODIUM: 146 meq/L — AB (ref 128–145)
Total Bilirubin: 0.5 mg/dl (ref 0.20–1.60)
Total Protein: 8.3 g/dL — ABNORMAL HIGH (ref 6.4–8.1)

## 2017-09-07 LAB — IRON AND TIBC
%SAT: 24 % (ref 21–57)
Iron: 61 ug/dL (ref 41–142)
TIBC: 259 ug/dL (ref 236–444)
UIBC: 197 ug/dL (ref 120–384)

## 2017-09-07 LAB — RETICULOCYTES: Reticulocyte Count: 1.3 % (ref 0.6–2.6)

## 2017-09-07 LAB — FERRITIN: Ferritin: 1002 ng/ml — ABNORMAL HIGH (ref 9–269)

## 2017-09-07 LAB — CHCC SATELLITE - SMEAR

## 2017-09-07 NOTE — Progress Notes (Signed)
Hematology and Oncology Follow Up Visit  ODESSIA ASLESON [id number] 1932-11-09 83 y.o. 09/07/2017   Principle Diagnosis:  Anemia of chronic renal insufficiency Intermittent iron deficiency anemia  Current Therapy:   Aranesp 300 mcg subcutaneous as needed for hemoglobin less than 11 IV iron as indicated   Interim History:  Ms. Eckert is here today for follow-up. She is having some occasional fatigue. Her Hgb is stable at 11.4 so she will not need Aranesp today. Iron studies are pending.  She has some generalized aches and pains from arthritis. This is unchanged and will wax and wane with the weather.  She has had no fever, chills, n/v, cough, rash, dizziness, SOB, chest pain, palpitations, abdominal pain or changes in bowel or bladder habits.  No swelling, numbness or tingling in her extremities.  Her appetite is good and she is trying to stay well hydrated. Her weight is stable.   ECOG Performance Status: 1 - Symptomatic but completely ambulatory  Medications:  Allergies as of 09/07/2017      Reactions   Amlodipine    Amoxicillin    Ampicillin    Ascorbic Acid    Aspirin    Calcium Carbonate    Cholecalciferol    Cilostazol    Clarithromycin    Clarithromycin    Codeine    Darvocet [propoxyphene N-acetaminophen]    Demerol    Dexamethasone    Diazepam    Diclofenac    Emetrol    Erythromycin    Erythromycin Base    Folic Acid    Indomethacin    Indomethacin    Iron    Pt states she is "allergic to iron pills"- no anaphylaxis to Iron. Tolerates Feraheme IV   Latex    Lisinopril    Meperidine    Oxycodone    Sulfa Antibiotics    Sulfonamide Derivatives    Valium       Medication List       Accurate as of 09/07/17 12:08 PM. Always use your most recent med list.          amLODipine-olmesartan 10-40 MG tablet Commonly known as:  AZOR   calcium carbonate 1250 (500 Ca) MG chewable tablet Commonly known as:  OS-CAL Chew 1 tablet by mouth daily. Pt  takes Calcrate   cilostazol 100 MG tablet Commonly known as:  PLETAL Take 100 mg by mouth 2 (two) times daily.   dexamethasone 4 MG tablet Commonly known as:  DECADRON Take 4 mg by mouth as needed (for pain).   folic acid 1 MG tablet Commonly known as:  FOLVITE TAKE 1 TABLET TWICE DAILY.   losartan 100 MG tablet Commonly known as:  COZAAR Take 100 mg by mouth daily.   meloxicam 7.5 MG tablet Commonly known as:  MOBIC meloxicam 7.5 mg tablet   NON FORMULARY Take by mouth 3 (three) times a week. Cleanse-more   PAPAYA ENZYMES PO Take by mouth 3 (three) times daily after meals.   vitamin C 500 MG tablet Commonly known as:  ASCORBIC ACID Take 500 mg by mouth daily. Take 2 po   VITAMIN D-3 PO Take by mouth every morning.   VOLTAREN 1 % Gel Generic drug:  diclofenac sodium Apply topically 4 (four) times daily.   WHITE WILLOW BARK PO Take by mouth daily. " Pt states that white willow bark is natural aspirin "       Allergies:  Allergies  Allergen Reactions  . Amlodipine   . Amoxicillin   .  Ampicillin   . Ascorbic Acid   . Aspirin   . Calcium Carbonate   . Cholecalciferol   . Cilostazol   . Clarithromycin   . Clarithromycin   . Codeine   . Darvocet [Propoxyphene N-Acetaminophen]   . Demerol   . Dexamethasone   . Diazepam   . Diclofenac   . Emetrol   . Erythromycin   . Erythromycin Base   . Folic Acid   . Indomethacin   . Indomethacin   . Iron     Pt states she is "allergic to iron pills"- no anaphylaxis to Iron. Tolerates Feraheme IV  . Latex   . Lisinopril   . Meperidine   . Oxycodone   . Sulfa Antibiotics   . Sulfonamide Derivatives   . Valium     Past Medical History, Surgical history, Social history, and Family History were reviewed and updated.  Review of Systems: All other 10 point review of systems is negative.   Physical Exam:  vitals were not taken for this visit.  Wt Readings from Last 3 Encounters:  03/08/17 163 lb (73.9 kg)   01/27/17 162 lb (73.5 kg)  11/19/16 162 lb (73.5 kg)    Ocular: Sclerae unicteric, pupils equal, round and reactive to light Ear-nose-throat: Oropharynx clear, dentition fair Lymphatic: No cervical, supraclavicular or axillary adenopathy Lungs no rales or rhonchi, good excursion bilaterally Heart regular rate and rhythm, no murmur appreciated Abd soft, nontender, positive bowel sounds, no liver or spleen tip palpated on exam, no fluid wave MSK no focal spinal tenderness, no joint edema Neuro: non-focal, well-oriented, appropriate affect Breasts: Deferred   Lab Results  Component Value Date   WBC 4.6 09/07/2017   HGB 11.4 (L) 09/07/2017   HCT 35.6 09/07/2017   MCV 89 09/07/2017   PLT 204 09/07/2017   Lab Results  Component Value Date   FERRITIN 1,103 (H) 03/08/2017   IRON 71 03/08/2017   TIBC 251 03/08/2017   UIBC 180 03/08/2017   IRONPCTSAT 28 03/08/2017   Lab Results  Component Value Date   RETICCTPCT 1.4 09/25/2015   RBC 4.01 09/07/2017   RETICCTABS 53.6 09/25/2015   No results found for: KPAFRELGTCHN, LAMBDASER, KAPLAMBRATIO No results found for: IGGSERUM, IGA, IGMSERUM No results found for: Marda Stalker, SPEI   Chemistry      Component Value Date/Time   NA 145 03/08/2017 1003   NA 144 03/13/2016 1205   K 3.8 03/08/2017 1003   K 3.6 03/13/2016 1205   CL 105 03/08/2017 1003   CO2 29 03/08/2017 1003   CO2 24 03/13/2016 1205   BUN 17 03/08/2017 1003   BUN 18.8 03/13/2016 1205   CREATININE 1.3 (H) 03/08/2017 1003   CREATININE 1.4 (H) 03/13/2016 1205      Component Value Date/Time   CALCIUM 10.3 03/08/2017 1003   CALCIUM 9.9 03/13/2016 1205   ALKPHOS 54 03/08/2017 1003   ALKPHOS 50 03/13/2016 1205   AST 23 03/08/2017 1003   AST 18 03/13/2016 1205   ALT 22 03/08/2017 1003   ALT 16 03/13/2016 1205   BILITOT 0.50 03/08/2017 1003   BILITOT 0.31 03/13/2016 1205      Impression and Plan: Ms. Roorda  is a very pleasant 83 yo African American female with multifactorial anemia, iron deficiency and anemia of chronic renal insufficiency. Her iron studies and Hgb today are stable. She will not need IV iron or Aranesp at this time. It has been quite a while  since she has required treatment.  We will go ahead and plan to see her back again in another 6 months for follow-up and repeat lab work.  She will contact our office with any questions or concerns. We can certainly see her sooner if need be.   Verdie Mosher, NP 10/2/201812:08 PM

## 2017-09-28 ENCOUNTER — Other Ambulatory Visit: Payer: Self-pay | Admitting: Hematology & Oncology

## 2017-09-28 DIAGNOSIS — D529 Folate deficiency anemia, unspecified: Secondary | ICD-10-CM

## 2017-10-27 DIAGNOSIS — M1990 Unspecified osteoarthritis, unspecified site: Secondary | ICD-10-CM | POA: Diagnosis not present

## 2017-10-27 DIAGNOSIS — I129 Hypertensive chronic kidney disease with stage 1 through stage 4 chronic kidney disease, or unspecified chronic kidney disease: Secondary | ICD-10-CM | POA: Diagnosis not present

## 2017-10-27 DIAGNOSIS — E785 Hyperlipidemia, unspecified: Secondary | ICD-10-CM | POA: Diagnosis not present

## 2017-10-27 DIAGNOSIS — R0609 Other forms of dyspnea: Secondary | ICD-10-CM | POA: Diagnosis not present

## 2017-10-27 DIAGNOSIS — I208 Other forms of angina pectoris: Secondary | ICD-10-CM | POA: Diagnosis not present

## 2017-10-27 DIAGNOSIS — D649 Anemia, unspecified: Secondary | ICD-10-CM | POA: Diagnosis not present

## 2017-10-27 DIAGNOSIS — E559 Vitamin D deficiency, unspecified: Secondary | ICD-10-CM | POA: Diagnosis not present

## 2017-10-27 DIAGNOSIS — N189 Chronic kidney disease, unspecified: Secondary | ICD-10-CM | POA: Diagnosis not present

## 2017-11-09 ENCOUNTER — Encounter: Payer: Self-pay | Admitting: Podiatry

## 2017-11-09 ENCOUNTER — Ambulatory Visit (INDEPENDENT_AMBULATORY_CARE_PROVIDER_SITE_OTHER): Payer: Medicare Other | Admitting: Podiatry

## 2017-11-09 DIAGNOSIS — B351 Tinea unguium: Secondary | ICD-10-CM

## 2017-11-09 DIAGNOSIS — M79676 Pain in unspecified toe(s): Secondary | ICD-10-CM | POA: Diagnosis not present

## 2017-11-09 NOTE — Progress Notes (Signed)
Patient ID: Brittany BastDoris J Arico, female   DOB: 26-Apr-1932, 83 y.o.   MRN: [id number] Complaint:  Visit Type: Patient returns to my office for continued preventative foot care services. Complaint: Patient states" my nails have grown long and thick and become painful to walk and wear shoes" . The patient presents for preventative foot care services. No changes to ROS.  Patient is taking pletal.  Podiatric Exam: Vascular: dorsalis pedis and posterior tibial pulses are palpable bilateral. Capillary return is immediate. Temperature gradient is WNL. Skin turgor WNL  Sensorium: Normal Semmes Weinstein monofilament test. Normal tactile sensation bilaterally. Nail Exam: Pt has thick disfigured discolored nails with subungual debris noted bilateral entire nail hallux through fifth toenails Ulcer Exam: There is no evidence of ulcer or pre-ulcerative changes or infection. Orthopedic Exam: Muscle tone and strength are WNL. No limitations in general ROM. No crepitus or effusions noted. Hammer toes  B/L.  Bony prominences are unremarkable.Midfoot arthritis B/L .Skin: No Porokeratosis. No infection or ulcers  Diagnosis:  Onychomycosis, , Pain in right toe, pain in left toes  Treatment & Plan Procedures and Treatment: Consent by patient was obtained for treatment procedures. The patient understood the discussion of treatment and procedures well. All questions were answered thoroughly reviewed. Debridement of mycotic and hypertrophic toenails, 1 through 5 bilateral and clearing of subungual debris. No ulceration, no infection noted.  Return Visit-Office Procedure: Patient instructed to return to the office for a follow up visit 10 weeks  for continued evaluation and treatment.  Helane GuntherGregory Lavon Bothwell DPM

## 2017-11-22 DIAGNOSIS — Z1231 Encounter for screening mammogram for malignant neoplasm of breast: Secondary | ICD-10-CM | POA: Diagnosis not present

## 2017-11-22 DIAGNOSIS — M810 Age-related osteoporosis without current pathological fracture: Secondary | ICD-10-CM | POA: Diagnosis not present

## 2017-11-23 ENCOUNTER — Other Ambulatory Visit: Payer: Self-pay | Admitting: Hematology & Oncology

## 2017-11-23 DIAGNOSIS — D529 Folate deficiency anemia, unspecified: Secondary | ICD-10-CM

## 2017-11-24 DIAGNOSIS — R0609 Other forms of dyspnea: Secondary | ICD-10-CM | POA: Diagnosis not present

## 2017-11-24 DIAGNOSIS — E785 Hyperlipidemia, unspecified: Secondary | ICD-10-CM | POA: Diagnosis not present

## 2017-11-24 DIAGNOSIS — N189 Chronic kidney disease, unspecified: Secondary | ICD-10-CM | POA: Diagnosis not present

## 2017-11-24 DIAGNOSIS — I129 Hypertensive chronic kidney disease with stage 1 through stage 4 chronic kidney disease, or unspecified chronic kidney disease: Secondary | ICD-10-CM | POA: Diagnosis not present

## 2017-11-24 DIAGNOSIS — I2510 Atherosclerotic heart disease of native coronary artery without angina pectoris: Secondary | ICD-10-CM | POA: Diagnosis not present

## 2017-12-23 DIAGNOSIS — Z961 Presence of intraocular lens: Secondary | ICD-10-CM | POA: Diagnosis not present

## 2017-12-23 DIAGNOSIS — H02054 Trichiasis without entropian left upper eyelid: Secondary | ICD-10-CM | POA: Diagnosis not present

## 2017-12-23 DIAGNOSIS — H04123 Dry eye syndrome of bilateral lacrimal glands: Secondary | ICD-10-CM | POA: Diagnosis not present

## 2017-12-23 DIAGNOSIS — H40023 Open angle with borderline findings, high risk, bilateral: Secondary | ICD-10-CM | POA: Diagnosis not present

## 2017-12-25 DIAGNOSIS — H409 Unspecified glaucoma: Secondary | ICD-10-CM | POA: Insufficient documentation

## 2017-12-27 ENCOUNTER — Other Ambulatory Visit: Payer: Self-pay | Admitting: Hematology & Oncology

## 2017-12-27 DIAGNOSIS — D529 Folate deficiency anemia, unspecified: Secondary | ICD-10-CM

## 2018-01-18 DIAGNOSIS — E785 Hyperlipidemia, unspecified: Secondary | ICD-10-CM | POA: Diagnosis not present

## 2018-01-18 DIAGNOSIS — I10 Essential (primary) hypertension: Secondary | ICD-10-CM | POA: Diagnosis not present

## 2018-01-18 DIAGNOSIS — E559 Vitamin D deficiency, unspecified: Secondary | ICD-10-CM | POA: Diagnosis not present

## 2018-01-21 ENCOUNTER — Ambulatory Visit: Payer: Medicare Other | Admitting: Podiatry

## 2018-02-02 ENCOUNTER — Ambulatory Visit: Payer: Medicare Other | Admitting: Podiatry

## 2018-02-04 ENCOUNTER — Ambulatory Visit (INDEPENDENT_AMBULATORY_CARE_PROVIDER_SITE_OTHER): Payer: Medicare Other | Admitting: Podiatry

## 2018-02-04 ENCOUNTER — Encounter: Payer: Self-pay | Admitting: Podiatry

## 2018-02-04 DIAGNOSIS — M79676 Pain in unspecified toe(s): Secondary | ICD-10-CM

## 2018-02-04 DIAGNOSIS — M2010 Hallux valgus (acquired), unspecified foot: Secondary | ICD-10-CM

## 2018-02-04 DIAGNOSIS — B351 Tinea unguium: Secondary | ICD-10-CM | POA: Diagnosis not present

## 2018-02-04 NOTE — Progress Notes (Signed)
Patient ID: Brittany Weber, female   DOB: 07/06/1932, 85 y.o.   MRN: [id number] Complaint:  Visit Type: Patient returns to my office for continued preventative foot care services. Complaint: Patient states" my nails have grown long and thick and become painful to walk and wear shoes" . The patient presents for preventative foot care services. No changes to ROS.  Patient is taking pletal.  Podiatric Exam: Vascular: dorsalis pedis and posterior tibial pulses are palpable bilateral. Capillary return is immediate. Temperature gradient is WNL. Skin turgor WNL  Sensorium: Normal Semmes Weinstein monofilament test. Normal tactile sensation bilaterally. Nail Exam: Pt has thick disfigured discolored nails with subungual debris noted bilateral entire nail hallux through fifth toenails Ulcer Exam: There is no evidence of ulcer or pre-ulcerative changes or infection. Orthopedic Exam: Muscle tone and strength are WNL. No limitations in general ROM. No crepitus or effusions noted. Hammer toes  B/L.  Bony prominences are unremarkable.Midfoot arthritis B/L .Skin: No Porokeratosis. No infection or ulcers  Diagnosis:  Onychomycosis, , Pain in right toe, pain in left toes  Treatment & Plan Procedures and Treatment: Consent by patient was obtained for treatment procedures. The patient understood the discussion of treatment and procedures well. All questions were answered thoroughly reviewed. Debridement of mycotic and hypertrophic toenails, 1 through 5 bilateral and clearing of subungual debris. No ulceration, no infection noted.  Return Visit-Office Procedure: Patient instructed to return to the office for a follow up visit 10 weeks  for continued evaluation and treatment.  [name] DPM 

## 2018-02-07 ENCOUNTER — Other Ambulatory Visit: Payer: Self-pay | Admitting: Hematology & Oncology

## 2018-02-07 DIAGNOSIS — D529 Folate deficiency anemia, unspecified: Secondary | ICD-10-CM

## 2018-02-11 DIAGNOSIS — E785 Hyperlipidemia, unspecified: Secondary | ICD-10-CM | POA: Diagnosis not present

## 2018-02-11 DIAGNOSIS — I208 Other forms of angina pectoris: Secondary | ICD-10-CM | POA: Diagnosis not present

## 2018-02-11 DIAGNOSIS — I129 Hypertensive chronic kidney disease with stage 1 through stage 4 chronic kidney disease, or unspecified chronic kidney disease: Secondary | ICD-10-CM | POA: Diagnosis not present

## 2018-02-11 DIAGNOSIS — D649 Anemia, unspecified: Secondary | ICD-10-CM | POA: Diagnosis not present

## 2018-02-11 DIAGNOSIS — N189 Chronic kidney disease, unspecified: Secondary | ICD-10-CM | POA: Diagnosis not present

## 2018-02-11 DIAGNOSIS — I10 Essential (primary) hypertension: Secondary | ICD-10-CM | POA: Diagnosis not present

## 2018-02-11 DIAGNOSIS — E559 Vitamin D deficiency, unspecified: Secondary | ICD-10-CM | POA: Diagnosis not present

## 2018-02-11 DIAGNOSIS — M1990 Unspecified osteoarthritis, unspecified site: Secondary | ICD-10-CM | POA: Diagnosis not present

## 2018-03-09 ENCOUNTER — Encounter: Payer: Self-pay | Admitting: Family

## 2018-03-09 ENCOUNTER — Inpatient Hospital Stay: Payer: Medicare Other

## 2018-03-09 ENCOUNTER — Inpatient Hospital Stay: Payer: Medicare Other | Attending: Family | Admitting: Family

## 2018-03-09 ENCOUNTER — Other Ambulatory Visit: Payer: Self-pay

## 2018-03-09 VITALS — BP 128/55 | HR 75 | Temp 97.8°F | Wt 161.0 lb

## 2018-03-09 DIAGNOSIS — R0602 Shortness of breath: Secondary | ICD-10-CM | POA: Diagnosis not present

## 2018-03-09 DIAGNOSIS — D631 Anemia in chronic kidney disease: Secondary | ICD-10-CM | POA: Diagnosis not present

## 2018-03-09 DIAGNOSIS — R5383 Other fatigue: Secondary | ICD-10-CM | POA: Diagnosis not present

## 2018-03-09 DIAGNOSIS — D509 Iron deficiency anemia, unspecified: Secondary | ICD-10-CM | POA: Diagnosis not present

## 2018-03-09 DIAGNOSIS — N189 Chronic kidney disease, unspecified: Secondary | ICD-10-CM

## 2018-03-09 DIAGNOSIS — Z7982 Long term (current) use of aspirin: Secondary | ICD-10-CM

## 2018-03-09 DIAGNOSIS — Z79899 Other long term (current) drug therapy: Secondary | ICD-10-CM | POA: Diagnosis not present

## 2018-03-09 DIAGNOSIS — D508 Other iron deficiency anemias: Secondary | ICD-10-CM

## 2018-03-09 LAB — CMP (CANCER CENTER ONLY)
ALBUMIN: 4.1 g/dL (ref 3.5–5.0)
ALK PHOS: 57 U/L (ref 40–150)
ALT: 17 U/L (ref 0–55)
ANION GAP: 10 (ref 3–11)
AST: 18 U/L (ref 5–34)
BUN: 17 mg/dL (ref 7–26)
CALCIUM: 10.1 mg/dL (ref 8.4–10.4)
CHLORIDE: 107 mmol/L (ref 98–109)
CO2: 26 mmol/L (ref 22–29)
Creatinine: 1.19 mg/dL — ABNORMAL HIGH (ref 0.60–1.10)
GFR, EST AFRICAN AMERICAN: 47 mL/min — AB (ref >60)
GFR, Estimated: 41 mL/min — ABNORMAL LOW (ref >60)
GLUCOSE: 85 mg/dL (ref 70–140)
POTASSIUM: 3.9 mmol/L (ref 3.5–5.1)
SODIUM: 143 mmol/L (ref 136–145)
Total Bilirubin: 0.3 mg/dL (ref 0.2–1.2)
Total Protein: 8 g/dL (ref 6.4–8.3)

## 2018-03-09 LAB — FERRITIN: Ferritin: 963 ng/mL — ABNORMAL HIGH (ref 9–269)

## 2018-03-09 LAB — RETICULOCYTES
RBC.: 3.92 MIL/uL (ref 3.70–5.45)
RETIC COUNT ABSOLUTE: 39.2 10*3/uL (ref 33.7–90.7)
RETIC CT PCT: 1 % (ref 0.7–2.1)

## 2018-03-09 LAB — CBC WITH DIFFERENTIAL (CANCER CENTER ONLY)
BASOS PCT: 0 %
Basophils Absolute: 0 10*3/uL (ref 0.0–0.1)
EOS ABS: 0.1 10*3/uL (ref 0.0–0.5)
EOS PCT: 3 %
HCT: 34.6 % — ABNORMAL LOW (ref 34.8–46.6)
Hemoglobin: 11.1 g/dL — ABNORMAL LOW (ref 11.6–15.9)
Lymphocytes Relative: 31 %
Lymphs Abs: 1.4 10*3/uL (ref 0.9–3.3)
MCH: 28.5 pg (ref 26.0–34.0)
MCHC: 32.1 g/dL (ref 32.0–36.0)
MCV: 88.7 fL (ref 81.0–101.0)
MONOS PCT: 13 %
Monocytes Absolute: 0.6 10*3/uL (ref 0.1–0.9)
Neutro Abs: 2.4 10*3/uL (ref 1.5–6.5)
Neutrophils Relative %: 53 %
PLATELETS: 173 10*3/uL (ref 145–400)
RBC: 3.9 MIL/uL (ref 3.70–5.32)
RDW: 14.9 % (ref 11.1–15.7)
WBC: 4.6 10*3/uL (ref 3.9–10.0)

## 2018-03-09 LAB — IRON AND TIBC
Iron: 60 ug/dL (ref 41–142)
Saturation Ratios: 24 % (ref 21–57)
TIBC: 253 ug/dL (ref 236–444)
UIBC: 193 ug/dL

## 2018-03-09 LAB — SAVE SMEAR

## 2018-03-09 NOTE — Progress Notes (Signed)
Hematology and Oncology Follow Up Visit  Brittany Weber [id number] March 24, 1932 84 y.o. 03/09/2018   Principle Diagnosis:  Anemia of chronic renal insufficiency Intermittent iron deficiency anemia  Current Therapy:   Aranesp 300 mcg subcutaneous as needed for hemoglobin less than 11 IV iron as indicated   Interim History:  Brittany Weber is here today for follow-up. She is still having pain in her left shoulder and hips due to arthritis. She plans to follow-up with her PCP for further work up.  She has intermittent fatigue. She has occasional SOB with over exertion and will take breaks to rest as needed. Hgb is 11.1, MCV 88, WBC count is 4.6 and Platelet count 173.  No episodes of bleeding, no bruising or petechiae. No lymphadenopathy found on exam.  No fever, chills, n/v, cough, rash, dizziness, chest pain, palpitations, abdominal pain or changes in bowel or bladder habits.  No swelling, numbness or tingling in her extremities.  She has maintained a good appetite and is staying well hydrated. Her weight is stable.   ECOG Performance Status: 1 - Symptomatic but completely ambulatory  Medications:  Allergies as of 03/09/2018      Reactions   Amlodipine    Amoxicillin    Ampicillin    Ascorbic Acid    Aspirin    Calcium Carbonate    Cholecalciferol    Cilostazol    Clarithromycin    Clarithromycin    Codeine    Darvocet [propoxyphene N-acetaminophen]    Demerol    Dexamethasone    Diazepam    Diclofenac    Emetrol    Erythromycin    Erythromycin Base    Folic Acid    Indomethacin    Indomethacin    Iron    Pt states she is "allergic to iron pills"- no anaphylaxis to Iron. Tolerates Feraheme IV   Latex    Lisinopril    Meperidine    Oxycodone    Sulfa Antibiotics    Sulfonamide Derivatives    Valium       Medication List        Accurate as of 03/09/18 11:55 AM. Always use your most recent med list.          amLODipine-olmesartan 10-40 MG tablet Commonly  known as:  AZOR amlodipine 10 mg-olmesartan 40 mg tablet   amLODipine-olmesartan 10-40 MG tablet Commonly known as:  AZOR   calcium carbonate 1250 (500 Ca) MG chewable tablet Commonly known as:  OS-CAL Chew 1 tablet by mouth daily. Pt takes Calcrate   cilostazol 100 MG tablet Commonly known as:  PLETAL Take 100 mg by mouth 2 (two) times daily.   dexamethasone 4 MG tablet Commonly known as:  DECADRON Take 4 mg by mouth as needed (for pain).   folic acid 1 MG tablet Commonly known as:  FOLVITE TAKE 1 TABLET BY MOUTH TWICE DAILY.   losartan 100 MG tablet Commonly known as:  COZAAR Take 100 mg by mouth daily.   meloxicam 7.5 MG tablet Commonly known as:  MOBIC meloxicam 7.5 mg tablet   NON FORMULARY Take by mouth 3 (three) times a week. Cleanse-more   PAPAYA ENZYMES PO Take by mouth 3 (three) times daily after meals.   sulindac 200 MG tablet Commonly known as:  CLINORIL sulindac 200 mg tablet   vitamin C 500 MG tablet Commonly known as:  ASCORBIC ACID Take 500 mg by mouth daily. Take 2 po   VITAMIN D-3 PO Take by mouth every morning.  VOLTAREN 1 % Gel Generic drug:  diclofenac sodium Voltaren 1 % topical gel  APPLY 4 GRAMS TO AFFECTED AREAS 3 TIMES A DAY AS DIRECTED.   VOLTAREN 1 % Gel Generic drug:  diclofenac sodium Apply topically 4 (four) times daily.   WHITE WILLOW BARK PO Take by mouth daily. " Pt states that white willow bark is natural aspirin "       Allergies:  Allergies  Allergen Reactions  . Amlodipine   . Amoxicillin   . Ampicillin   . Ascorbic Acid   . Aspirin   . Calcium Carbonate   . Cholecalciferol   . Cilostazol   . Clarithromycin   . Clarithromycin   . Codeine   . Darvocet [Propoxyphene N-Acetaminophen]   . Demerol   . Dexamethasone   . Diazepam   . Diclofenac   . Emetrol   . Erythromycin   . Erythromycin Base   . Folic Acid   . Indomethacin   . Indomethacin   . Iron     Pt states she is "allergic to iron pills"-  no anaphylaxis to Iron. Tolerates Feraheme IV  . Latex   . Lisinopril   . Meperidine   . Oxycodone   . Sulfa Antibiotics   . Sulfonamide Derivatives   . Valium     Past Medical History, Surgical history, Social history, and Family History were reviewed and updated.  Review of Systems: All other 10 point review of systems is negative.   Physical Exam:  vitals were not taken for this visit.   Wt Readings from Last 3 Encounters:  09/07/17 158 lb (71.7 kg)  03/08/17 163 lb (73.9 kg)  01/27/17 162 lb (73.5 kg)    Ocular: Sclerae unicteric, pupils equal, round and reactive to light Ear-nose-throat: Oropharynx clear, dentition fair Lymphatic: No cervical, supraclavicular or axillary adenopathy Lungs no rales or rhonchi, good excursion bilaterally Heart regular rate and rhythm, no murmur appreciated Abd soft, nontender, positive bowel sounds, no liver or spleen tip palpated on exam, no fluid wave  MSK no focal spinal tenderness, no joint edema Neuro: non-focal, well-oriented, appropriate affect Breasts: Deferred    Lab Results  Component Value Date   WBC 4.6 03/09/2018   HGB 11.4 (L) 09/07/2017   HCT 34.6 (L) 03/09/2018   MCV 88.7 03/09/2018   PLT 173 03/09/2018   Lab Results  Component Value Date   FERRITIN 1,002 (H) 09/07/2017   IRON 61 09/07/2017   TIBC 259 09/07/2017   UIBC 197 09/07/2017   IRONPCTSAT 24 09/07/2017   Lab Results  Component Value Date   RETICCTPCT 1.4 09/25/2015   RBC 3.90 03/09/2018   RETICCTABS 53.6 09/25/2015   No results found for: KPAFRELGTCHN, LAMBDASER, KAPLAMBRATIO No results found for: IGGSERUM, IGA, IGMSERUM No results found for: Georgann Housekeeper, MSPIKE, SPEI   Chemistry      Component Value Date/Time   NA 146 (H) 09/07/2017 1140   NA 144 03/13/2016 1205   K 3.6 09/07/2017 1140   K 3.6 03/13/2016 1205   CL 108 09/07/2017 1140   CO2 30 09/07/2017 1140   CO2 24 03/13/2016 1205   BUN 20  09/07/2017 1140   BUN 18.8 03/13/2016 1205   CREATININE 1.2 09/07/2017 1140   CREATININE 1.4 (H) 03/13/2016 1205      Component Value Date/Time   CALCIUM 10.1 09/07/2017 1140   CALCIUM 9.9 03/13/2016 1205   ALKPHOS 52 09/07/2017 1140   ALKPHOS 50 03/13/2016 1205  AST 23 09/07/2017 1140   AST 18 03/13/2016 1205   ALT 22 09/07/2017 1140   ALT 16 03/13/2016 1205   BILITOT 0.50 09/07/2017 1140   BILITOT 0.31 03/13/2016 1205      Impression and Plan: [name] is a very pleasant 84 yo African American female with multifactorial anemia, iron deficiency and anemia of chronic renal insufficiency. Hgb is stable at 11.1 so no Aranesp needed this visit.  She has had some fatigue.  We will see what her iron studies show and bring her back in for infusion if need be.  We will plan to see her back in another 6 months. She will contact our office with any questions or concerns. We can certainly see her sooner if need be.    Emeline GinsSarah [place], NP 4/3/201911:55 AM

## 2018-03-31 ENCOUNTER — Other Ambulatory Visit: Payer: Self-pay | Admitting: Hematology & Oncology

## 2018-03-31 DIAGNOSIS — D529 Folate deficiency anemia, unspecified: Secondary | ICD-10-CM

## 2018-04-08 DIAGNOSIS — Z0000 Encounter for general adult medical examination without abnormal findings: Secondary | ICD-10-CM | POA: Diagnosis not present

## 2018-04-08 DIAGNOSIS — M159 Polyosteoarthritis, unspecified: Secondary | ICD-10-CM | POA: Diagnosis not present

## 2018-04-14 DIAGNOSIS — R2689 Other abnormalities of gait and mobility: Secondary | ICD-10-CM | POA: Diagnosis not present

## 2018-04-14 DIAGNOSIS — M150 Primary generalized (osteo)arthritis: Secondary | ICD-10-CM | POA: Diagnosis not present

## 2018-04-14 DIAGNOSIS — D649 Anemia, unspecified: Secondary | ICD-10-CM | POA: Diagnosis not present

## 2018-04-14 DIAGNOSIS — M6281 Muscle weakness (generalized): Secondary | ICD-10-CM | POA: Diagnosis not present

## 2018-04-14 DIAGNOSIS — I10 Essential (primary) hypertension: Secondary | ICD-10-CM | POA: Diagnosis not present

## 2018-04-14 DIAGNOSIS — I739 Peripheral vascular disease, unspecified: Secondary | ICD-10-CM | POA: Diagnosis not present

## 2018-04-15 ENCOUNTER — Ambulatory Visit: Payer: Medicare Other | Admitting: Podiatry

## 2018-04-18 DIAGNOSIS — M6281 Muscle weakness (generalized): Secondary | ICD-10-CM | POA: Diagnosis not present

## 2018-04-18 DIAGNOSIS — I10 Essential (primary) hypertension: Secondary | ICD-10-CM | POA: Diagnosis not present

## 2018-04-18 DIAGNOSIS — R2689 Other abnormalities of gait and mobility: Secondary | ICD-10-CM | POA: Diagnosis not present

## 2018-04-18 DIAGNOSIS — D649 Anemia, unspecified: Secondary | ICD-10-CM | POA: Diagnosis not present

## 2018-04-18 DIAGNOSIS — I739 Peripheral vascular disease, unspecified: Secondary | ICD-10-CM | POA: Diagnosis not present

## 2018-04-18 DIAGNOSIS — M150 Primary generalized (osteo)arthritis: Secondary | ICD-10-CM | POA: Diagnosis not present

## 2018-04-19 DIAGNOSIS — I739 Peripheral vascular disease, unspecified: Secondary | ICD-10-CM | POA: Diagnosis not present

## 2018-04-19 DIAGNOSIS — D649 Anemia, unspecified: Secondary | ICD-10-CM | POA: Diagnosis not present

## 2018-04-19 DIAGNOSIS — R2689 Other abnormalities of gait and mobility: Secondary | ICD-10-CM | POA: Diagnosis not present

## 2018-04-19 DIAGNOSIS — I10 Essential (primary) hypertension: Secondary | ICD-10-CM | POA: Diagnosis not present

## 2018-04-19 DIAGNOSIS — M150 Primary generalized (osteo)arthritis: Secondary | ICD-10-CM | POA: Diagnosis not present

## 2018-04-19 DIAGNOSIS — M6281 Muscle weakness (generalized): Secondary | ICD-10-CM | POA: Diagnosis not present

## 2018-04-21 DIAGNOSIS — M6281 Muscle weakness (generalized): Secondary | ICD-10-CM | POA: Diagnosis not present

## 2018-04-21 DIAGNOSIS — I10 Essential (primary) hypertension: Secondary | ICD-10-CM | POA: Diagnosis not present

## 2018-04-21 DIAGNOSIS — M150 Primary generalized (osteo)arthritis: Secondary | ICD-10-CM | POA: Diagnosis not present

## 2018-04-21 DIAGNOSIS — I739 Peripheral vascular disease, unspecified: Secondary | ICD-10-CM | POA: Diagnosis not present

## 2018-04-21 DIAGNOSIS — R2689 Other abnormalities of gait and mobility: Secondary | ICD-10-CM | POA: Diagnosis not present

## 2018-04-21 DIAGNOSIS — D649 Anemia, unspecified: Secondary | ICD-10-CM | POA: Diagnosis not present

## 2018-04-22 ENCOUNTER — Ambulatory Visit: Payer: Medicare Other | Admitting: Podiatry

## 2018-04-25 DIAGNOSIS — I10 Essential (primary) hypertension: Secondary | ICD-10-CM | POA: Diagnosis not present

## 2018-04-25 DIAGNOSIS — D649 Anemia, unspecified: Secondary | ICD-10-CM | POA: Diagnosis not present

## 2018-04-25 DIAGNOSIS — R2689 Other abnormalities of gait and mobility: Secondary | ICD-10-CM | POA: Diagnosis not present

## 2018-04-25 DIAGNOSIS — M150 Primary generalized (osteo)arthritis: Secondary | ICD-10-CM | POA: Diagnosis not present

## 2018-04-25 DIAGNOSIS — I739 Peripheral vascular disease, unspecified: Secondary | ICD-10-CM | POA: Diagnosis not present

## 2018-04-25 DIAGNOSIS — M6281 Muscle weakness (generalized): Secondary | ICD-10-CM | POA: Diagnosis not present

## 2018-04-27 DIAGNOSIS — M150 Primary generalized (osteo)arthritis: Secondary | ICD-10-CM | POA: Diagnosis not present

## 2018-04-27 DIAGNOSIS — I10 Essential (primary) hypertension: Secondary | ICD-10-CM | POA: Diagnosis not present

## 2018-04-27 DIAGNOSIS — I739 Peripheral vascular disease, unspecified: Secondary | ICD-10-CM | POA: Diagnosis not present

## 2018-04-27 DIAGNOSIS — R2689 Other abnormalities of gait and mobility: Secondary | ICD-10-CM | POA: Diagnosis not present

## 2018-04-27 DIAGNOSIS — D649 Anemia, unspecified: Secondary | ICD-10-CM | POA: Diagnosis not present

## 2018-04-27 DIAGNOSIS — M6281 Muscle weakness (generalized): Secondary | ICD-10-CM | POA: Diagnosis not present

## 2018-04-28 DIAGNOSIS — M150 Primary generalized (osteo)arthritis: Secondary | ICD-10-CM | POA: Diagnosis not present

## 2018-04-28 DIAGNOSIS — R2689 Other abnormalities of gait and mobility: Secondary | ICD-10-CM | POA: Diagnosis not present

## 2018-04-28 DIAGNOSIS — D649 Anemia, unspecified: Secondary | ICD-10-CM | POA: Diagnosis not present

## 2018-04-28 DIAGNOSIS — M6281 Muscle weakness (generalized): Secondary | ICD-10-CM | POA: Diagnosis not present

## 2018-04-28 DIAGNOSIS — I10 Essential (primary) hypertension: Secondary | ICD-10-CM | POA: Diagnosis not present

## 2018-04-28 DIAGNOSIS — I739 Peripheral vascular disease, unspecified: Secondary | ICD-10-CM | POA: Diagnosis not present

## 2018-05-04 DIAGNOSIS — I10 Essential (primary) hypertension: Secondary | ICD-10-CM | POA: Diagnosis not present

## 2018-05-04 DIAGNOSIS — D649 Anemia, unspecified: Secondary | ICD-10-CM | POA: Diagnosis not present

## 2018-05-04 DIAGNOSIS — R2689 Other abnormalities of gait and mobility: Secondary | ICD-10-CM | POA: Diagnosis not present

## 2018-05-04 DIAGNOSIS — I739 Peripheral vascular disease, unspecified: Secondary | ICD-10-CM | POA: Diagnosis not present

## 2018-05-04 DIAGNOSIS — M6281 Muscle weakness (generalized): Secondary | ICD-10-CM | POA: Diagnosis not present

## 2018-05-04 DIAGNOSIS — M150 Primary generalized (osteo)arthritis: Secondary | ICD-10-CM | POA: Diagnosis not present

## 2018-05-06 DIAGNOSIS — I739 Peripheral vascular disease, unspecified: Secondary | ICD-10-CM | POA: Diagnosis not present

## 2018-05-06 DIAGNOSIS — R2689 Other abnormalities of gait and mobility: Secondary | ICD-10-CM | POA: Diagnosis not present

## 2018-05-06 DIAGNOSIS — D649 Anemia, unspecified: Secondary | ICD-10-CM | POA: Diagnosis not present

## 2018-05-06 DIAGNOSIS — I10 Essential (primary) hypertension: Secondary | ICD-10-CM | POA: Diagnosis not present

## 2018-05-06 DIAGNOSIS — M150 Primary generalized (osteo)arthritis: Secondary | ICD-10-CM | POA: Diagnosis not present

## 2018-05-06 DIAGNOSIS — M6281 Muscle weakness (generalized): Secondary | ICD-10-CM | POA: Diagnosis not present

## 2018-05-09 DIAGNOSIS — D649 Anemia, unspecified: Secondary | ICD-10-CM | POA: Diagnosis not present

## 2018-05-09 DIAGNOSIS — R2689 Other abnormalities of gait and mobility: Secondary | ICD-10-CM | POA: Diagnosis not present

## 2018-05-09 DIAGNOSIS — M150 Primary generalized (osteo)arthritis: Secondary | ICD-10-CM | POA: Diagnosis not present

## 2018-05-09 DIAGNOSIS — M6281 Muscle weakness (generalized): Secondary | ICD-10-CM | POA: Diagnosis not present

## 2018-05-09 DIAGNOSIS — I10 Essential (primary) hypertension: Secondary | ICD-10-CM | POA: Diagnosis not present

## 2018-05-09 DIAGNOSIS — I739 Peripheral vascular disease, unspecified: Secondary | ICD-10-CM | POA: Diagnosis not present

## 2018-05-10 DIAGNOSIS — I10 Essential (primary) hypertension: Secondary | ICD-10-CM | POA: Diagnosis not present

## 2018-05-10 DIAGNOSIS — M6281 Muscle weakness (generalized): Secondary | ICD-10-CM | POA: Diagnosis not present

## 2018-05-10 DIAGNOSIS — M150 Primary generalized (osteo)arthritis: Secondary | ICD-10-CM | POA: Diagnosis not present

## 2018-05-10 DIAGNOSIS — R2689 Other abnormalities of gait and mobility: Secondary | ICD-10-CM | POA: Diagnosis not present

## 2018-05-10 DIAGNOSIS — D649 Anemia, unspecified: Secondary | ICD-10-CM | POA: Diagnosis not present

## 2018-05-10 DIAGNOSIS — I739 Peripheral vascular disease, unspecified: Secondary | ICD-10-CM | POA: Diagnosis not present

## 2018-05-11 DIAGNOSIS — D649 Anemia, unspecified: Secondary | ICD-10-CM | POA: Diagnosis not present

## 2018-05-11 DIAGNOSIS — M150 Primary generalized (osteo)arthritis: Secondary | ICD-10-CM | POA: Diagnosis not present

## 2018-05-11 DIAGNOSIS — M6281 Muscle weakness (generalized): Secondary | ICD-10-CM | POA: Diagnosis not present

## 2018-05-11 DIAGNOSIS — R2689 Other abnormalities of gait and mobility: Secondary | ICD-10-CM | POA: Diagnosis not present

## 2018-05-11 DIAGNOSIS — I739 Peripheral vascular disease, unspecified: Secondary | ICD-10-CM | POA: Diagnosis not present

## 2018-05-11 DIAGNOSIS — I10 Essential (primary) hypertension: Secondary | ICD-10-CM | POA: Diagnosis not present

## 2018-05-13 ENCOUNTER — Ambulatory Visit: Payer: Medicare Other | Admitting: Podiatry

## 2018-05-17 DIAGNOSIS — I739 Peripheral vascular disease, unspecified: Secondary | ICD-10-CM | POA: Diagnosis not present

## 2018-05-17 DIAGNOSIS — I10 Essential (primary) hypertension: Secondary | ICD-10-CM | POA: Diagnosis not present

## 2018-05-17 DIAGNOSIS — M150 Primary generalized (osteo)arthritis: Secondary | ICD-10-CM | POA: Diagnosis not present

## 2018-05-17 DIAGNOSIS — R2689 Other abnormalities of gait and mobility: Secondary | ICD-10-CM | POA: Diagnosis not present

## 2018-05-17 DIAGNOSIS — D649 Anemia, unspecified: Secondary | ICD-10-CM | POA: Diagnosis not present

## 2018-05-17 DIAGNOSIS — M6281 Muscle weakness (generalized): Secondary | ICD-10-CM | POA: Diagnosis not present

## 2018-05-27 ENCOUNTER — Encounter: Payer: Self-pay | Admitting: Podiatry

## 2018-05-27 ENCOUNTER — Ambulatory Visit (INDEPENDENT_AMBULATORY_CARE_PROVIDER_SITE_OTHER): Payer: Medicare Other | Admitting: Podiatry

## 2018-05-27 DIAGNOSIS — B351 Tinea unguium: Secondary | ICD-10-CM | POA: Diagnosis not present

## 2018-05-27 DIAGNOSIS — M79676 Pain in unspecified toe(s): Secondary | ICD-10-CM | POA: Diagnosis not present

## 2018-06-01 NOTE — Progress Notes (Signed)
Subjective:   Patient ID: Brittany Weber, female   DOB: 84 y.o.   MRN: [id number]   HPI Patient presents with thick toenails 1-5 both feet that are dystrophic and she cannot cut   ROS      Objective:  Physical Exam  Neurovascular status unchanged with thick yellow brittle nailbeds 1-5 both feet that are painful     Assessment:  Mycotic nail infection with pain 1-5 both feet     Plan:  Debride painful nailbeds 1-5 both feet with no iatrogenic bleeding noted

## 2018-06-15 DIAGNOSIS — E559 Vitamin D deficiency, unspecified: Secondary | ICD-10-CM | POA: Diagnosis not present

## 2018-06-15 DIAGNOSIS — D649 Anemia, unspecified: Secondary | ICD-10-CM | POA: Diagnosis not present

## 2018-06-15 DIAGNOSIS — I10 Essential (primary) hypertension: Secondary | ICD-10-CM | POA: Diagnosis not present

## 2018-06-15 DIAGNOSIS — I208 Other forms of angina pectoris: Secondary | ICD-10-CM | POA: Diagnosis not present

## 2018-06-15 DIAGNOSIS — N189 Chronic kidney disease, unspecified: Secondary | ICD-10-CM | POA: Diagnosis not present

## 2018-06-15 DIAGNOSIS — E785 Hyperlipidemia, unspecified: Secondary | ICD-10-CM | POA: Diagnosis not present

## 2018-06-15 DIAGNOSIS — M1990 Unspecified osteoarthritis, unspecified site: Secondary | ICD-10-CM | POA: Diagnosis not present

## 2018-06-15 DIAGNOSIS — G44209 Tension-type headache, unspecified, not intractable: Secondary | ICD-10-CM | POA: Diagnosis not present

## 2018-07-27 ENCOUNTER — Ambulatory Visit: Payer: Medicare Other | Admitting: Podiatry

## 2018-08-11 ENCOUNTER — Other Ambulatory Visit: Payer: Self-pay | Admitting: Hematology & Oncology

## 2018-08-11 DIAGNOSIS — D529 Folate deficiency anemia, unspecified: Secondary | ICD-10-CM

## 2018-09-07 ENCOUNTER — Other Ambulatory Visit: Payer: Medicare Other

## 2018-09-07 ENCOUNTER — Ambulatory Visit: Payer: Medicare Other | Admitting: Family

## 2018-09-09 ENCOUNTER — Ambulatory Visit (INDEPENDENT_AMBULATORY_CARE_PROVIDER_SITE_OTHER): Payer: Medicare Other | Admitting: Podiatry

## 2018-09-09 ENCOUNTER — Encounter: Payer: Self-pay | Admitting: Podiatry

## 2018-09-09 DIAGNOSIS — B351 Tinea unguium: Secondary | ICD-10-CM | POA: Diagnosis not present

## 2018-09-09 DIAGNOSIS — D689 Coagulation defect, unspecified: Secondary | ICD-10-CM

## 2018-09-09 DIAGNOSIS — M79676 Pain in unspecified toe(s): Secondary | ICD-10-CM | POA: Diagnosis not present

## 2018-09-09 NOTE — Progress Notes (Signed)
Patient ID: Brittany Weber, female   DOB: October 25, 1932, 84 y.o.   MRN: [id number] Complaint:  Visit Type: Patient returns to my office for continued preventative foot care services. Complaint: Patient states" my nails have grown long and thick and become painful to walk and wear shoes" . The patient presents for preventative foot care services. No changes to ROS.  Patient is taking pletal.  Podiatric Exam: Vascular: dorsalis pedis and posterior tibial pulses are palpable bilateral. Capillary return is immediate. Temperature gradient is WNL. Skin turgor WNL  Sensorium: Normal Semmes Weinstein monofilament test. Normal tactile sensation bilaterally. Nail Exam: Pt has thick disfigured discolored nails with subungual debris noted bilateral entire nail hallux through fifth toenails Ulcer Exam: There is no evidence of ulcer or pre-ulcerative changes or infection. Orthopedic Exam: Muscle tone and strength are WNL. No limitations in general ROM. No crepitus or effusions noted. Hammer toes  B/L.  Bony prominences are unremarkable.Midfoot arthritis B/L .Skin: No Porokeratosis. No infection or ulcers  Diagnosis:  Onychomycosis, , Pain in right toe, pain in left toes  Treatment & Plan Procedures and Treatment: Consent by patient was obtained for treatment procedures. The patient understood the discussion of treatment and procedures well. All questions were answered thoroughly reviewed. Debridement of mycotic and hypertrophic toenails, 1 through 5 bilateral and clearing of subungual debris. No ulceration, no infection noted.  Return Visit-Office Procedure: Patient instructed to return to the office for a follow up visit 10 weeks  for continued evaluation and treatment.  [name] DPM

## 2018-09-14 ENCOUNTER — Other Ambulatory Visit: Payer: Self-pay | Admitting: Hematology & Oncology

## 2018-09-14 DIAGNOSIS — D529 Folate deficiency anemia, unspecified: Secondary | ICD-10-CM

## 2018-09-21 ENCOUNTER — Inpatient Hospital Stay: Payer: Medicare Other | Attending: Hematology & Oncology

## 2018-09-21 ENCOUNTER — Inpatient Hospital Stay: Payer: Medicare Other | Admitting: Family

## 2018-09-26 DIAGNOSIS — E785 Hyperlipidemia, unspecified: Secondary | ICD-10-CM | POA: Diagnosis not present

## 2018-09-26 DIAGNOSIS — E559 Vitamin D deficiency, unspecified: Secondary | ICD-10-CM | POA: Diagnosis not present

## 2018-09-26 DIAGNOSIS — I10 Essential (primary) hypertension: Secondary | ICD-10-CM | POA: Diagnosis not present

## 2018-09-26 DIAGNOSIS — I208 Other forms of angina pectoris: Secondary | ICD-10-CM | POA: Diagnosis not present

## 2018-09-26 DIAGNOSIS — D649 Anemia, unspecified: Secondary | ICD-10-CM | POA: Diagnosis not present

## 2018-09-26 DIAGNOSIS — M1990 Unspecified osteoarthritis, unspecified site: Secondary | ICD-10-CM | POA: Diagnosis not present

## 2018-09-26 DIAGNOSIS — N189 Chronic kidney disease, unspecified: Secondary | ICD-10-CM | POA: Diagnosis not present

## 2018-10-05 ENCOUNTER — Ambulatory Visit: Payer: Medicare Other | Admitting: Family

## 2018-10-05 ENCOUNTER — Other Ambulatory Visit: Payer: Medicare Other

## 2018-10-12 ENCOUNTER — Other Ambulatory Visit: Payer: Self-pay | Admitting: Hematology

## 2018-10-12 DIAGNOSIS — D631 Anemia in chronic kidney disease: Secondary | ICD-10-CM

## 2018-10-12 DIAGNOSIS — N189 Chronic kidney disease, unspecified: Principal | ICD-10-CM

## 2018-10-12 DIAGNOSIS — D508 Other iron deficiency anemias: Secondary | ICD-10-CM

## 2018-10-13 ENCOUNTER — Other Ambulatory Visit: Payer: Medicare Other | Admitting: Hematology

## 2018-10-13 ENCOUNTER — Telehealth: Payer: Self-pay | Admitting: Hematology

## 2018-10-13 ENCOUNTER — Inpatient Hospital Stay: Payer: Medicare Other

## 2018-10-13 ENCOUNTER — Inpatient Hospital Stay: Payer: Medicare Other | Admitting: Hematology

## 2018-10-13 NOTE — Telephone Encounter (Signed)
Pt called to resch todays appts due to transportation issues. Pt is resched for 11/14 at 1045 am

## 2018-10-13 NOTE — Telephone Encounter (Signed)
Rescheduled pt 11/14 appt to 11/22 at 11 am because transportation service was unable to bring her on the 11/14 appt date

## 2018-10-20 ENCOUNTER — Ambulatory Visit: Payer: Medicare Other | Admitting: Hematology

## 2018-10-20 ENCOUNTER — Other Ambulatory Visit: Payer: Medicare Other

## 2018-10-22 ENCOUNTER — Other Ambulatory Visit: Payer: Self-pay | Admitting: Hematology & Oncology

## 2018-10-22 DIAGNOSIS — D529 Folate deficiency anemia, unspecified: Secondary | ICD-10-CM

## 2018-10-27 NOTE — Progress Notes (Signed)
[facility] OFFICE PROGRESS NOTE  Patient Care Team: [name], NP as PCP - General  Principle Diagnosis:  Anemia ofchronicrenal insufficiency Intermittent iron deficiency anemia  Current Therapy:        Aranesp 300 mcg subcutaneous as needed for hemoglobin less than 10 (last in 12/2015) IV iron as indicated (last in 07/2013)  ASSESSMENT & PLAN:  Anemia of chronic disease -Hgb 11.7 today -Iron profile in 03/2018 showed elevated ferritin but otherwise normal iron profile, consistent w/ anemia of chronic disease  -Patient denies any significant changes in her symptoms related to anemia; no symptoms of bleeding  -In the absence of significant changes in Hgb, we will hold off administering Aranesp, esp given its thrombotic risk  -No indication for iron infusion today   Stage III CKD -Cr stable ~1.2; renal function pending today  -I counseled the patient on avoiding nephrotoxic medications, including NSAIDs -I will defer further management to PCP   Right shoulder soreness  -Exam unremarkable; normal ROM  -Likely due to muscle strain -Recommend conservative management, including heat packs and PRN Tylenol  -If her symptoms persist or worsen, I advised her to follow up with her PCP for further management   Orders Placed This Encounter  Procedures  . CBC with Differential (Cancer Center Only)    Standing Status:   Future    Standing Expiration Date:   12/02/2019  . CMP (Cancer Center only)    Standing Status:   Future    Standing Expiration Date:   12/02/2019  . Ferritin    Standing Status:   Future    Standing Expiration Date:   12/02/2019  . Iron and TIBC    Standing Status:   Future    Standing Expiration Date:   12/02/2019   All questions were answered. The patient knows to call the clinic with any problems, questions or concerns. No barriers to learning was detected.  A total of more than 15 minutes were spent face-to-face with the patient  during this encounter and over half of that time was spent on counseling and coordination of care as outlined above.   Return in 6 months for labs and follow-up with NP [facility].   Arthur Holms, MD 10/28/2018 12:00 PM  CHIEF COMPLAINT: "I am here for my blood check "  INTERVAL HISTORY: Ms. Ortloff returns to clinic for follow-up of anemia chronic disease.  She reports that she had tried to pull on her curtain blinds yesterday, and woke up this morning with mild soreness in the right shoulder.  She is able to move her right arm without any difficulty.  She denies any weakness or sensation change in her right hand.  She denies any abnormal bleeding or bruising, such as hematuria, hematochezia, or melena.  REVIEW OF SYSTEMS:   Constitutional: ( - ) fevers, ( - )  chills , ( - ) night sweats Eyes: ( - ) blurriness of vision, ( - ) double vision, ( - ) watery eyes Ears, nose, mouth, throat, and face: ( - ) mucositis, ( - ) sore throat Respiratory: ( - ) cough, ( - ) dyspnea, ( - ) wheezes Cardiovascular: ( - ) palpitation, ( - ) chest discomfort, ( - ) lower extremity swelling Gastrointestinal:  ( - ) nausea, ( - ) heartburn, ( - ) change in bowel habits Skin: ( - ) abnormal skin rashes Lymphatics: ( - ) new lymphadenopathy, ( - ) easy bruising Neurological: ( - ) numbness, ( - )  tingling, ( - ) new weaknesses Behavioral/Psych: ( - ) mood change, ( - ) new changes  All other systems were reviewed with the patient and are negative.  I have reviewed the past medical history, past surgical history, social history and family history with the patient and they are unchanged from previous note.  ALLERGIES:  is allergic to amoxicillin; ampicillin; aspirin; clarithromycin; codeine; darvocet [propoxyphene n-acetaminophen]; demerol; diazepam; emetrol; erythromycin; indomethacin; iron; latex; lisinopril; meperidine; oxycodone; sulfa antibiotics; sulfonamide derivatives; and valium.  MEDICATIONS:   Current Outpatient Medications  Medication Sig Dispense Refill  . amLODipine-olmesartan (AZOR) 10-40 MG tablet amlodipine 10 mg-olmesartan 40 mg tablet    . calcium carbonate (OS-CAL) 1250 MG chewable tablet Chew 1 tablet by mouth daily. Pt takes Calcrate    . Cholecalciferol (VITAMIN D-3 PO) Take by mouth every morning.    . cilostazol (PLETAL) 100 MG tablet Take 100 mg by mouth 2 (two) times daily.     Marland Kitchen dexamethasone (DECADRON) 4 MG tablet Take 4 mg by mouth as needed (for pain).    Marland Kitchen diclofenac sodium (VOLTAREN) 1 % GEL Apply topically 4 (four) times daily.    . folic acid (FOLVITE) 1 MG tablet TAKE 1 TABLET BY MOUTH TWICE DAILY. 60 tablet 0  . losartan (COZAAR) 100 MG tablet Take 100 mg by mouth daily.    . meloxicam (MOBIC) 7.5 MG tablet meloxicam 7.5 mg tablet    . Misc Natural Products (WHITE WILLOW BARK PO) Take by mouth daily. " Pt states that white willow bark is natural aspirin "    . NON FORMULARY Take by mouth 3 (three) times a week. Cleanse-more    . PAPAYA ENZYMES PO Take by mouth 3 (three) times daily after meals.    Marland Kitchen UNABLE TO FIND nitrofurantoin monohydrate/macrocrystals 100 mg capsule    . vitamin C (ASCORBIC ACID) 500 MG tablet Take 500 mg by mouth daily. Take 2 po     No current facility-administered medications for this visit.     PHYSICAL EXAMINATION: ECOG PERFORMANCE STATUS: 2 - Symptomatic, <50% confined to bed  Today's Vitals   10/28/18 1150  BP: (!) 156/62  Pulse: 68  Resp: 18  Temp: 98.3 F (36.8 C)  TempSrc: Oral  SpO2: 100%  Weight: 145 lb 12.8 oz (66.1 kg)  Height: 5' (1.524 m)  PainSc: 0-No pain   Body mass index is 28.47 kg/m.  Filed Weights   10/28/18 1150  Weight: 145 lb 12.8 oz (66.1 kg)    GENERAL: alert, no distress and comfortable, walks with a rollator  SKIN: skin color, texture, turgor are normal, no rashes or significant lesions EYES: conjunctiva are pink and non-injected, sclera clear OROPHARYNX: no exudate, no erythema;  lips, buccal mucosa, and tongue normal  NECK: supple, non-tender LYMPH:  no palpable lymphadenopathy in the cervical or axillary  LUNGS: clear to auscultation and percussion with normal breathing effort HEART: regular rate & rhythm and no murmurs and no lower extremity edema ABDOMEN: soft, non-tender, non-distended, normal bowel sounds Musculoskeletal: no cyanosis of digits and no clubbing; no swelling or bruising over the right shoulder  PSYCH: alert & oriented x 3, slightly slow but fluent speech NEURO: no focal motor/sensory deficits  LABORATORY DATA:  I have reviewed the data as listed    Component Value Date/Time   NA 143 03/09/2018 1127   NA 146 (H) 09/07/2017 1140   NA 144 03/13/2016 1205   K 3.9 03/09/2018 1127   K 3.6 09/07/2017 1140  K 3.6 03/13/2016 1205   CL 107 03/09/2018 1127   CL 108 09/07/2017 1140   CO2 26 03/09/2018 1127   CO2 30 09/07/2017 1140   CO2 24 03/13/2016 1205   GLUCOSE 85 03/09/2018 1127   GLUCOSE 91 09/07/2017 1140   BUN 17 03/09/2018 1127   BUN 20 09/07/2017 1140   BUN 18.8 03/13/2016 1205   CREATININE 1.19 (H) 03/09/2018 1127   CREATININE 1.2 09/07/2017 1140   CREATININE 1.4 (H) 03/13/2016 1205   CALCIUM 10.1 03/09/2018 1127   CALCIUM 10.1 09/07/2017 1140   CALCIUM 9.9 03/13/2016 1205   PROT 8.0 03/09/2018 1127   PROT 8.3 (H) 09/07/2017 1140   PROT 7.9 03/13/2016 1205   ALBUMIN 4.1 03/09/2018 1127   ALBUMIN 3.9 09/07/2017 1140   ALBUMIN 4.6 07/08/2016 1417   ALBUMIN 4.1 03/13/2016 1205   AST 18 03/09/2018 1127   AST 18 03/13/2016 1205   ALT 17 03/09/2018 1127   ALT 22 09/07/2017 1140   ALT 16 03/13/2016 1205   ALKPHOS 57 03/09/2018 1127   ALKPHOS 52 09/07/2017 1140   ALKPHOS 50 03/13/2016 1205   BILITOT 0.3 03/09/2018 1127   BILITOT 0.31 03/13/2016 1205   GFRNONAA 41 (L) 03/09/2018 1127   GFRAA 47 (L) 03/09/2018 1127    No results found for: SPEP, UPEP  Lab Results  Component Value Date   WBC 4.1 10/28/2018   NEUTROABS 1.9  10/28/2018   HGB 11.7 (L) 10/28/2018   HCT 38.8 10/28/2018   MCV 91.5 10/28/2018   PLT 217 10/28/2018      Chemistry      Component Value Date/Time   NA 143 03/09/2018 1127   NA 146 (H) 09/07/2017 1140   NA 144 03/13/2016 1205   K 3.9 03/09/2018 1127   K 3.6 09/07/2017 1140   K 3.6 03/13/2016 1205   CL 107 03/09/2018 1127   CL 108 09/07/2017 1140   CO2 26 03/09/2018 1127   CO2 30 09/07/2017 1140   CO2 24 03/13/2016 1205   BUN 17 03/09/2018 1127   BUN 20 09/07/2017 1140   BUN 18.8 03/13/2016 1205   CREATININE 1.19 (H) 03/09/2018 1127   CREATININE 1.2 09/07/2017 1140   CREATININE 1.4 (H) 03/13/2016 1205      Component Value Date/Time   CALCIUM 10.1 03/09/2018 1127   CALCIUM 10.1 09/07/2017 1140   CALCIUM 9.9 03/13/2016 1205   ALKPHOS 57 03/09/2018 1127   ALKPHOS 52 09/07/2017 1140   ALKPHOS 50 03/13/2016 1205   AST 18 03/09/2018 1127   AST 18 03/13/2016 1205   ALT 17 03/09/2018 1127   ALT 22 09/07/2017 1140   ALT 16 03/13/2016 1205   BILITOT 0.3 03/09/2018 1127   BILITOT 0.31 03/13/2016 1205

## 2018-10-28 ENCOUNTER — Inpatient Hospital Stay: Payer: Medicare Other | Attending: Hematology

## 2018-10-28 ENCOUNTER — Encounter: Payer: Self-pay | Admitting: Hematology

## 2018-10-28 ENCOUNTER — Inpatient Hospital Stay (HOSPITAL_BASED_OUTPATIENT_CLINIC_OR_DEPARTMENT_OTHER): Payer: Medicare Other | Admitting: Hematology

## 2018-10-28 VITALS — BP 156/62 | HR 68 | Temp 98.3°F | Resp 18 | Ht 60.0 in | Wt 145.8 lb

## 2018-10-28 DIAGNOSIS — Z791 Long term (current) use of non-steroidal anti-inflammatories (NSAID): Secondary | ICD-10-CM | POA: Diagnosis not present

## 2018-10-28 DIAGNOSIS — M25511 Pain in right shoulder: Secondary | ICD-10-CM | POA: Diagnosis not present

## 2018-10-28 DIAGNOSIS — Z79899 Other long term (current) drug therapy: Secondary | ICD-10-CM

## 2018-10-28 DIAGNOSIS — N189 Chronic kidney disease, unspecified: Secondary | ICD-10-CM

## 2018-10-28 DIAGNOSIS — Z7982 Long term (current) use of aspirin: Secondary | ICD-10-CM | POA: Insufficient documentation

## 2018-10-28 DIAGNOSIS — D631 Anemia in chronic kidney disease: Secondary | ICD-10-CM | POA: Diagnosis not present

## 2018-10-28 DIAGNOSIS — D508 Other iron deficiency anemias: Secondary | ICD-10-CM

## 2018-10-28 DIAGNOSIS — N183 Chronic kidney disease, stage 3 (moderate): Secondary | ICD-10-CM | POA: Diagnosis not present

## 2018-10-28 LAB — CMP (CANCER CENTER ONLY)
ALBUMIN: 4.5 g/dL (ref 3.5–5.0)
ALK PHOS: 56 U/L (ref 38–126)
ALT: 12 U/L (ref 0–44)
ANION GAP: 10 (ref 5–15)
AST: 16 U/L (ref 15–41)
BUN: 11 mg/dL (ref 8–23)
CALCIUM: 10 mg/dL (ref 8.9–10.3)
CO2: 26 mmol/L (ref 22–32)
Chloride: 109 mmol/L (ref 98–111)
Creatinine: 1.17 mg/dL — ABNORMAL HIGH (ref 0.44–1.00)
GFR, EST AFRICAN AMERICAN: 48 mL/min — AB (ref >60)
GFR, Estimated: 42 mL/min — ABNORMAL LOW (ref >60)
GLUCOSE: 89 mg/dL (ref 70–99)
POTASSIUM: 3.9 mmol/L (ref 3.5–5.1)
Sodium: 145 mmol/L (ref 135–145)
TOTAL PROTEIN: 8.1 g/dL (ref 6.5–8.1)
Total Bilirubin: 0.4 mg/dL (ref 0.3–1.2)

## 2018-10-28 LAB — CBC WITH DIFFERENTIAL (CANCER CENTER ONLY)
Abs Immature Granulocytes: 0.01 10*3/uL (ref 0.00–0.07)
Basophils Absolute: 0.1 10*3/uL (ref 0.0–0.1)
Basophils Relative: 2 %
EOS ABS: 0.1 10*3/uL (ref 0.0–0.5)
EOS PCT: 3 %
HEMATOCRIT: 38.8 % (ref 36.0–46.0)
HEMOGLOBIN: 11.7 g/dL — AB (ref 12.0–15.0)
Immature Granulocytes: 0 %
LYMPHS ABS: 1.6 10*3/uL (ref 0.7–4.0)
LYMPHS PCT: 39 %
MCH: 27.6 pg (ref 26.0–34.0)
MCHC: 30.2 g/dL (ref 30.0–36.0)
MCV: 91.5 fL (ref 80.0–100.0)
MONO ABS: 0.4 10*3/uL (ref 0.1–1.0)
Monocytes Relative: 10 %
NRBC: 0 % (ref 0.0–0.2)
Neutro Abs: 1.9 10*3/uL (ref 1.7–7.7)
Neutrophils Relative %: 46 %
Platelet Count: 217 10*3/uL (ref 150–400)
RBC: 4.24 MIL/uL (ref 3.87–5.11)
RDW: 14.6 % (ref 11.5–15.5)
WBC Count: 4.1 10*3/uL (ref 4.0–10.5)

## 2018-10-28 LAB — RETICULOCYTES
Immature Retic Fract: 3.8 % (ref 2.3–15.9)
RBC.: 4.24 MIL/uL (ref 3.87–5.11)
RETIC COUNT ABSOLUTE: 39 10*3/uL (ref 19.0–186.0)
Retic Ct Pct: 0.9 % (ref 0.4–3.1)

## 2018-10-30 LAB — SOLUBLE TRANSFERRIN RECEPTOR: Transferrin Receptor: 19.6 nmol/L (ref 12.2–27.3)

## 2018-10-31 LAB — IRON AND TIBC
Iron: 52 ug/dL (ref 41–142)
Saturation Ratios: 20 % — ABNORMAL LOW (ref 21–57)
TIBC: 255 ug/dL (ref 236–444)
UIBC: 203 ug/dL (ref 120–384)

## 2018-10-31 LAB — FERRITIN: Ferritin: 870 ng/mL — ABNORMAL HIGH (ref 11–307)

## 2018-11-01 ENCOUNTER — Telehealth: Payer: Self-pay | Admitting: Family

## 2018-11-01 NOTE — Telephone Encounter (Signed)
lmom to inform pt of iron appt 12/6 at 1230 pm per sch msg

## 2018-11-11 ENCOUNTER — Ambulatory Visit: Payer: Medicare Other

## 2018-11-17 ENCOUNTER — Inpatient Hospital Stay: Payer: Medicare Other | Attending: Hematology

## 2018-11-17 VITALS — BP 131/56 | HR 85 | Temp 98.6°F | Resp 20

## 2018-11-17 DIAGNOSIS — N183 Chronic kidney disease, stage 3 (moderate): Secondary | ICD-10-CM | POA: Diagnosis present

## 2018-11-17 DIAGNOSIS — D631 Anemia in chronic kidney disease: Secondary | ICD-10-CM | POA: Diagnosis not present

## 2018-11-17 DIAGNOSIS — N189 Chronic kidney disease, unspecified: Secondary | ICD-10-CM | POA: Diagnosis not present

## 2018-11-17 MED ORDER — SODIUM CHLORIDE 0.9 % IV SOLN
Freq: Once | INTRAVENOUS | Status: AC
  Administered 2018-11-17: 14:00:00 via INTRAVENOUS
  Filled 2018-11-17: qty 250

## 2018-11-17 MED ORDER — SODIUM CHLORIDE 0.9 % IV SOLN
510.0000 mg | Freq: Once | INTRAVENOUS | Status: AC
  Administered 2018-11-17: 510 mg via INTRAVENOUS
  Filled 2018-11-17: qty 17

## 2018-11-17 NOTE — Patient Instructions (Signed)

## 2018-11-28 DIAGNOSIS — Z1231 Encounter for screening mammogram for malignant neoplasm of breast: Secondary | ICD-10-CM | POA: Diagnosis not present

## 2018-11-28 DIAGNOSIS — Z803 Family history of malignant neoplasm of breast: Secondary | ICD-10-CM | POA: Diagnosis not present

## 2018-12-05 ENCOUNTER — Other Ambulatory Visit: Payer: Self-pay | Admitting: Hematology & Oncology

## 2018-12-05 DIAGNOSIS — D529 Folate deficiency anemia, unspecified: Secondary | ICD-10-CM

## 2018-12-09 ENCOUNTER — Ambulatory Visit: Payer: Medicare Other | Admitting: Podiatry

## 2018-12-14 ENCOUNTER — Ambulatory Visit (INDEPENDENT_AMBULATORY_CARE_PROVIDER_SITE_OTHER): Payer: Medicare Other | Admitting: Podiatry

## 2018-12-14 ENCOUNTER — Encounter: Payer: Self-pay | Admitting: Podiatry

## 2018-12-14 DIAGNOSIS — B351 Tinea unguium: Secondary | ICD-10-CM

## 2018-12-14 DIAGNOSIS — M79676 Pain in unspecified toe(s): Secondary | ICD-10-CM

## 2018-12-14 NOTE — Patient Instructions (Signed)

## 2018-12-22 DIAGNOSIS — I10 Essential (primary) hypertension: Secondary | ICD-10-CM | POA: Diagnosis not present

## 2018-12-22 DIAGNOSIS — E785 Hyperlipidemia, unspecified: Secondary | ICD-10-CM | POA: Diagnosis not present

## 2018-12-22 DIAGNOSIS — E559 Vitamin D deficiency, unspecified: Secondary | ICD-10-CM | POA: Diagnosis not present

## 2018-12-27 DIAGNOSIS — H35363 Drusen (degenerative) of macula, bilateral: Secondary | ICD-10-CM | POA: Diagnosis not present

## 2018-12-27 DIAGNOSIS — H04123 Dry eye syndrome of bilateral lacrimal glands: Secondary | ICD-10-CM | POA: Diagnosis not present

## 2018-12-27 DIAGNOSIS — H40023 Open angle with borderline findings, high risk, bilateral: Secondary | ICD-10-CM | POA: Diagnosis not present

## 2018-12-27 DIAGNOSIS — Z961 Presence of intraocular lens: Secondary | ICD-10-CM | POA: Diagnosis not present

## 2018-12-27 DIAGNOSIS — H35033 Hypertensive retinopathy, bilateral: Secondary | ICD-10-CM | POA: Diagnosis not present

## 2019-01-02 DIAGNOSIS — M1990 Unspecified osteoarthritis, unspecified site: Secondary | ICD-10-CM | POA: Diagnosis not present

## 2019-01-02 DIAGNOSIS — I10 Essential (primary) hypertension: Secondary | ICD-10-CM | POA: Diagnosis not present

## 2019-01-02 DIAGNOSIS — E559 Vitamin D deficiency, unspecified: Secondary | ICD-10-CM | POA: Diagnosis not present

## 2019-01-02 DIAGNOSIS — E785 Hyperlipidemia, unspecified: Secondary | ICD-10-CM | POA: Diagnosis not present

## 2019-01-02 DIAGNOSIS — N189 Chronic kidney disease, unspecified: Secondary | ICD-10-CM | POA: Diagnosis not present

## 2019-01-02 DIAGNOSIS — D649 Anemia, unspecified: Secondary | ICD-10-CM | POA: Diagnosis not present

## 2019-01-02 DIAGNOSIS — I208 Other forms of angina pectoris: Secondary | ICD-10-CM | POA: Diagnosis not present

## 2019-01-05 ENCOUNTER — Encounter: Payer: Self-pay | Admitting: Podiatry

## 2019-01-05 NOTE — Progress Notes (Signed)
Subjective: Brittany Weber presents today with painful, thick toenails 1-5 b/l that she cannot cut and which interfere with daily activities.  Pain is aggravated when wearing enclosed shoe gear.    She states she is using Aloe Vera lotion on her feet daily.  She voices no new problems on today's visit.  Brittany Panda, NP is her PCP.   Current Outpatient Medications:  .  amLODipine-olmesartan (AZOR) 10-40 MG tablet, amlodipine 10 mg-olmesartan 40 mg tablet, Disp: , Rfl:  .  calcium carbonate (OS-CAL) 1250 MG chewable tablet, Chew 1 tablet by mouth daily. Pt takes Calcrate, Disp: , Rfl:  .  Cholecalciferol (VITAMIN D-3 PO), Take by mouth every morning., Disp: , Rfl:  .  cilostazol (PLETAL) 100 MG tablet, Take 100 mg by mouth 2 (two) times daily. , Disp: , Rfl:  .  dexamethasone (DECADRON) 4 MG tablet, Take 4 mg by mouth as needed (for pain)., Disp: , Rfl:  .  diclofenac sodium (VOLTAREN) 1 % GEL, Apply topically 4 (four) times daily., Disp: , Rfl:  .  folic acid (FOLVITE) 1 MG tablet, TAKE 1 TABLET BY MOUTH TWICE DAILY., Disp: 60 tablet, Rfl: 0 .  losartan (COZAAR) 100 MG tablet, Take 100 mg by mouth daily., Disp: , Rfl:  .  meloxicam (MOBIC) 7.5 MG tablet, meloxicam 7.5 mg tablet, Disp: , Rfl:  .  Misc Natural Products (WHITE WILLOW BARK PO), Take by mouth daily. " Pt states that white willow bark is natural aspirin ", Disp: , Rfl:  .  NON FORMULARY, Take by mouth 3 (three) times a week. Cleanse-more, Disp: , Rfl:  .  PAPAYA ENZYMES PO, Take by mouth 3 (three) times daily after meals., Disp: , Rfl:  .  UNABLE TO FIND, nitrofurantoin monohydrate/macrocrystals 100 mg capsule, Disp: , Rfl:  .  vitamin C (ASCORBIC ACID) 500 MG tablet, Take 500 mg by mouth daily. Take 2 po, Disp: , Rfl:   Allergies  Allergen Reactions  . Amoxicillin   . Ampicillin   . Aspirin   . Clarithromycin   . Codeine   . Darvocet [Propoxyphene N-Acetaminophen]   . Demerol   . Diazepam   . Emetrol   .  Erythromycin   . Indomethacin   . Iron     Pt states she is "allergic to iron pills"- no anaphylaxis to Iron. Tolerates Feraheme IV  . Latex   . Lisinopril   . Meperidine   . Oxycodone   . Sulfa Antibiotics   . Sulfonamide Derivatives   . Valium     Objective:  Vascular Examination: Capillary refill time immediate x 10 digits  Dorsalis pedis and Posterior tibial pulses palpable b/l  Digital hair present x 10 digits  Skin temperature gradient WNL b/l  Dermatological Examination: Skin with normal turgor, texture and tone b/l  Toenails 1-5 b/l discolored, thick, dystrophic with subungual debris and pain with palpation to nailbeds due to thickness of nails.  Musculoskeletal: Muscle strength 5/5 to all LE muscle groups  Hammertoes bilaterally.  No pain, crepitus or joint limitation noted with ROM.   Neurological: Sensation intact with 10 gram monofilament.  Vibratory sensation intact.  Assessment: Painful onychomycosis toenails 1-5 b/l   Plan: 1. Toenails 1-5 b/l were debrided in length and girth without iatrogenic bleeding. 2. Continue daily moisturizer to feet. 3. Patient to continue soft, supportive shoe gear 4. Patient to report any pedal injuries to medical professional immediately. 5. Follow up 3 months. Patient/POA to call should there be a  concern in the interim.

## 2019-01-17 ENCOUNTER — Other Ambulatory Visit: Payer: Self-pay | Admitting: Hematology & Oncology

## 2019-01-17 DIAGNOSIS — D529 Folate deficiency anemia, unspecified: Secondary | ICD-10-CM

## 2019-01-26 ENCOUNTER — Other Ambulatory Visit: Payer: Self-pay | Admitting: Hematology & Oncology

## 2019-01-26 DIAGNOSIS — D529 Folate deficiency anemia, unspecified: Secondary | ICD-10-CM

## 2019-01-27 ENCOUNTER — Encounter: Payer: Self-pay | Admitting: Hematology & Oncology

## 2019-02-24 ENCOUNTER — Other Ambulatory Visit: Payer: Self-pay | Admitting: Hematology & Oncology

## 2019-02-24 DIAGNOSIS — D529 Folate deficiency anemia, unspecified: Secondary | ICD-10-CM

## 2019-02-27 ENCOUNTER — Other Ambulatory Visit: Payer: Self-pay | Admitting: Hematology & Oncology

## 2019-02-27 DIAGNOSIS — D529 Folate deficiency anemia, unspecified: Secondary | ICD-10-CM

## 2019-03-16 ENCOUNTER — Ambulatory Visit: Payer: Medicare Other | Admitting: Podiatry

## 2019-03-29 ENCOUNTER — Other Ambulatory Visit: Payer: Self-pay | Admitting: Hematology & Oncology

## 2019-03-29 ENCOUNTER — Other Ambulatory Visit: Payer: Self-pay | Admitting: *Deleted

## 2019-03-29 DIAGNOSIS — D529 Folate deficiency anemia, unspecified: Secondary | ICD-10-CM

## 2019-03-29 MED ORDER — FOLIC ACID 1 MG PO TABS: 1.0000 mg | ORAL_TABLET | Freq: Two times a day (BID) | ORAL | 2 refills | Status: DC

## 2019-04-17 ENCOUNTER — Ambulatory Visit: Payer: Medicare Other | Admitting: Podiatry

## 2019-04-18 ENCOUNTER — Ambulatory Visit: Payer: Medicare Other | Admitting: Podiatry

## 2019-04-28 ENCOUNTER — Inpatient Hospital Stay: Payer: Medicare Other | Attending: Hematology & Oncology

## 2019-04-28 ENCOUNTER — Other Ambulatory Visit: Payer: Self-pay

## 2019-04-28 ENCOUNTER — Inpatient Hospital Stay (HOSPITAL_BASED_OUTPATIENT_CLINIC_OR_DEPARTMENT_OTHER): Payer: Medicare Other | Admitting: Family

## 2019-04-28 VITALS — BP 149/64 | HR 67 | Resp 19 | Wt 150.4 lb

## 2019-04-28 DIAGNOSIS — D631 Anemia in chronic kidney disease: Secondary | ICD-10-CM | POA: Diagnosis not present

## 2019-04-28 DIAGNOSIS — D509 Iron deficiency anemia, unspecified: Secondary | ICD-10-CM

## 2019-04-28 DIAGNOSIS — N189 Chronic kidney disease, unspecified: Secondary | ICD-10-CM

## 2019-04-28 DIAGNOSIS — D508 Other iron deficiency anemias: Secondary | ICD-10-CM

## 2019-04-28 DIAGNOSIS — R5383 Other fatigue: Secondary | ICD-10-CM

## 2019-04-28 LAB — CBC WITH DIFFERENTIAL (CANCER CENTER ONLY)
Abs Immature Granulocytes: 0.02 10*3/uL (ref 0.00–0.07)
Basophils Absolute: 0 10*3/uL (ref 0.0–0.1)
Basophils Relative: 1 %
Eosinophils Absolute: 0.1 10*3/uL (ref 0.0–0.5)
Eosinophils Relative: 2 %
HCT: 37.5 % (ref 36.0–46.0)
Hemoglobin: 11.8 g/dL — ABNORMAL LOW (ref 12.0–15.0)
Immature Granulocytes: 1 %
Lymphocytes Relative: 33 %
Lymphs Abs: 1.4 10*3/uL (ref 0.7–4.0)
MCH: 28.7 pg (ref 26.0–34.0)
MCHC: 31.5 g/dL (ref 30.0–36.0)
MCV: 91.2 fL (ref 80.0–100.0)
Monocytes Absolute: 0.4 10*3/uL (ref 0.1–1.0)
Monocytes Relative: 9 %
Neutro Abs: 2.3 10*3/uL (ref 1.7–7.7)
Neutrophils Relative %: 54 %
Platelet Count: 170 10*3/uL (ref 150–400)
RBC: 4.11 MIL/uL (ref 3.87–5.11)
RDW: 13.8 % (ref 11.5–15.5)
WBC Count: 4.2 10*3/uL (ref 4.0–10.5)
nRBC: 0 % (ref 0.0–0.2)

## 2019-04-28 LAB — CMP (CANCER CENTER ONLY)
ALT: 13 U/L (ref 0–44)
AST: 16 U/L (ref 15–41)
Albumin: 4.7 g/dL (ref 3.5–5.0)
Alkaline Phosphatase: 51 U/L (ref 38–126)
Anion gap: 10 (ref 5–15)
BUN: 23 mg/dL (ref 8–23)
CO2: 25 mmol/L (ref 22–32)
Calcium: 9.8 mg/dL (ref 8.9–10.3)
Chloride: 104 mmol/L (ref 98–111)
Creatinine: 1.26 mg/dL — ABNORMAL HIGH (ref 0.44–1.00)
GFR, Est AFR Am: 45 mL/min — ABNORMAL LOW (ref >60)
GFR, Estimated: 39 mL/min — ABNORMAL LOW (ref >60)
Glucose, Bld: 93 mg/dL (ref 70–99)
Potassium: 3.7 mmol/L (ref 3.5–5.1)
Sodium: 139 mmol/L (ref 135–145)
Total Bilirubin: 0.4 mg/dL (ref 0.3–1.2)
Total Protein: 7.4 g/dL (ref 6.5–8.1)

## 2019-04-28 NOTE — Progress Notes (Signed)
Hematology and Oncology Follow Up Visit  NORELL BRISBIN [id number] 07-22-1932 85 y.o. 04/28/2019   Principle Diagnosis:  Anemia ofchronicrenal insufficiency Intermittent iron deficiency anemia  Current Therapy:   Aranesp 300 mcg subcutaneous as needed for hemoglobin less than 10 IV iron as indicated   Interim History:  Ms. Odden is here today for follow-up. She is doing well but does have fatigue at times. Hgb is stable at 11.8, MCV 91.  She has not had any episodes of bleeding. NO bruising or petechiae.  She is staying in and having her groceries brought to her. She is missing being able to go out.  No issue with infection. No fever, chills, n/v, cough, rash, dizziness, SOB, chest pain, palpitations, abdominal pain or changes in bowel or bladder habits.  No swelling, tenderness, numbness or tingling in her extremities.  No lymphadenopathy noted on exam.  She states that she has a good appetite and is staying well hydrated. Her weight is stable.   ECOG Performance Status: 1 - Symptomatic but completely ambulatory  Medications:  Allergies as of 04/28/2019      Reactions   Amoxicillin    Ampicillin    Aspirin    Clarithromycin    Codeine    Darvocet [propoxyphene N-acetaminophen]    Demerol    Diazepam    Emetrol    Erythromycin    Indomethacin    Iron    Pt states she is "allergic to iron pills"- no anaphylaxis to Iron. Tolerates Feraheme IV   Latex    Lisinopril    Meperidine    Oxycodone    Sulfa Antibiotics    Sulfonamide Derivatives    Valium       Medication List       Accurate as of Apr 28, 2019 12:32 PM. If you have any questions, ask your nurse or doctor.        amLODipine-olmesartan 10-40 MG tablet Commonly known as:  AZOR amlodipine 10 mg-olmesartan 40 mg tablet   calcium carbonate 1250 (500 Ca) MG chewable tablet Commonly known as:  OS-CAL Chew 1 tablet by mouth daily. Pt takes Calcrate   cilostazol 100 MG tablet Commonly known as:   PLETAL Take 100 mg by mouth 2 (two) times daily.   dexamethasone 4 MG tablet Commonly known as:  DECADRON Take 4 mg by mouth as needed (for pain).   folic acid 1 MG tablet Commonly known as:  FOLVITE Take 1 tablet (1 mg total) by mouth 2 (two) times daily.   losartan 100 MG tablet Commonly known as:  COZAAR Take 100 mg by mouth daily.   meloxicam 7.5 MG tablet Commonly known as:  MOBIC meloxicam 7.5 mg tablet   NON FORMULARY Take by mouth 3 (three) times a week. Cleanse-more   PAPAYA ENZYMES PO Take by mouth 3 (three) times daily after meals.   UNABLE TO FIND nitrofurantoin monohydrate/macrocrystals 100 mg capsule   vitamin C 500 MG tablet Commonly known as:  ASCORBIC ACID Take 500 mg by mouth daily. Take 2 po   VITAMIN D-3 PO Take by mouth every morning.   Voltaren 1 % Gel Generic drug:  diclofenac sodium Apply topically 4 (four) times daily.   WHITE WILLOW BARK PO Take by mouth daily. " Pt states that white willow bark is natural aspirin "       Allergies:  Allergies  Allergen Reactions  . Amoxicillin   . Ampicillin   . Aspirin   . Clarithromycin   .  Codeine   . Darvocet [Propoxyphene N-Acetaminophen]   . Demerol   . Diazepam   . Emetrol   . Erythromycin   . Indomethacin   . Iron     Pt states she is "allergic to iron pills"- no anaphylaxis to Iron. Tolerates Feraheme IV  . Latex   . Lisinopril   . Meperidine   . Oxycodone   . Sulfa Antibiotics   . Sulfonamide Derivatives   . Valium     Past Medical History, Surgical history, Social history, and Family History were reviewed and updated.  Review of Systems: All other 10 point review of systems is negative.   Physical Exam:  weight is 150 lb 6.4 oz (68.2 kg). Her blood pressure is 149/64 (abnormal) and her pulse is 67. Her respiration is 19 and oxygen saturation is 100%.   Wt Readings from Last 3 Encounters:  04/28/19 150 lb 6.4 oz (68.2 kg)  10/28/18 145 lb 12.8 oz (66.1 kg)  03/09/18  161 lb (73 kg)    Ocular: Sclerae unicteric, pupils equal, round and reactive to light Ear-nose-throat: Oropharynx clear, dentition fair Lymphatic: No cervical or supraclavicular adenopathy Lungs no rales or rhonchi, good excursion bilaterally Heart regular rate and rhythm, no murmur appreciated Abd soft, nontender, positive bowel sounds, no liver or spleen tip palpated on exam, no fluid wave  MSK no focal spinal tenderness, no joint edema Neuro: non-focal, well-oriented, appropriate affect Breasts: Deferred   Lab Results  Component Value Date   WBC 4.2 04/28/2019   HGB 11.8 (L) 04/28/2019   HCT 37.5 04/28/2019   MCV 91.2 04/28/2019   PLT 170 04/28/2019   Lab Results  Component Value Date   FERRITIN 870 (H) 10/28/2018   IRON 52 10/28/2018   TIBC 255 10/28/2018   UIBC 203 10/28/2018   IRONPCTSAT 20 (L) 10/28/2018   Lab Results  Component Value Date   RETICCTPCT 0.9 10/28/2018   RBC 4.11 04/28/2019   RETICCTABS 53.6 09/25/2015   No results found for: KPAFRELGTCHN, LAMBDASER, KAPLAMBRATIO No results found for: IGGSERUM, IGA, IGMSERUM No results found for: Marda Stalker, SPEI   Chemistry      Component Value Date/Time   NA 139 04/28/2019 1125   NA 146 (H) 09/07/2017 1140   NA 144 03/13/2016 1205   K 3.7 04/28/2019 1125   K 3.6 09/07/2017 1140   K 3.6 03/13/2016 1205   CL 104 04/28/2019 1125   CL 108 09/07/2017 1140   CO2 25 04/28/2019 1125   CO2 30 09/07/2017 1140   CO2 24 03/13/2016 1205   BUN 23 04/28/2019 1125   BUN 20 09/07/2017 1140   BUN 18.8 03/13/2016 1205   CREATININE 1.26 (H) 04/28/2019 1125   CREATININE 1.2 09/07/2017 1140   CREATININE 1.4 (H) 03/13/2016 1205      Component Value Date/Time   CALCIUM 9.8 04/28/2019 1125   CALCIUM 10.1 09/07/2017 1140   CALCIUM 9.9 03/13/2016 1205   ALKPHOS 51 04/28/2019 1125   ALKPHOS 52 09/07/2017 1140   ALKPHOS 50 03/13/2016 1205   AST 16 04/28/2019 1125    AST 18 03/13/2016 1205   ALT 13 04/28/2019 1125   ALT 22 09/07/2017 1140   ALT 16 03/13/2016 1205   BILITOT 0.4 04/28/2019 1125   BILITOT 0.31 03/13/2016 1205       Impression and Plan: Ms. Socarras is a very pleasant 85 yo African American female with multifactorial anemia, iron deficiency and anemia of chronic  renal insufficiency.  She is doing well but has occasional fatigue.  No Aranesp needed this visit with Hgb 11.8.  We will see what her iron studies show and bring her back in for infusion if needed.  We will plan to see her back in another 6 months.  She will contact our office with any questions or concerns. We can certainly see her sooner if need be.   Emeline GinsSarah [place], NP 5/22/202012:32 PM

## 2019-05-02 LAB — IRON AND TIBC
Iron: 52 ug/dL (ref 41–142)
Saturation Ratios: 22 % (ref 21–57)
TIBC: 240 ug/dL (ref 236–444)
UIBC: 188 ug/dL (ref 120–384)

## 2019-05-02 LAB — FERRITIN: Ferritin: 1163 ng/mL — ABNORMAL HIGH (ref 11–307)

## 2019-05-04 DIAGNOSIS — E559 Vitamin D deficiency, unspecified: Secondary | ICD-10-CM | POA: Diagnosis not present

## 2019-05-04 DIAGNOSIS — M1990 Unspecified osteoarthritis, unspecified site: Secondary | ICD-10-CM | POA: Diagnosis not present

## 2019-05-04 DIAGNOSIS — I10 Essential (primary) hypertension: Secondary | ICD-10-CM | POA: Diagnosis not present

## 2019-05-04 DIAGNOSIS — I208 Other forms of angina pectoris: Secondary | ICD-10-CM | POA: Diagnosis not present

## 2019-05-04 DIAGNOSIS — N189 Chronic kidney disease, unspecified: Secondary | ICD-10-CM | POA: Diagnosis not present

## 2019-05-04 DIAGNOSIS — E785 Hyperlipidemia, unspecified: Secondary | ICD-10-CM | POA: Diagnosis not present

## 2019-05-04 DIAGNOSIS — D649 Anemia, unspecified: Secondary | ICD-10-CM | POA: Diagnosis not present

## 2019-05-17 ENCOUNTER — Ambulatory Visit: Payer: Medicare Other | Admitting: Podiatry

## 2019-06-13 ENCOUNTER — Other Ambulatory Visit: Payer: Self-pay

## 2019-06-13 ENCOUNTER — Encounter: Payer: Self-pay | Admitting: Podiatry

## 2019-06-13 ENCOUNTER — Ambulatory Visit (INDEPENDENT_AMBULATORY_CARE_PROVIDER_SITE_OTHER): Payer: Medicare Other | Admitting: Podiatry

## 2019-06-13 DIAGNOSIS — I739 Peripheral vascular disease, unspecified: Secondary | ICD-10-CM

## 2019-06-13 DIAGNOSIS — D689 Coagulation defect, unspecified: Secondary | ICD-10-CM | POA: Insufficient documentation

## 2019-06-13 DIAGNOSIS — M79674 Pain in right toe(s): Secondary | ICD-10-CM

## 2019-06-13 DIAGNOSIS — M79675 Pain in left toe(s): Secondary | ICD-10-CM | POA: Diagnosis not present

## 2019-06-13 DIAGNOSIS — B351 Tinea unguium: Secondary | ICD-10-CM | POA: Diagnosis not present

## 2019-06-13 NOTE — Progress Notes (Signed)
Patient ID: Brittany Weber, female   DOB: 29-Oct-1932, 85 y.o.   MRN: [id number] Complaint:  Visit Type: Patient returns to my office for continued preventative foot care services. Complaint: Patient states" my nails have grown long and thick and become painful to walk and wear shoes" . The patient presents for preventative foot care services. No changes to ROS.  Patient is taking pletal.  Podiatric Exam: Vascular: dorsalis pedis and posterior tibial pulses are palpable bilateral. Capillary return is immediate. Temperature gradient is WNL. Skin turgor WNL  Sensorium: Normal Semmes Weinstein monofilament test. Normal tactile sensation bilaterally. Nail Exam: Pt has thick disfigured discolored nails with subungual debris noted bilateral entire nail hallux through fifth toenails Ulcer Exam: There is no evidence of ulcer or pre-ulcerative changes or infection. Orthopedic Exam: Muscle tone and strength are WNL. No limitations in general ROM. No crepitus or effusions noted. Hammer toes  B/L.  Bony prominences are unremarkable.Midfoot arthritis B/L .Skin: No Porokeratosis. No infection or ulcers  Diagnosis:  Onychomycosis, , Pain in right toe, pain in left toes  Treatment & Plan Procedures and Treatment: Consent by patient was obtained for treatment procedures. The patient understood the discussion of treatment and procedures well. All questions were answered thoroughly reviewed. Debridement of mycotic and hypertrophic toenails, 1 through 5 bilateral and clearing of subungual debris. No ulceration, no infection noted.  Return Visit-Office Procedure: Patient instructed to return to the office for a follow up visit 10 weeks  for continued evaluation and treatment.  Gardiner Barefoot DPM

## 2019-08-07 DIAGNOSIS — D649 Anemia, unspecified: Secondary | ICD-10-CM | POA: Diagnosis not present

## 2019-08-07 DIAGNOSIS — M1990 Unspecified osteoarthritis, unspecified site: Secondary | ICD-10-CM | POA: Diagnosis not present

## 2019-08-07 DIAGNOSIS — E559 Vitamin D deficiency, unspecified: Secondary | ICD-10-CM | POA: Diagnosis not present

## 2019-08-07 DIAGNOSIS — I208 Other forms of angina pectoris: Secondary | ICD-10-CM | POA: Diagnosis not present

## 2019-08-07 DIAGNOSIS — E785 Hyperlipidemia, unspecified: Secondary | ICD-10-CM | POA: Diagnosis not present

## 2019-08-07 DIAGNOSIS — N189 Chronic kidney disease, unspecified: Secondary | ICD-10-CM | POA: Diagnosis not present

## 2019-08-23 ENCOUNTER — Ambulatory Visit: Payer: Medicare Other | Admitting: Podiatry

## 2019-10-03 ENCOUNTER — Encounter: Payer: Self-pay | Admitting: Podiatry

## 2019-10-03 ENCOUNTER — Ambulatory Visit (INDEPENDENT_AMBULATORY_CARE_PROVIDER_SITE_OTHER): Payer: Medicare Other | Admitting: Podiatry

## 2019-10-03 ENCOUNTER — Other Ambulatory Visit: Payer: Self-pay

## 2019-10-03 DIAGNOSIS — M79674 Pain in right toe(s): Secondary | ICD-10-CM

## 2019-10-03 DIAGNOSIS — I739 Peripheral vascular disease, unspecified: Secondary | ICD-10-CM

## 2019-10-03 DIAGNOSIS — M79675 Pain in left toe(s): Secondary | ICD-10-CM | POA: Diagnosis not present

## 2019-10-03 DIAGNOSIS — B351 Tinea unguium: Secondary | ICD-10-CM | POA: Diagnosis not present

## 2019-10-03 DIAGNOSIS — D689 Coagulation defect, unspecified: Secondary | ICD-10-CM

## 2019-10-03 NOTE — Progress Notes (Signed)
Patient ID: Brittany Weber, female   DOB: Feb 13, 1932, 85 y.o.   MRN: [id number] Complaint:  Visit Type: Patient returns to my office for continued preventative foot care services. Complaint: Patient states" my nails have grown long and thick and become painful to walk and wear shoes" . The patient presents for preventative foot care services. No changes to ROS.  Patient is taking pletal.  Podiatric Exam: Vascular: dorsalis pedis and posterior tibial pulses are palpable bilateral. Capillary return is immediate. Temperature gradient is WNL. Skin turgor WNL  Sensorium: Normal Semmes Weinstein monofilament test. Normal tactile sensation bilaterally. Nail Exam: Pt has thick disfigured discolored nails with subungual debris noted bilateral entire nail hallux through fifth toenails Ulcer Exam: There is no evidence of ulcer or pre-ulcerative changes or infection. Orthopedic Exam: Muscle tone and strength are WNL. No limitations in general ROM. No crepitus or effusions noted. Hammer toes  B/L.  Bony prominences are unremarkable.Midfoot arthritis B/L .Skin: No Porokeratosis. No infection or ulcers  Diagnosis:  Onychomycosis, , Pain in right toe, pain in left toes  Treatment & Plan Procedures and Treatment: Consent by patient was obtained for treatment procedures. The patient understood the discussion of treatment and procedures well. All questions were answered thoroughly reviewed. Debridement of mycotic and hypertrophic toenails, 1 through 5 bilateral and clearing of subungual debris. No ulceration, no infection noted.  Return Visit-Office Procedure: Patient instructed to return to the office for a follow up visit 10 weeks  for continued evaluation and treatment.  Gardiner Barefoot DPM

## 2019-10-25 ENCOUNTER — Other Ambulatory Visit: Payer: Self-pay | Admitting: Family

## 2019-10-30 ENCOUNTER — Telehealth: Payer: Self-pay | Admitting: Hematology & Oncology

## 2019-10-30 ENCOUNTER — Ambulatory Visit: Payer: Medicare Other | Admitting: Family

## 2019-10-30 ENCOUNTER — Other Ambulatory Visit: Payer: Medicare Other

## 2019-10-30 ENCOUNTER — Ambulatory Visit: Payer: Medicare Other

## 2019-10-30 NOTE — Telephone Encounter (Signed)
Patient called 11/23 to r/s her December appointments earlier that end of December

## 2019-10-30 NOTE — Telephone Encounter (Signed)
Transportation called to reschedule her 11/24 appointments due to conflict.  Appointments moved and He will notify the patient

## 2019-10-31 ENCOUNTER — Other Ambulatory Visit: Payer: Medicare Other

## 2019-10-31 ENCOUNTER — Ambulatory Visit: Payer: Medicare Other | Admitting: Hematology & Oncology

## 2019-10-31 ENCOUNTER — Ambulatory Visit: Payer: Medicare Other

## 2019-11-14 ENCOUNTER — Telehealth: Payer: Self-pay | Admitting: Hematology & Oncology

## 2019-11-14 ENCOUNTER — Inpatient Hospital Stay: Payer: Medicare Other | Admitting: Family

## 2019-11-14 ENCOUNTER — Inpatient Hospital Stay: Payer: Medicare Other

## 2019-11-14 NOTE — Telephone Encounter (Signed)
Appts scheduled letter/calendar mailed per patient request 12/7

## 2019-11-24 ENCOUNTER — Inpatient Hospital Stay: Payer: Medicare Other | Attending: Family | Admitting: Family

## 2019-11-24 ENCOUNTER — Other Ambulatory Visit: Payer: Self-pay | Admitting: Hematology & Oncology

## 2019-11-24 ENCOUNTER — Other Ambulatory Visit: Payer: Self-pay

## 2019-11-24 ENCOUNTER — Encounter: Payer: Self-pay | Admitting: Family

## 2019-11-24 ENCOUNTER — Inpatient Hospital Stay: Payer: Medicare Other | Admitting: Family

## 2019-11-24 DIAGNOSIS — Z885 Allergy status to narcotic agent status: Secondary | ICD-10-CM | POA: Diagnosis not present

## 2019-11-24 DIAGNOSIS — E611 Iron deficiency: Secondary | ICD-10-CM | POA: Insufficient documentation

## 2019-11-24 DIAGNOSIS — N189 Chronic kidney disease, unspecified: Secondary | ICD-10-CM | POA: Insufficient documentation

## 2019-11-24 DIAGNOSIS — R5383 Other fatigue: Secondary | ICD-10-CM | POA: Diagnosis not present

## 2019-11-24 DIAGNOSIS — D631 Anemia in chronic kidney disease: Secondary | ICD-10-CM | POA: Diagnosis present

## 2019-11-24 DIAGNOSIS — D508 Other iron deficiency anemias: Secondary | ICD-10-CM | POA: Diagnosis not present

## 2019-11-24 DIAGNOSIS — Z79899 Other long term (current) drug therapy: Secondary | ICD-10-CM | POA: Diagnosis not present

## 2019-11-24 DIAGNOSIS — Z881 Allergy status to other antibiotic agents status: Secondary | ICD-10-CM | POA: Diagnosis not present

## 2019-11-24 DIAGNOSIS — Z886 Allergy status to analgesic agent status: Secondary | ICD-10-CM | POA: Insufficient documentation

## 2019-11-24 DIAGNOSIS — Z882 Allergy status to sulfonamides status: Secondary | ICD-10-CM | POA: Diagnosis not present

## 2019-11-24 DIAGNOSIS — Z880 Allergy status to penicillin: Secondary | ICD-10-CM | POA: Insufficient documentation

## 2019-11-24 DIAGNOSIS — Z888 Allergy status to other drugs, medicaments and biological substances status: Secondary | ICD-10-CM | POA: Insufficient documentation

## 2019-11-24 LAB — CMP (CANCER CENTER ONLY)
ALT: 13 U/L (ref 0–44)
AST: 17 U/L (ref 15–41)
Albumin: 4.6 g/dL (ref 3.5–5.0)
Alkaline Phosphatase: 50 U/L (ref 38–126)
Anion gap: 9 (ref 5–15)
BUN: 26 mg/dL — ABNORMAL HIGH (ref 8–23)
CO2: 28 mmol/L (ref 22–32)
Calcium: 10 mg/dL (ref 8.9–10.3)
Chloride: 107 mmol/L (ref 98–111)
Creatinine: 1.22 mg/dL — ABNORMAL HIGH (ref 0.44–1.00)
GFR, Est AFR Am: 47 mL/min — ABNORMAL LOW (ref >60)
GFR, Estimated: 40 mL/min — ABNORMAL LOW (ref >60)
Glucose, Bld: 85 mg/dL (ref 70–99)
Potassium: 3.7 mmol/L (ref 3.5–5.1)
Sodium: 144 mmol/L (ref 135–145)
Total Bilirubin: 0.5 mg/dL (ref 0.3–1.2)
Total Protein: 7.8 g/dL (ref 6.5–8.1)

## 2019-11-24 LAB — CBC WITH DIFFERENTIAL (CANCER CENTER ONLY)
Abs Immature Granulocytes: 0.02 10*3/uL (ref 0.00–0.07)
Basophils Absolute: 0 10*3/uL (ref 0.0–0.1)
Basophils Relative: 1 %
Eosinophils Absolute: 0.1 10*3/uL (ref 0.0–0.5)
Eosinophils Relative: 3 %
HCT: 35.9 % — ABNORMAL LOW (ref 36.0–46.0)
Hemoglobin: 11.3 g/dL — ABNORMAL LOW (ref 12.0–15.0)
Immature Granulocytes: 0 %
Lymphocytes Relative: 38 %
Lymphs Abs: 1.8 10*3/uL (ref 0.7–4.0)
MCH: 28.6 pg (ref 26.0–34.0)
MCHC: 31.5 g/dL (ref 30.0–36.0)
MCV: 90.9 fL (ref 80.0–100.0)
Monocytes Absolute: 0.6 10*3/uL (ref 0.1–1.0)
Monocytes Relative: 12 %
Neutro Abs: 2.2 10*3/uL (ref 1.7–7.7)
Neutrophils Relative %: 46 %
Platelet Count: 189 10*3/uL (ref 150–400)
RBC: 3.95 MIL/uL (ref 3.87–5.11)
RDW: 14.5 % (ref 11.5–15.5)
WBC Count: 4.9 10*3/uL (ref 4.0–10.5)
nRBC: 0 % (ref 0.0–0.2)

## 2019-11-24 NOTE — Progress Notes (Signed)
Hematology and Oncology Follow Up Visit  Brittany Weber [id number] 09/23/1932 85 y.o. 11/24/2019   Principle Diagnosis:  Anemia ofchronicrenal insufficiency Intermittent iron deficiency anemia  Current Therapy:   Aranesp 300 mcg subcutaneous as needed for hemoglobin less than 10 IV iron as indicated   Interim History:  Brittany Weber had a telephone visit today for follow-up. She is doing well but has had some fatigue. Her vitamin D level was low and her PCP has increased her supplement.  She saw her cardiologist yesterday and had had some twinges of pain in her breast several times last week. She states that she got a good report.  No episodes of bleeding. No bruising or petechiae.  No fever, chills, n/v, cough, rash, dizziness, SOB, chest pain, palpitations, abdominal pain or changes in bowel or bladder habits.  No swelling, tenderness, numbness or tingling in her extremities at this time.  No falls or syncope.  She has maintained a good appetite and is staying hydrated. Her weight is stable at 153 lbs.   ECOG Performance Status: 1 - Symptomatic but completely ambulatory  Medications:  Allergies as of 11/24/2019      Reactions   Amoxicillin    Ampicillin    Aspirin    Clarithromycin    Codeine    Darvocet [propoxyphene N-acetaminophen]    Demerol    Diazepam    Emetrol    Erythromycin    Indomethacin    Iron    Pt states she is "allergic to iron pills"- no anaphylaxis to Iron. Tolerates Feraheme IV   Latex    Lisinopril    Meperidine    Oxycodone    Sulfa Antibiotics    Sulfonamide Derivatives    Valium       Medication List       Accurate as of November 24, 2019  3:11 PM. If you have any questions, ask your nurse or doctor.        amLODipine-olmesartan 10-40 MG tablet Commonly known as: AZOR amlodipine 10 mg-olmesartan 40 mg tablet   calcium carbonate 1250 (500 Ca) MG chewable tablet Commonly known as: OS-CAL Chew 1 tablet by mouth daily. Pt takes  Calcrate   cilostazol 100 MG tablet Commonly known as: PLETAL Take 100 mg by mouth 2 (two) times daily.   dexamethasone 4 MG tablet Commonly known as: DECADRON Take 4 mg by mouth as needed (for pain).   folic acid 1 MG tablet Commonly known as: FOLVITE Take 1 tablet (1 mg total) by mouth 2 (two) times daily.   losartan 100 MG tablet Commonly known as: COZAAR Take 100 mg by mouth daily.   meloxicam 7.5 MG tablet Commonly known as: MOBIC meloxicam 7.5 mg tablet   metoprolol succinate 25 MG 24 hr tablet Commonly known as: TOPROL-XL   NON FORMULARY Take by mouth 3 (three) times a week. Cleanse-more   PAPAYA ENZYMES PO Take by mouth 3 (three) times daily after meals.   UNABLE TO FIND nitrofurantoin monohydrate/macrocrystals 100 mg capsule   vitamin C 500 MG tablet Commonly known as: ASCORBIC ACID Take 500 mg by mouth daily. Take 2 po   VITAMIN D-3 PO Take by mouth every morning.   Voltaren 1 % Gel Generic drug: diclofenac sodium Apply topically 4 (four) times daily.   WHITE WILLOW BARK PO Take by mouth daily. " Pt states that white willow bark is natural aspirin "       Allergies:  Allergies  Allergen Reactions  . Amoxicillin   .  Ampicillin   . Aspirin   . Clarithromycin   . Codeine   . Darvocet [Propoxyphene N-Acetaminophen]   . Demerol   . Diazepam   . Emetrol   . Erythromycin   . Indomethacin   . Iron     Pt states she is "allergic to iron pills"- no anaphylaxis to Iron. Tolerates Feraheme IV  . Latex   . Lisinopril   . Meperidine   . Oxycodone   . Sulfa Antibiotics   . Sulfonamide Derivatives   . Valium     Past Medical History, Surgical history, Social history, and Family History were reviewed and updated.  Review of Systems: All other 10 point review of systems is negative.   Physical Exam:  vitals were not taken for this visit.   Wt Readings from Last 3 Encounters:  04/28/19 150 lb 6.4 oz (68.2 kg)  10/28/18 145 lb 12.8 oz (66.1  kg)  03/09/18 161 lb (73 kg)     Lab Results  Component Value Date   WBC 4.9 11/24/2019   HGB 11.3 (L) 11/24/2019   HCT 35.9 (L) 11/24/2019   MCV 90.9 11/24/2019   PLT 189 11/24/2019   Lab Results  Component Value Date   FERRITIN 1,163 (H) 04/28/2019   IRON 52 04/28/2019   TIBC 240 04/28/2019   UIBC 188 04/28/2019   IRONPCTSAT 22 04/28/2019   Lab Results  Component Value Date   RETICCTPCT 0.9 10/28/2018   RBC 3.95 11/24/2019   RETICCTABS 53.6 09/25/2015   No results found for: KPAFRELGTCHN, LAMBDASER, KAPLAMBRATIO No results found for: IGGSERUM, IGA, IGMSERUM No results found for: TOTALPROTELP, ALBUMINELP, A1GS, A2GS, BETS, BETA2SER, GAMS, MSPIKE, SPEI   Chemistry      Component Value Date/Time   NA 139 04/28/2019 1125   NA 146 (H) 09/07/2017 1140   NA 144 03/13/2016 1205   K 3.7 04/28/2019 1125   K 3.6 09/07/2017 1140   K 3.6 03/13/2016 1205   CL 104 04/28/2019 1125   CL 108 09/07/2017 1140   CO2 25 04/28/2019 1125   CO2 30 09/07/2017 1140   CO2 24 03/13/2016 1205   BUN 23 04/28/2019 1125   BUN 20 09/07/2017 1140   BUN 18.8 03/13/2016 1205   CREATININE 1.26 (H) 04/28/2019 1125   CREATININE 1.2 09/07/2017 1140   CREATININE 1.4 (H) 03/13/2016 1205      Component Value Date/Time   CALCIUM 9.8 04/28/2019 1125   CALCIUM 10.1 09/07/2017 1140   CALCIUM 9.9 03/13/2016 1205   ALKPHOS 51 04/28/2019 1125   ALKPHOS 52 09/07/2017 1140   ALKPHOS 50 03/13/2016 1205   AST 16 04/28/2019 1125   AST 18 03/13/2016 1205   ALT 13 04/28/2019 1125   ALT 22 09/07/2017 1140   ALT 16 03/13/2016 1205   BILITOT 0.4 04/28/2019 1125   BILITOT 0.31 03/13/2016 1205       Impression and Plan: Brittany Weber is a very pleasant 85 yo African American female with multifactorial anemia, iron deficiency and anemia of chronic renal insufficiency.  Hgb remains stable at 11.3. No Aranesp needed this visit.  We will see what her iron studies show and bring her back in for infusion if  needed.  We will plan to see her back in another 6 months.  She will contact our office with any questions or concerns. We can certainly see her sooner if needed.   Rae Plotner, NP 12/18/20203:11 PM 

## 2019-11-27 LAB — FERRITIN: Ferritin: 1158 ng/mL — ABNORMAL HIGH (ref 11–307)

## 2019-11-27 LAB — IRON AND TIBC
Iron: 65 ug/dL (ref 41–142)
Saturation Ratios: 24 % (ref 21–57)
TIBC: 264 ug/dL (ref 236–444)
UIBC: 199 ug/dL (ref 120–384)

## 2019-11-27 NOTE — Addendum Note (Signed)
Addended by: Eliezer Bottom on: 11/27/2019 03:47 PM   Modules accepted: Level of Service

## 2019-11-27 NOTE — Progress Notes (Addendum)
Hematology and Oncology Follow Up Visit  DAKODA BASSETTE [id number] 09/23/1932 85 y.o. 11/24/2019   Principle Diagnosis:  Anemia ofchronicrenal insufficiency Intermittent iron deficiency anemia  Current Therapy:   Aranesp 300 mcg subcutaneous as needed for hemoglobin less than 10 IV iron as indicated   Interim History:  Ms. Goller had a telephone visit today for follow-up. She is doing well but has had some fatigue. Her vitamin D level was low and her PCP has increased her supplement.  She saw her cardiologist yesterday and had had some twinges of pain in her breast several times last week. She states that she got a good report.  No episodes of bleeding. No bruising or petechiae.  No fever, chills, n/v, cough, rash, dizziness, SOB, chest pain, palpitations, abdominal pain or changes in bowel or bladder habits.  No swelling, tenderness, numbness or tingling in her extremities at this time.  No falls or syncope.  She has maintained a good appetite and is staying hydrated. Her weight is stable at 153 lbs.   ECOG Performance Status: 1 - Symptomatic but completely ambulatory  Medications:  Allergies as of 11/24/2019      Reactions   Amoxicillin    Ampicillin    Aspirin    Clarithromycin    Codeine    Darvocet [propoxyphene N-acetaminophen]    Demerol    Diazepam    Emetrol    Erythromycin    Indomethacin    Iron    Pt states she is "allergic to iron pills"- no anaphylaxis to Iron. Tolerates Feraheme IV   Latex    Lisinopril    Meperidine    Oxycodone    Sulfa Antibiotics    Sulfonamide Derivatives    Valium       Medication List       Accurate as of November 24, 2019  3:11 PM. If you have any questions, ask your nurse or doctor.        amLODipine-olmesartan 10-40 MG tablet Commonly known as: AZOR amlodipine 10 mg-olmesartan 40 mg tablet   calcium carbonate 1250 (500 Ca) MG chewable tablet Commonly known as: OS-CAL Chew 1 tablet by mouth daily. Pt takes  Calcrate   cilostazol 100 MG tablet Commonly known as: PLETAL Take 100 mg by mouth 2 (two) times daily.   dexamethasone 4 MG tablet Commonly known as: DECADRON Take 4 mg by mouth as needed (for pain).   folic acid 1 MG tablet Commonly known as: FOLVITE Take 1 tablet (1 mg total) by mouth 2 (two) times daily.   losartan 100 MG tablet Commonly known as: COZAAR Take 100 mg by mouth daily.   meloxicam 7.5 MG tablet Commonly known as: MOBIC meloxicam 7.5 mg tablet   metoprolol succinate 25 MG 24 hr tablet Commonly known as: TOPROL-XL   NON FORMULARY Take by mouth 3 (three) times a week. Cleanse-more   PAPAYA ENZYMES PO Take by mouth 3 (three) times daily after meals.   UNABLE TO FIND nitrofurantoin monohydrate/macrocrystals 100 mg capsule   vitamin C 500 MG tablet Commonly known as: ASCORBIC ACID Take 500 mg by mouth daily. Take 2 po   VITAMIN D-3 PO Take by mouth every morning.   Voltaren 1 % Gel Generic drug: diclofenac sodium Apply topically 4 (four) times daily.   WHITE WILLOW BARK PO Take by mouth daily. " Pt states that white willow bark is natural aspirin "       Allergies:  Allergies  Allergen Reactions  . Amoxicillin   .  Ampicillin   . Aspirin   . Clarithromycin   . Codeine   . Darvocet [Propoxyphene N-Acetaminophen]   . Demerol   . Diazepam   . Emetrol   . Erythromycin   . Indomethacin   . Iron     Pt states she is "allergic to iron pills"- no anaphylaxis to Iron. Tolerates Feraheme IV  . Latex   . Lisinopril   . Meperidine   . Oxycodone   . Sulfa Antibiotics   . Sulfonamide Derivatives   . Valium     Past Medical History, Surgical history, Social history, and Family History were reviewed and updated.  Review of Systems: All other 10 point review of systems is negative.   Physical Exam:  vitals were not taken for this visit.   Wt Readings from Last 3 Encounters:  04/28/19 150 lb 6.4 oz (68.2 kg)  10/28/18 145 lb 12.8 oz (66.1  kg)  03/09/18 161 lb (73 kg)     Lab Results  Component Value Date   WBC 4.9 11/24/2019   HGB 11.3 (L) 11/24/2019   HCT 35.9 (L) 11/24/2019   MCV 90.9 11/24/2019   PLT 189 11/24/2019   Lab Results  Component Value Date   FERRITIN 1,163 (H) 04/28/2019   IRON 52 04/28/2019   TIBC 240 04/28/2019   UIBC 188 04/28/2019   IRONPCTSAT 22 04/28/2019   Lab Results  Component Value Date   RETICCTPCT 0.9 10/28/2018   RBC 3.95 11/24/2019   RETICCTABS 53.6 09/25/2015   No results found for: KPAFRELGTCHN, LAMBDASER, KAPLAMBRATIO No results found for: IGGSERUM, IGA, IGMSERUM No results found for: Marda Stalker, SPEI   Chemistry      Component Value Date/Time   NA 139 04/28/2019 1125   NA 146 (H) 09/07/2017 1140   NA 144 03/13/2016 1205   K 3.7 04/28/2019 1125   K 3.6 09/07/2017 1140   K 3.6 03/13/2016 1205   CL 104 04/28/2019 1125   CL 108 09/07/2017 1140   CO2 25 04/28/2019 1125   CO2 30 09/07/2017 1140   CO2 24 03/13/2016 1205   BUN 23 04/28/2019 1125   BUN 20 09/07/2017 1140   BUN 18.8 03/13/2016 1205   CREATININE 1.26 (H) 04/28/2019 1125   CREATININE 1.2 09/07/2017 1140   CREATININE 1.4 (H) 03/13/2016 1205      Component Value Date/Time   CALCIUM 9.8 04/28/2019 1125   CALCIUM 10.1 09/07/2017 1140   CALCIUM 9.9 03/13/2016 1205   ALKPHOS 51 04/28/2019 1125   ALKPHOS 52 09/07/2017 1140   ALKPHOS 50 03/13/2016 1205   AST 16 04/28/2019 1125   AST 18 03/13/2016 1205   ALT 13 04/28/2019 1125   ALT 22 09/07/2017 1140   ALT 16 03/13/2016 1205   BILITOT 0.4 04/28/2019 1125   BILITOT 0.31 03/13/2016 1205       Impression and Plan: Ms. Bernat is a very pleasant 85 yo African American female with multifactorial anemia, iron deficiency and anemia of chronic renal insufficiency.  Hgb remains stable at 11.3. No Aranesp needed this visit.  We will see what her iron studies show and bring her back in for infusion if  needed.  We will plan to see her back in another 6 months.  She will contact our office with any questions or concerns. We can certainly see her sooner if needed.   Emeline Gins, NP 12/18/20203:11 PM

## 2019-11-28 ENCOUNTER — Other Ambulatory Visit: Payer: Medicare Other

## 2019-11-28 ENCOUNTER — Ambulatory Visit: Payer: Medicare Other

## 2019-11-28 ENCOUNTER — Ambulatory Visit: Payer: Medicare Other | Admitting: Hematology & Oncology

## 2019-12-13 ENCOUNTER — Ambulatory Visit: Payer: Medicare Other | Admitting: Podiatry

## 2020-01-04 ENCOUNTER — Encounter: Payer: Self-pay | Admitting: Hematology & Oncology

## 2020-03-07 ENCOUNTER — Other Ambulatory Visit: Payer: Self-pay | Admitting: Hematology & Oncology

## 2020-03-07 DIAGNOSIS — D529 Folate deficiency anemia, unspecified: Secondary | ICD-10-CM

## 2020-03-14 DIAGNOSIS — D649 Anemia, unspecified: Secondary | ICD-10-CM | POA: Diagnosis not present

## 2020-03-14 DIAGNOSIS — E785 Hyperlipidemia, unspecified: Secondary | ICD-10-CM | POA: Diagnosis not present

## 2020-03-14 DIAGNOSIS — N189 Chronic kidney disease, unspecified: Secondary | ICD-10-CM | POA: Diagnosis not present

## 2020-03-14 DIAGNOSIS — M1990 Unspecified osteoarthritis, unspecified site: Secondary | ICD-10-CM | POA: Diagnosis not present

## 2020-03-14 DIAGNOSIS — I10 Essential (primary) hypertension: Secondary | ICD-10-CM | POA: Diagnosis not present

## 2020-03-14 DIAGNOSIS — E559 Vitamin D deficiency, unspecified: Secondary | ICD-10-CM | POA: Diagnosis not present

## 2020-03-14 DIAGNOSIS — I208 Other forms of angina pectoris: Secondary | ICD-10-CM | POA: Diagnosis not present

## 2020-04-02 ENCOUNTER — Emergency Department (HOSPITAL_COMMUNITY)
Admission: EM | Admit: 2020-04-02 | Discharge: 2020-04-02 | Disposition: A | Discharge status: Home / Self Care | Payer: Medicare Other | Attending: Emergency Medicine | Admitting: Emergency Medicine

## 2020-04-02 ENCOUNTER — Emergency Department (HOSPITAL_COMMUNITY): Payer: Medicare Other

## 2020-04-02 ENCOUNTER — Other Ambulatory Visit: Payer: Self-pay

## 2020-04-02 DIAGNOSIS — Z91040 Latex allergy status: Secondary | ICD-10-CM | POA: Insufficient documentation

## 2020-04-02 DIAGNOSIS — Y93E8 Activity, other personal hygiene: Secondary | ICD-10-CM | POA: Insufficient documentation

## 2020-04-02 DIAGNOSIS — M25561 Pain in right knee: Secondary | ICD-10-CM | POA: Diagnosis not present

## 2020-04-02 DIAGNOSIS — T148XXA Other injury of unspecified body region, initial encounter: Secondary | ICD-10-CM | POA: Insufficient documentation

## 2020-04-02 DIAGNOSIS — S79911A Unspecified injury of right hip, initial encounter: Secondary | ICD-10-CM | POA: Diagnosis not present

## 2020-04-02 DIAGNOSIS — I10 Essential (primary) hypertension: Secondary | ICD-10-CM | POA: Insufficient documentation

## 2020-04-02 DIAGNOSIS — Z79899 Other long term (current) drug therapy: Secondary | ICD-10-CM | POA: Diagnosis not present

## 2020-04-02 DIAGNOSIS — Y92002 Bathroom of unspecified non-institutional (private) residence single-family (private) house as the place of occurrence of the external cause: Secondary | ICD-10-CM | POA: Insufficient documentation

## 2020-04-02 DIAGNOSIS — W1811XA Fall from or off toilet without subsequent striking against object, initial encounter: Secondary | ICD-10-CM | POA: Diagnosis not present

## 2020-04-02 DIAGNOSIS — R52 Pain, unspecified: Secondary | ICD-10-CM | POA: Diagnosis not present

## 2020-04-02 DIAGNOSIS — S0990XA Unspecified injury of head, initial encounter: Secondary | ICD-10-CM | POA: Insufficient documentation

## 2020-04-02 DIAGNOSIS — S8991XA Unspecified injury of right lower leg, initial encounter: Secondary | ICD-10-CM | POA: Diagnosis not present

## 2020-04-02 DIAGNOSIS — R4020 Unspecified coma: Secondary | ICD-10-CM | POA: Diagnosis not present

## 2020-04-02 DIAGNOSIS — G4489 Other headache syndrome: Secondary | ICD-10-CM | POA: Diagnosis not present

## 2020-04-02 DIAGNOSIS — M79669 Pain in unspecified lower leg: Secondary | ICD-10-CM | POA: Diagnosis present

## 2020-04-02 DIAGNOSIS — W19XXXA Unspecified fall, initial encounter: Secondary | ICD-10-CM

## 2020-04-02 DIAGNOSIS — T07XXXA Unspecified multiple injuries, initial encounter: Secondary | ICD-10-CM

## 2020-04-02 DIAGNOSIS — R42 Dizziness and giddiness: Secondary | ICD-10-CM | POA: Diagnosis not present

## 2020-04-02 DIAGNOSIS — Y999 Unspecified external cause status: Secondary | ICD-10-CM | POA: Insufficient documentation

## 2020-04-02 DIAGNOSIS — S8001XA Contusion of right knee, initial encounter: Secondary | ICD-10-CM | POA: Diagnosis not present

## 2020-04-02 LAB — CBC WITH DIFFERENTIAL/PLATELET
Abs Immature Granulocytes: 0.01 10*3/uL (ref 0.00–0.07)
Basophils Absolute: 0 10*3/uL (ref 0.0–0.1)
Basophils Relative: 0 %
Eosinophils Absolute: 0.1 10*3/uL (ref 0.0–0.5)
Eosinophils Relative: 2 %
HCT: 32.4 % — ABNORMAL LOW (ref 36.0–46.0)
Hemoglobin: 9.9 g/dL — ABNORMAL LOW (ref 12.0–15.0)
Immature Granulocytes: 0 %
Lymphocytes Relative: 33 %
Lymphs Abs: 1.6 10*3/uL (ref 0.7–4.0)
MCH: 28.4 pg (ref 26.0–34.0)
MCHC: 30.6 g/dL (ref 30.0–36.0)
MCV: 93.1 fL (ref 80.0–100.0)
Monocytes Absolute: 0.5 10*3/uL (ref 0.1–1.0)
Monocytes Relative: 11 %
Neutro Abs: 2.5 10*3/uL (ref 1.7–7.7)
Neutrophils Relative %: 54 %
Platelets: 155 10*3/uL (ref 150–400)
RBC: 3.48 MIL/uL — ABNORMAL LOW (ref 3.87–5.11)
RDW: 14.6 % (ref 11.5–15.5)
WBC: 4.7 10*3/uL (ref 4.0–10.5)
nRBC: 0 % (ref 0.0–0.2)

## 2020-04-02 LAB — COMPREHENSIVE METABOLIC PANEL
ALT: 17 U/L (ref 0–44)
AST: 21 U/L (ref 15–41)
Albumin: 4 g/dL (ref 3.5–5.0)
Alkaline Phosphatase: 40 U/L (ref 38–126)
Anion gap: 9 (ref 5–15)
BUN: 28 mg/dL — ABNORMAL HIGH (ref 8–23)
CO2: 27 mmol/L (ref 22–32)
Calcium: 9.4 mg/dL (ref 8.9–10.3)
Chloride: 106 mmol/L (ref 98–111)
Creatinine, Ser: 1.25 mg/dL — ABNORMAL HIGH (ref 0.44–1.00)
GFR calc Af Amer: 45 mL/min — ABNORMAL LOW (ref >60)
GFR calc non Af Amer: 39 mL/min — ABNORMAL LOW (ref >60)
Glucose, Bld: 90 mg/dL (ref 70–99)
Potassium: 3.7 mmol/L (ref 3.5–5.1)
Sodium: 142 mmol/L (ref 135–145)
Total Bilirubin: 0.6 mg/dL (ref 0.3–1.2)
Total Protein: 6.9 g/dL (ref 6.5–8.1)

## 2020-04-02 NOTE — Discharge Instructions (Addendum)
You are seen in the ER after you had a fall last night.  Were not sure why you fell down, he does not seem like there was a seizure.  All the blood work and the imaging results are normal.  You are able to walk in the ER which is also reassuring.  We recommend that you follow-up with your primary care doctor in 3 to 5 days.  Please return to the ER if you have repeat symptoms.

## 2020-04-02 NOTE — ED Notes (Signed)
Pt ambulated independently  to restroom with walker.

## 2020-04-02 NOTE — ED Triage Notes (Signed)
Per [facility], patient was on toilet this morning, began shaking and fell off toilet. Patient hit right side of head on wall. Hematoma present on right side of head. No LOC, patient reports she feels dizzy

## 2020-04-03 NOTE — ED Provider Notes (Signed)
[facility]-EMERGENCY DEPT Provider Note   CSN: [id number] Arrival date & time: 04/02/20  1115     History Chief Complaint  Patient presents with  . Fall    Brittany Weber is a 86 y.o. female.  HPI    86 year old pleasant female comes in a chief complaint of fall. Patient has no significant medical history.  Patient reports that she was on the toilet around 2 AM.  She suddenly started having bobbing of her head, followed by shaking of her upper extremities/shoulder and torso, which led to her falling from the commode.  She fell onto her right side.  Patient was unable to get up, crawled onto a recliner.  This morning family decided to bring patient in for further evaluation.  Patient has no history of seizures.  She reports that she recalls the entire event, at no point she had LOC or confusion.  There is no history of stroke.  Patient also denies any previous tremors or movement disorder.  From the fall itself patient is having pain over the right side of her lower extremity.  She denies any severe headaches, nausea, vomiting, new neurologic symptoms.  Patient's family is at the bedside.  Patient typically lives by herself.  Past Medical History:  Diagnosis Date  . Anemia of renal disease 10/22/2011  . Anemia, iron deficiency 10/22/2011  . Cataract     Patient Active Problem List   Diagnosis Date Noted  . Pain due to onychomycosis of toenails of both feet 06/13/2019  . Coagulation disorder (HCC) 06/13/2019  . Glaucoma 12/25/2017  . Peripheral vascular disease (HCC) 06/28/2015  . Anemia of renal disease 10/22/2011  . Anemia, iron deficiency 10/22/2011  . DISORDER, THYROID NOS 07/20/2007  . Pancytopenia (HCC) 07/20/2007  . ANEMIA-NOS 07/20/2007  . HYPERTENSION 07/20/2007  . ALLERGIC RHINITIS 07/20/2007  . TMJ SYNDROME 07/20/2007  . DEGENERATIVE DISC DISEASE 07/20/2007  . OSTEOPENIA 07/20/2007    Past Surgical History:  Procedure Laterality Date   . ABDOMINAL HYSTERECTOMY    . APPENDECTOMY       OB History   No obstetric history on file.     Family History  Problem Relation Age of Onset  . Diabetes Father   . Diabetes Mother   . Kidney disease Mother     Social History   Tobacco Use  . Smoking status: Never Smoker  . Smokeless tobacco: Never Used  . Tobacco comment: never used tobacco  Substance Use Topics  . Alcohol use: No    Alcohol/week: 0.0 standard drinks  . Drug use: No    Home Medications Prior to Admission medications   Medication Sig Start Date End Date Taking? Authorizing Provider  amLODipine-olmesartan (AZOR) 10-40 MG tablet amlodipine 10 mg-olmesartan 40 mg tablet    [provider]  calcium carbonate (OS-CAL) 1250 MG chewable tablet Chew 1 tablet by mouth daily. Pt takes Pharmacist, community, Historical, MD  Cholecalciferol (VITAMIN D-3 PO) Take by mouth every morning.    [provider]  cilostazol (PLETAL) 100 MG tablet Take 100 mg by mouth 2 (two) times daily.  12/19/14   [provider]  dexamethasone (DECADRON) 4 MG tablet Take 4 mg by mouth as needed (for pain).    [provider]  diclofenac sodium (VOLTAREN) 1 % GEL Apply topically 4 (four) times daily.    [provider]  folic acid (FOLVITE) 1 MG tablet TAKE 1 TABLET BY MOUTH TWICE DAILY. 03/07/20   Ennever,  Rose Phi, MD  losartan (COZAAR) 100 MG tablet Take 100 mg by mouth daily.    [provider]  meloxicam (MOBIC) 7.5 MG tablet meloxicam 7.5 mg tablet    [provider]  metoprolol succinate (TOPROL-XL) 25 MG 24 hr tablet  06/07/19   [provider]  Misc Natural Products (WHITE WILLOW BARK PO) Take by mouth daily. " Pt states that white willow bark is natural aspirin "    [provider]  NON FORMULARY Take by mouth 3 (three) times a week. Cleanse-more    [provider]  PAPAYA ENZYMES PO Take by mouth 3 (three) times daily after meals.    [provider]  UNABLE TO FIND nitrofurantoin monohydrate/macrocrystals 100 mg capsule    [provider]  vitamin C (ASCORBIC ACID) 500 MG tablet Take 500 mg by mouth daily. Take 2 po    [provider]    Allergies    Amoxicillin, Ampicillin, Aspirin, Clarithromycin, Codeine, Darvocet [propoxyphene n-acetaminophen], Demerol, Diazepam, Emetrol, Erythromycin, Indomethacin, Iron, Latex, Lisinopril, Meperidine, Oxycodone, Sulfa antibiotics, Sulfonamide derivatives, and Valium  Review of Systems   Review of Systems  Constitutional: Positive for activity change.  Respiratory: Negative for shortness of breath.   Cardiovascular: Negative for chest pain.  Gastrointestinal: Negative for nausea and vomiting.  Musculoskeletal: Positive for arthralgias.  Neurological: Negative for headaches.  All other systems reviewed and are negative.   Physical Exam Updated Vital Signs BP (!) 155/82   Pulse 65   Resp 18   Ht 5\' 4"  (1.626 m)   Wt 73.9 kg   SpO2 100%   BMI 27.98 kg/m   Physical Exam Vitals and nursing note reviewed.  Constitutional:      Appearance: She is well-developed.  HENT:     Head: Normocephalic and atraumatic.  Cardiovascular:     Rate and Rhythm: Normal rate.  Pulmonary:     Effort: Pulmonary effort is normal.  Abdominal:     General: Bowel sounds are normal.  Musculoskeletal:        General: Tenderness present. No deformity.     Cervical back: Normal range of motion and neck supple.  Skin:    General: Skin is warm and dry.  Neurological:     Mental Status: She is alert and oriented to person, place, and time.     ED Results / Procedures / Treatments   Labs (all labs ordered are listed, but only abnormal results are displayed) Labs Reviewed  CBC WITH DIFFERENTIAL/PLATELET - Abnormal; Notable for the following components:      Result Value   RBC 3.48 (*)    Hemoglobin 9.9 (*)    HCT 32.4 (*)    All other components within normal limits    COMPREHENSIVE METABOLIC PANEL - Abnormal; Notable for the following components:   BUN 28 (*)    Creatinine, Ser 1.25 (*)    GFR calc non Af Amer 39 (*)    GFR calc Af Amer 45 (*)    All other components within normal limits    EKG None  Radiology DG Knee 2 Views Right  Result Date: 04/02/2020 CLINICAL DATA:  Pain following fall EXAM: RIGHT KNEE - 1-2 VIEW COMPARISON:  None. FINDINGS: Frontal and lateral views were obtained. There is sclerosis along the lateral tibial plateau. A compression type fracture in this area cannot be excluded. No other findings suggesting potential fracture. No dislocation. There is lateral patellar subluxation. There is marked narrowing of the lateral  compartment with moderate narrowing of the patellofemoral joint. There is spurring in all compartments. IMPRESSION: 1. Sclerosis along the lateral tibial plateau raises concern for compression type injury in this area. There is advanced osteoarthritic change in the lateral compartment which may contribute to sclerosis along the lateral tibial plateau. No other findings suggesting potential fracture. There is lateral patellar subluxation but no frank dislocation. No joint effusion. 2.  Osteoarthritic changes, most severe laterally. Electronically Signed   By: Lowella Grip III M.D.   On: 04/02/2020 13:33   CT Head Wo Contrast  Result Date: 04/02/2020 CLINICAL DATA:  Fall, right-sided head trauma EXAM: CT HEAD WITHOUT CONTRAST TECHNIQUE: Contiguous axial images were obtained from the base of the skull through the vertex without intravenous contrast. COMPARISON:  None. FINDINGS: Brain: No evidence of acute infarction, hemorrhage, hydrocephalus, extra-axial collection or mass lesion/mass effect. Extensive low-density changes within the periventricular and subcortical white matter compatible with chronic microvascular ischemic change. Moderate diffuse cerebral volume loss. Vascular: No hyperdense vessel or unexpected  calcification. Skull: Normal. Negative for fracture or focal lesion. Sinuses/Orbits: No acute finding. Other: None. IMPRESSION: 1.  No acute intracranial findings. 2.  Chronic microvascular ischemic change and cerebral volume loss. Electronically Signed   By: Davina Poke D.O.   On: 04/02/2020 13:51   DG Hip Unilat W or Wo Pelvis 2-3 Views Right  Result Date: 04/02/2020 CLINICAL DATA:  Fall EXAM: DG HIP (WITH OR WITHOUT PELVIS) 2-3V RIGHT COMPARISON:  2013 FINDINGS: There are advanced degenerative changes at the right hip with severe joint space narrowing and remodeling of the femoral head. No definite acute fracture. No dislocation. IMPRESSION: No acute fracture.  Severe right hip osteoarthritis. Electronically Signed   By: Macy Mis M.D.   On: 04/02/2020 13:33    Procedures Procedures (including critical care time)  Medications Ordered in ED Medications - No data to display  ED Course  I have reviewed the triage vital signs and the nursing notes.  Pertinent labs & imaging results that were available during my care of the patient were reviewed by me and considered in my medical decision making (see chart for details).    MDM Rules/Calculators/A&P                      86 year old female comes in a chief complaint of fall.  From the fall itself we have ordered appropriate imaging to ensure there is no bony fractures of the right side of the lower extremity.  CT had also ordered to ensure there is no bleed.  I am not sure what to make of patient's shaking.  She recalls the entire event, she had no LOC, there is no postictal state or incontinence.  It does not appear that this was a complex seizure.  I doubt there was partial seizure, as patient reports that both sides were shaking.  For now we will advise outpatient follow-up with PCP.  If there is a repeat episode then she has been advised to return to the ER.  Final Clinical Impression(s) / ED Diagnoses Final diagnoses:  Fall,  initial encounter  Multiple contusions    Rx / DC Orders ED Discharge Orders    None       Varney Biles, MD 04/03/20 1552

## 2020-04-09 ENCOUNTER — Encounter: Payer: Self-pay | Admitting: Podiatry

## 2020-04-09 ENCOUNTER — Ambulatory Visit (INDEPENDENT_AMBULATORY_CARE_PROVIDER_SITE_OTHER): Payer: Medicare Other | Admitting: Podiatry

## 2020-04-09 ENCOUNTER — Other Ambulatory Visit: Payer: Self-pay

## 2020-04-09 VITALS — Temp 97.8°F

## 2020-04-09 DIAGNOSIS — M79675 Pain in left toe(s): Secondary | ICD-10-CM | POA: Diagnosis not present

## 2020-04-09 DIAGNOSIS — M79674 Pain in right toe(s): Secondary | ICD-10-CM

## 2020-04-09 DIAGNOSIS — B351 Tinea unguium: Secondary | ICD-10-CM | POA: Diagnosis not present

## 2020-04-09 DIAGNOSIS — D689 Coagulation defect, unspecified: Secondary | ICD-10-CM | POA: Diagnosis not present

## 2020-04-09 DIAGNOSIS — I739 Peripheral vascular disease, unspecified: Secondary | ICD-10-CM | POA: Diagnosis not present

## 2020-04-09 NOTE — Progress Notes (Signed)
This patient returns to my office for at risk foot care.  This patient requires this care by a professional since this patient will be at risk due to having coagulation disorder and pvd.  Patient is taking pletal.  This patient is unable to cut nails herself since the patient cannot reach her nails.These nails are painful walking and wearing shoes.  This patient presents for at risk foot care today.  General Appearance  Alert, conversant and in no acute stress.  Vascular  Dorsalis pedis and posterior tibial  pulses are palpable  bilaterally.  Capillary return is within normal limits  bilaterally. Temperature is within normal limits  bilaterally.  Neurologic  Senn-Weinstein monofilament wire test within normal limits  bilaterally. Muscle power within normal limits bilaterally.  Nails Thick disfigured discolored nails with subungual debris  from hallux to fifth toes bilaterally. No evidence of bacterial infection or drainage bilaterally.  Orthopedic  No limitations of motion  feet .  No crepitus or effusions noted.  No bony pathology or digital deformities noted.  Skin  normotropic skin with no porokeratosis noted bilaterally.  No signs of infections or ulcers noted.     Onychomycosis  Pain in right toes  Pain in left toes  Consent was obtained for treatment procedures.   Mechanical debridement of nails 1-5  bilaterally performed with a nail nipper.  Filed with dremel without incident.    Return office visit   10 weeks                   Told patient to return for periodic foot care and evaluation due to potential at risk complications.   Lucelia Lacey DPM  

## 2020-05-20 ENCOUNTER — Other Ambulatory Visit: Payer: Self-pay | Admitting: Hematology & Oncology

## 2020-05-20 DIAGNOSIS — D529 Folate deficiency anemia, unspecified: Secondary | ICD-10-CM

## 2020-05-24 ENCOUNTER — Ambulatory Visit: Payer: Medicare Other | Admitting: Family

## 2020-05-24 ENCOUNTER — Inpatient Hospital Stay: Payer: Medicare Other

## 2020-06-06 ENCOUNTER — Inpatient Hospital Stay: Payer: Medicare Other | Admitting: Family

## 2020-06-06 ENCOUNTER — Inpatient Hospital Stay: Payer: Medicare Other | Attending: Family

## 2020-07-16 ENCOUNTER — Ambulatory Visit: Payer: Medicare Other | Admitting: Podiatry

## 2020-08-19 ENCOUNTER — Other Ambulatory Visit: Payer: Self-pay | Admitting: Hematology & Oncology

## 2020-08-19 DIAGNOSIS — D529 Folate deficiency anemia, unspecified: Secondary | ICD-10-CM

## 2020-08-21 DIAGNOSIS — I10 Essential (primary) hypertension: Secondary | ICD-10-CM | POA: Diagnosis not present

## 2020-08-21 DIAGNOSIS — I739 Peripheral vascular disease, unspecified: Secondary | ICD-10-CM | POA: Diagnosis not present

## 2020-08-21 DIAGNOSIS — E785 Hyperlipidemia, unspecified: Secondary | ICD-10-CM | POA: Diagnosis not present

## 2020-08-21 DIAGNOSIS — N189 Chronic kidney disease, unspecified: Secondary | ICD-10-CM | POA: Diagnosis not present

## 2020-08-21 DIAGNOSIS — I208 Other forms of angina pectoris: Secondary | ICD-10-CM | POA: Diagnosis not present

## 2020-10-09 ENCOUNTER — Ambulatory Visit: Payer: Medicare Other | Admitting: Podiatry

## 2020-10-23 ENCOUNTER — Other Ambulatory Visit: Payer: Self-pay

## 2020-10-23 ENCOUNTER — Ambulatory Visit (INDEPENDENT_AMBULATORY_CARE_PROVIDER_SITE_OTHER): Payer: Medicare Other | Admitting: Podiatry

## 2020-10-23 ENCOUNTER — Encounter: Payer: Self-pay | Admitting: Podiatry

## 2020-10-23 DIAGNOSIS — B351 Tinea unguium: Secondary | ICD-10-CM

## 2020-10-23 DIAGNOSIS — M79674 Pain in right toe(s): Secondary | ICD-10-CM

## 2020-10-23 DIAGNOSIS — M79675 Pain in left toe(s): Secondary | ICD-10-CM | POA: Diagnosis not present

## 2020-10-23 DIAGNOSIS — I739 Peripheral vascular disease, unspecified: Secondary | ICD-10-CM

## 2020-10-23 DIAGNOSIS — D689 Coagulation defect, unspecified: Secondary | ICD-10-CM

## 2020-10-23 NOTE — Progress Notes (Signed)
This patient returns to my office for at risk foot care.  This patient requires this care by a professional since this patient will be at risk due to having coagulation disorder and pvd.  Patient is taking pletal.  This patient is unable to cut nails herself since the patient cannot reach her nails.These nails are painful walking and wearing shoes.  This patient presents for at risk foot care today.  General Appearance  Alert, conversant and in no acute stress.  Vascular  Dorsalis pedis and posterior tibial  pulses are palpable  bilaterally.  Capillary return is within normal limits  bilaterally. Temperature is within normal limits  bilaterally.  Neurologic  Senn-Weinstein monofilament wire test within normal limits  bilaterally. Muscle power within normal limits bilaterally.  Nails Thick disfigured discolored nails with subungual debris  from hallux to fifth toes bilaterally. No evidence of bacterial infection or drainage bilaterally.  Orthopedic  No limitations of motion  feet .  No crepitus or effusions noted.  No bony pathology or digital deformities noted.  Skin  normotropic skin with no porokeratosis noted bilaterally.  No signs of infections or ulcers noted.     Onychomycosis  Pain in right toes  Pain in left toes  Consent was obtained for treatment procedures.   Mechanical debridement of nails 1-5  bilaterally performed with a nail nipper.  Filed with dremel without incident.    Return office visit   10 weeks                   Told patient to return for periodic foot care and evaluation due to potential at risk complications.   [name] DPM

## 2020-11-19 DIAGNOSIS — E559 Vitamin D deficiency, unspecified: Secondary | ICD-10-CM | POA: Diagnosis not present

## 2020-11-19 DIAGNOSIS — E785 Hyperlipidemia, unspecified: Secondary | ICD-10-CM | POA: Diagnosis not present

## 2020-11-19 DIAGNOSIS — I739 Peripheral vascular disease, unspecified: Secondary | ICD-10-CM | POA: Diagnosis not present

## 2020-11-19 DIAGNOSIS — I10 Essential (primary) hypertension: Secondary | ICD-10-CM | POA: Diagnosis not present

## 2020-11-19 DIAGNOSIS — I208 Other forms of angina pectoris: Secondary | ICD-10-CM | POA: Diagnosis not present

## 2021-02-14 ENCOUNTER — Emergency Department (HOSPITAL_COMMUNITY): Payer: Medicare Other

## 2021-02-14 ENCOUNTER — Emergency Department (HOSPITAL_COMMUNITY)
Admission: EM | Admit: 2021-02-14 | Discharge: 2021-02-17 | Disposition: A | Discharge status: Home / Self Care | Payer: Medicare Other | Attending: Emergency Medicine | Admitting: Emergency Medicine

## 2021-02-14 ENCOUNTER — Encounter (HOSPITAL_COMMUNITY): Payer: Self-pay | Admitting: Emergency Medicine

## 2021-02-14 ENCOUNTER — Other Ambulatory Visit: Payer: Self-pay

## 2021-02-14 DIAGNOSIS — Z91040 Latex allergy status: Secondary | ICD-10-CM | POA: Diagnosis not present

## 2021-02-14 DIAGNOSIS — Z20822 Contact with and (suspected) exposure to covid-19: Secondary | ICD-10-CM | POA: Insufficient documentation

## 2021-02-14 DIAGNOSIS — W2209XA Striking against other stationary object, initial encounter: Secondary | ICD-10-CM | POA: Insufficient documentation

## 2021-02-14 DIAGNOSIS — R002 Palpitations: Secondary | ICD-10-CM | POA: Insufficient documentation

## 2021-02-14 DIAGNOSIS — M25552 Pain in left hip: Secondary | ICD-10-CM | POA: Diagnosis not present

## 2021-02-14 DIAGNOSIS — S299XXA Unspecified injury of thorax, initial encounter: Secondary | ICD-10-CM | POA: Diagnosis present

## 2021-02-14 DIAGNOSIS — I10 Essential (primary) hypertension: Secondary | ICD-10-CM | POA: Diagnosis not present

## 2021-02-14 DIAGNOSIS — W19XXXA Unspecified fall, initial encounter: Secondary | ICD-10-CM

## 2021-02-14 DIAGNOSIS — Y9301 Activity, walking, marching and hiking: Secondary | ICD-10-CM | POA: Insufficient documentation

## 2021-02-14 DIAGNOSIS — S2242XA Multiple fractures of ribs, left side, initial encounter for closed fracture: Secondary | ICD-10-CM | POA: Diagnosis not present

## 2021-02-14 DIAGNOSIS — M1612 Unilateral primary osteoarthritis, left hip: Secondary | ICD-10-CM | POA: Diagnosis not present

## 2021-02-14 NOTE — ED Provider Notes (Incomplete)
[facility]-EMERGENCY DEPT Provider Note  CSN: [id number] Arrival date & time: 02/14/21 2229  Chief Complaint(s) Fall  HPI Brittany Weber is a 87 y.o. female ***  HPI  Past Medical History Past Medical History:  Diagnosis Date  . Anemia of renal disease 10/22/2011  . Anemia, iron deficiency 10/22/2011  . Cataract    Patient Active Problem List   Diagnosis Date Noted  . Pain due to onychomycosis of toenails of both feet 06/13/2019  . Coagulation disorder (HCC) 06/13/2019  . Glaucoma 12/25/2017  . Peripheral vascular disease (HCC) 06/28/2015  . Anemia of renal disease 10/22/2011  . Anemia, iron deficiency 10/22/2011  . DISORDER, THYROID NOS 07/20/2007  . Pancytopenia (HCC) 07/20/2007  . ANEMIA-NOS 07/20/2007  . HYPERTENSION 07/20/2007  . ALLERGIC RHINITIS 07/20/2007  . TMJ SYNDROME 07/20/2007  . DEGENERATIVE DISC DISEASE 07/20/2007  . OSTEOPENIA 07/20/2007   Home Medication(s) Prior to Admission medications   Medication Sig Start Date End Date Taking? Authorizing Provider  amLODipine-olmesartan (AZOR) 10-40 MG tablet amlodipine 10 mg-olmesartan 40 mg tablet    [provider]  calcium carbonate (OS-CAL) 1250 MG chewable tablet Chew 1 tablet by mouth daily. Pt takes Pharmacist, community, Historical, MD  Cholecalciferol (VITAMIN D-3 PO) Take by mouth every morning.    [provider]  cilostazol (PLETAL) 100 MG tablet Take 100 mg by mouth 2 (two) times daily.  12/19/14   [provider]  dexamethasone (DECADRON) 4 MG tablet Take 4 mg by mouth as needed (for pain).    [provider]  diclofenac sodium (VOLTAREN) 1 % GEL Apply topically 4 (four) times daily.    [provider]  folic acid (FOLVITE) 1 MG tablet TAKE 1 TABLET BY MOUTH TWICE DAILY. 08/19/20   [name], MD  losartan (COZAAR) 100 MG tablet Take 100 mg by mouth daily.    [provider]  meloxicam (MOBIC) 7.5 MG tablet meloxicam 7.5  mg tablet    [provider]  metoprolol succinate (TOPROL-XL) 25 MG 24 hr tablet  06/07/19   [provider]  Misc Natural Products (WHITE WILLOW BARK PO) Take by mouth daily. " Pt states that white willow bark is natural aspirin "    [provider]  NON FORMULARY Take by mouth 3 (three) times a week. Cleanse-more    [provider]  PAPAYA ENZYMES PO Take by mouth 3 (three) times daily after meals.    [provider]  UNABLE TO FIND nitrofurantoin monohydrate/macrocrystals 100 mg capsule    [provider]  vitamin C (ASCORBIC ACID) 500 MG tablet Take 500 mg by mouth daily. Take 2 po    [provider]                                                                                                                                    Past Surgical History  Past Surgical History:  Procedure Laterality Date  . ABDOMINAL HYSTERECTOMY    . APPENDECTOMY     Family History Family History  Problem Relation Age of Onset  . Diabetes Father   . Diabetes Mother   . Kidney disease Mother     Social History Social History   Tobacco Use  . Smoking status: Never Smoker  . Smokeless tobacco: Never Used  . Tobacco comment: never used tobacco  Vaping Use  . Vaping Use: Never used  Substance Use Topics  . Alcohol use: No    Alcohol/week: 0.0 standard drinks  . Drug use: No   Allergies Amoxicillin, Ampicillin, Aspirin, Clarithromycin, Codeine, Darvocet [propoxyphene n-acetaminophen], Demerol, Diazepam, Emetrol, Erythromycin, Indomethacin, Iron, Latex, Lisinopril, Meperidine, Oxycodone, Sulfa antibiotics, Sulfonamide derivatives, and Valium  Review of Systems Review of Systems *** Physical Exam Vital Signs  I have reviewed the triage vital signs BP (!) 178/77 (BP Location: Left Arm)   Pulse 80   Temp 98 F (36.7 C) (Oral)   Resp 19   SpO2 100%  *** Physical Exam Constitutional:      General: She is not in acute distress.     Appearance: She is well-developed. She is not diaphoretic.  HENT:     Head: Normocephalic and atraumatic.     Right Ear: External ear normal.     Left Ear: External ear normal.     Nose: Nose normal.  Eyes:     General: No scleral icterus.       Right eye: No discharge.        Left eye: No discharge.     Conjunctiva/sclera: Conjunctivae normal.     Pupils: Pupils are equal, round, and reactive to light.  Cardiovascular:     Rate and Rhythm: Normal rate and regular rhythm.     Pulses:          Radial pulses are 2+ on the right side and 2+ on the left side.       Dorsalis pedis pulses are 2+ on the right side and 2+ on the left side.     Heart sounds: Normal heart sounds. No murmur heard. No friction rub. No gallop.   Pulmonary:     Effort: Pulmonary effort is normal. No respiratory distress.     Breath sounds: Normal breath sounds. No stridor. No wheezing.  Chest:     Chest wall: Tenderness present.    Abdominal:     General: There is no distension.     Palpations: Abdomen is soft.     Tenderness: There is no abdominal tenderness.  Musculoskeletal:     Cervical back: Normal range of motion and neck supple. No bony tenderness.     Thoracic back: No bony tenderness.     Lumbar back: No bony tenderness.     Left hip: Tenderness and bony tenderness present. Normal range of motion. Normal strength.     Comments: Clavicles stable. Chest stable to AP/Lat compression. Pelvis stable to Lat compression. No obvious extremity deformity. No chest or abdominal wall contusion.  Skin:    General: Skin is warm and dry.     Findings: No erythema or rash.  Neurological:     Mental Status: She is alert and oriented to person, place, and time.     Comments: Moving all extremities     ED Results and Treatments Labs (all labs ordered are listed, but only abnormal results are displayed) Labs Reviewed - No data to display  EKG  EKG Interpretation  Date/Time:    Ventricular Rate:    PR Interval:    QRS Duration:   QT Interval:    QTC Calculation:   R Axis:     Text Interpretation:        Radiology No results found.  Pertinent labs & imaging results that were available during my care of the patient were reviewed by me and considered in my medical decision making (see chart for details).  Medications Ordered in ED Medications - No data to display                                                                                                                                  Procedures Procedures  (including critical care time)  Medical Decision Making / ED Course I have reviewed the nursing notes for this encounter and the patient's prior records (if available in EHR or on provided paperwork).   Brittany Weber was evaluated in Emergency Department on 02/14/2021 for the symptoms described in the history of present illness. She was evaluated in the context of the global COVID-19 pandemic, which necessitated consideration that the patient might be at risk for infection with the SARS-CoV-2 virus that causes COVID-19. Institutional protocols and algorithms that pertain to the evaluation of patients at risk for COVID-19 are in a state of rapid change based on information released by regulatory bodies including the CDC and federal and state organizations. These policies and algorithms were followed during the patient's care in the ED.  ***      Final Clinical Impression(s) / ED Diagnoses Final diagnoses:  None      This chart was dictated using voice recognition software.  Despite best efforts to proofread,  errors can occur which can change the documentation meaning.

## 2021-02-14 NOTE — ED Triage Notes (Signed)
Patient here from home reporting fall today. Reports that she hit side on wall. "space between the left rib and hip".

## 2021-02-15 DIAGNOSIS — M1612 Unilateral primary osteoarthritis, left hip: Secondary | ICD-10-CM | POA: Diagnosis not present

## 2021-02-15 DIAGNOSIS — S2242XA Multiple fractures of ribs, left side, initial encounter for closed fracture: Secondary | ICD-10-CM | POA: Diagnosis not present

## 2021-02-15 MED ORDER — LIDOCAINE 5 % EX PTCH
1.0000 | MEDICATED_PATCH | CUTANEOUS | Status: DC
  Administered 2021-02-15 – 2021-02-17 (×3): 1 via TRANSDERMAL
  Filled 2021-02-15 (×4): qty 1

## 2021-02-15 MED ORDER — ACETAMINOPHEN 500 MG PO TABS
500.0000 mg | ORAL_TABLET | Freq: Once | ORAL | Status: AC
  Administered 2021-02-15: 500 mg via ORAL
  Filled 2021-02-15: qty 1

## 2021-02-15 MED ORDER — ACETAMINOPHEN 325 MG PO TABS
650.0000 mg | ORAL_TABLET | Freq: Once | ORAL | Status: AC
  Administered 2021-02-15: 650 mg via ORAL
  Filled 2021-02-15: qty 2

## 2021-02-15 MED ORDER — LIDOCAINE 4 % EX PTCH: 1.0000 | MEDICATED_PATCH | Freq: Every day | CUTANEOUS | 1 refills | Status: DC

## 2021-02-15 MED ORDER — DICLOFENAC SODIUM 1 % EX GEL: 2.0000 g | Freq: Four times a day (QID) | CUTANEOUS | 1 refills | Status: AC

## 2021-02-15 NOTE — ED Notes (Signed)
Pt was standing at bedside with urine over floor, states she got up to "Pee", pt unsteady on feet, assist of 3 to place back in bed. At this time a safety belt was placed on pt and bed alarm turned on. Pt continues to wait for [facility] consult for PT/OT.

## 2021-02-15 NOTE — Discharge Instructions (Addendum)
For pain control you may take at 1000 mg of Tylenol every 8 hours as needed. You can also apply Voltaren cream and/or apply a Lidoderm patch.  Please be sure to use your incentive spirometer 1-2 times per hour while awake.  Take at least 5 deep breaths through it each time.  If you are sitting around watching TV, keep it by her side and take 2-3 deep breaths with each commercial break.  Please follow-up with your primary care provider for any additional assistance with pain management.

## 2021-02-15 NOTE — [place] FL2 (Signed)
[place] MEDICAID FL2 LEVEL OF CARE SCREENING TOOL     IDENTIFICATION  Patient Name: Brittany Weber Birthdate: 08-26-1932 Sex: female Admission Date (Current Location): 02/14/2021  [facility] and [place] Number:  Producer, television/film/video and Address:  [facility],  [address], [place] [address]      Provider Number: [id number]  Attending Physician Name and Address:  Default, Provider, MD  Relative Name and Phone Number:       Current Level of Care: Hospital Recommended Level of Care: Skilled Nursing Facility Prior Approval Number:    Date Approved/Denied:   PASRR Number: [id number]  Discharge Plan: SNF    Current Diagnoses: Patient Active Problem List   Diagnosis Date Noted  . Pain due to onychomycosis of toenails of both feet 06/13/2019  . Coagulation disorder (HCC) 06/13/2019  . Glaucoma 12/25/2017  . Peripheral vascular disease (HCC) 06/28/2015  . Anemia of renal disease 10/22/2011  . Anemia, iron deficiency 10/22/2011  . DISORDER, THYROID NOS 07/20/2007  . Pancytopenia (HCC) 07/20/2007  . ANEMIA-NOS 07/20/2007  . HYPERTENSION 07/20/2007  . ALLERGIC RHINITIS 07/20/2007  . TMJ SYNDROME 07/20/2007  . DEGENERATIVE DISC DISEASE 07/20/2007  . OSTEOPENIA 07/20/2007    Orientation RESPIRATION BLADDER Height & Weight     Self,Time,Situation,Place  Normal Continent Weight:   Height:     BEHAVIORAL SYMPTOMS/MOOD NEUROLOGICAL BOWEL NUTRITION STATUS      Continent Diet  AMBULATORY STATUS COMMUNICATION OF NEEDS Skin   Extensive Assist Verbally Normal                       Personal Care Assistance Level of Assistance  Feeding,Bathing,Dressing Bathing Assistance: Limited assistance Feeding assistance: Limited assistance Dressing Assistance: Maximum assistance     Functional Limitations Info  Sight,Speech,Hearing Sight Info: Impaired (Glasses) Hearing Info: Adequate Speech Info: Adequate    SPECIAL CARE FACTORS FREQUENCY  PT (By  licensed PT),OT (By licensed OT)     PT Frequency: 5x weekly OT Frequency: 5x weekly            Contractures Contractures Info: Not present    Additional Factors Info  Allergies,Code Status Code Status Info: Full Allergies Info: Amoxicillin   Ampicillin   Aspirin   Clarithromycin   Codeine   Darvocet (Propoxyphene N-acetaminophen)   Demerol   Diazepam   Emetrol   Erythromycin   Indomethacin   Iron   Latex   Lisinopril   Meperidine   Oxycodone   Sulfa Antibiotics   Sulfonamide Derivatives   Valium           Current Medications (02/15/2021):  This is the current hospital active medication list Current Facility-Administered Medications  Medication Dose Route Frequency Provider Last Rate Last Admin  . lidocaine (LIDODERM) 5 % 1 patch  1 patch Transdermal Q24H Cardama, Amadeo Garnet, MD   1 patch at 02/15/21 8469   Current Outpatient Medications  Medication Sig Dispense Refill  . Cholecalciferol (VITAMIN D-3 PO) Take by mouth every morning.    . diclofenac Sodium (VOLTAREN) 1 % GEL Apply 2 g topically 4 (four) times daily. 50 g 1  . Lidocaine (HM LIDOCAINE PATCH) 4 % PTCH Apply 1 each topically daily. 30 patch 1  . Misc Natural Products (WHITE WILLOW BARK PO) Take by mouth daily. " Pt states that white willow bark is natural aspirin "    . NON FORMULARY Take 1 tablet by mouth as needed. Laxative (Cleanse-More)    . PAPAYA ENZYMES PO  Take by mouth 3 (three) times daily after meals.    . vitamin C (ASCORBIC ACID) 500 MG tablet Take 500 mg by mouth daily. Take 2 po    . folic acid (FOLVITE) 1 MG tablet TAKE 1 TABLET BY MOUTH TWICE DAILY. (Patient not taking: Reported on 02/15/2021) 180 tablet 0     Discharge Medications: Please see discharge summary for a list of discharge medications.  Relevant Imaging Results:  Relevant Lab Results:   Additional Information SSN: [id number]  Inis Sizer, LCSW

## 2021-02-15 NOTE — ED Notes (Signed)
Niece called to state she will be back around 10am, hopefully she will be able to meet with TOC.

## 2021-02-15 NOTE — ED Notes (Signed)
Patient received dinner tray 

## 2021-02-15 NOTE — Progress Notes (Signed)
CSW completed screening and obtained patient's PASRR number - [id number].  CSW completed FL2 and faxed it out to [facility].  Cathlean Cower, RN CM spoke with patient's niece who stated preference for [facility] or [facility].  Edwin Dada, MSW, LCSW Transitions of Care  Clinical Social Worker II [phone]

## 2021-02-15 NOTE — ED Notes (Signed)
I spoke with Dr. Lynelle Doctor about the lidocaine patches which were ordered those patches are over $400 and are not covered by the insurance so he advised that they get the over the counter ones [facility] thanks [place] [phone]

## 2021-02-15 NOTE — ED Notes (Signed)
Breakfast set up for pt to eat

## 2021-02-15 NOTE — ED Notes (Signed)
Pt waiting for PT consult.

## 2021-02-15 NOTE — Progress Notes (Addendum)
.   Transition of Care [facility]) - Emergency Department Mini Assessment   Patient Details  Name: Brittany Weber MRN: [id number] Date of Birth: 06-08-1932  Transition of Care [facility]) CM/SW Contact:    Elliot Cousin, RN Phone Number: [phone] 02/15/2021, 9:16 AM   Clinical Narrative: 140 pm Spoke to pt and gave permission to speak to niece. States she is agrees to [facility] rehab. Gave permission to create FL2 and fax referral for SNF rehab. Spoke to niece, [name]. She prefers [facility] or [facility]. Spoke to [employer], Passenger transport manager, at [facility]. They will have bed on Monday. Explained she has not been vaccinated or received flu shot.    916 am TOC CM spoke to pt's niece, [name]. She has hospital bed, rollator, and bedside commode. Offered choice for [facility]. Agreeable to [facility]. States she had [place] Medicaid PCS in the past and pt cancelled. Pt may benefit from SNF rehab. Will have PT evaluation. Pt lives in home alone.   ED Mini Assessment: What brought you to the Emergency Department? : fall  Barriers to Discharge: No Barriers Identified  Barrier interventions: arranged home health  Means of departure: Car  Interventions which prevented an admission or readmission: Home Health Consult or Services    Patient Contact and Communications Key Contact 1: [name]   Spoke with: niece Contact Date: 02/15/21,   Contact time: 0916 Contact Phone Number: [phone]    Patient states their goals for this hospitalization and ongoing recovery are:: to return home CMS Medicare.gov Compare Post Acute Care list provided to:: Patient Represenative (must comment) (niece-[name]) Choice offered to / list presented to : Adult Children  Admission diagnosis:  fall Patient Active Problem List   Diagnosis Date Noted  . Pain due to onychomycosis of toenails of both feet 06/13/2019  . Coagulation disorder (HCC) 06/13/2019  . Glaucoma 12/25/2017  . Peripheral vascular disease  (HCC) 06/28/2015  . Anemia of renal disease 10/22/2011  . Anemia, iron deficiency 10/22/2011  . DISORDER, THYROID NOS 07/20/2007  . Pancytopenia (HCC) 07/20/2007  . ANEMIA-NOS 07/20/2007  . HYPERTENSION 07/20/2007  . ALLERGIC RHINITIS 07/20/2007  . TMJ SYNDROME 07/20/2007  . DEGENERATIVE DISC DISEASE 07/20/2007  . OSTEOPENIA 07/20/2007   PCP:  [name], NP Pharmacy:   [facility] [place], [place] - [address] [place] [address] Phone: [phone] Fax: [phone]  [facility] [place], [place] - [address] [place] [address] Phone: [phone] Fax: [phone]

## 2021-02-15 NOTE — ED Notes (Signed)
Pt continues to wait for PT consult. No voiced complaint, resting at present time

## 2021-02-15 NOTE — ED Provider Notes (Addendum)
[facility]-EMERGENCY DEPT Provider Note  CSN: [id number] Arrival date & time: 02/14/21 2229  Chief Complaint(s) Fall  HPI Brittany Weber is a 87 y.o. female presents after a mechanical fall at home. Reports that she was walking from one room and try to get into another when she lost her footing causing her to fall and hit the wall or doorway framing.  She hit her left side of her chest wall and hip upon falling. She is endorsing pain to her hip and left ribs. Pain a moderate aching worse with palpation and alleviated by immobility. She denied any head trauma or loss of consciousness. She denies any neck pain or back pain.  No chest pain or shortness of breath.  No abdominal pain.  No other extremity pain.  HPI  Past Medical History Past Medical History:  Diagnosis Date  . Anemia of renal disease 10/22/2011  . Anemia, iron deficiency 10/22/2011  . Cataract    Patient Active Problem List   Diagnosis Date Noted  . Pain due to onychomycosis of toenails of both feet 06/13/2019  . Coagulation disorder (HCC) 06/13/2019  . Glaucoma 12/25/2017  . Peripheral vascular disease (HCC) 06/28/2015  . Anemia of renal disease 10/22/2011  . Anemia, iron deficiency 10/22/2011  . DISORDER, THYROID NOS 07/20/2007  . Pancytopenia (HCC) 07/20/2007  . ANEMIA-NOS 07/20/2007  . HYPERTENSION 07/20/2007  . ALLERGIC RHINITIS 07/20/2007  . TMJ SYNDROME 07/20/2007  . DEGENERATIVE DISC DISEASE 07/20/2007  . OSTEOPENIA 07/20/2007   Home Medication(s) Prior to Admission medications   Medication Sig Start Date End Date Taking? Authorizing Provider  Cholecalciferol (VITAMIN D-3 PO) Take by mouth every morning.   Yes [provider]  diclofenac Sodium (VOLTAREN) 1 % GEL Apply 2 g topically 4 (four) times daily. 02/15/21  Yes Lacosta Hargan, Amadeo Garnet, MD  Lidocaine (HM LIDOCAINE PATCH) 4 % PTCH Apply 1 each topically daily. 02/15/21  Yes Jayanna Kroeger, Amadeo Garnet, MD  Misc Natural  Products (WHITE WILLOW BARK PO) Take by mouth daily. " Pt states that white willow bark is natural aspirin "   Yes [provider]  NON FORMULARY Take 1 tablet by mouth as needed. Laxative (Cleanse-More)   Yes [provider]  PAPAYA ENZYMES PO Take by mouth 3 (three) times daily after meals.   Yes [provider]  vitamin C (ASCORBIC ACID) 500 MG tablet Take 500 mg by mouth daily. Take 2 po   Yes [provider]  folic acid (FOLVITE) 1 MG tablet TAKE 1 TABLET BY MOUTH TWICE DAILY. Patient not taking: Reported on 02/15/2021 08/19/20   [name], MD                                                                                                                                    Past Surgical History Past Surgical History:  Procedure Laterality Date  . ABDOMINAL HYSTERECTOMY    .  APPENDECTOMY     Family History Family History  Problem Relation Age of Onset  . Diabetes Father   . Diabetes Mother   . Kidney disease Mother     Social History Social History   Tobacco Use  . Smoking status: Never Smoker  . Smokeless tobacco: Never Used  . Tobacco comment: never used tobacco  Vaping Use  . Vaping Use: Never used  Substance Use Topics  . Alcohol use: No    Alcohol/week: 0.0 standard drinks  . Drug use: No   Allergies Amoxicillin, Ampicillin, Aspirin, Clarithromycin, Codeine, Darvocet [propoxyphene n-acetaminophen], Demerol, Diazepam, Emetrol, Erythromycin, Indomethacin, Iron, Latex, Lisinopril, Meperidine, Oxycodone, Sulfa antibiotics, Sulfonamide derivatives, and Valium  Review of Systems Review of Systems All other systems are reviewed and are negative for acute change except as noted in the HPI  Physical Exam Vital Signs  I have reviewed the triage vital signs BP (!) 178/77 (BP Location: Left Arm)   Pulse 80   Temp 98 F (36.7 C) (Oral)   Resp 19   SpO2 100%   Physical Exam Constitutional:      General: She is not in acute  distress.    Appearance: She is well-developed. She is not diaphoretic.  HENT:     Head: Normocephalic and atraumatic.     Right Ear: External ear normal.     Left Ear: External ear normal.     Nose: Nose normal.  Eyes:     General: No scleral icterus.       Right eye: No discharge.        Left eye: No discharge.     Conjunctiva/sclera: Conjunctivae normal.     Pupils: Pupils are equal, round, and reactive to light.  Cardiovascular:     Rate and Rhythm: Normal rate and regular rhythm.     Pulses:          Radial pulses are 2+ on the right side and 2+ on the left side.       Dorsalis pedis pulses are 2+ on the right side and 2+ on the left side.     Heart sounds: Normal heart sounds. No murmur heard. No friction rub. No gallop.   Pulmonary:     Effort: Pulmonary effort is normal. No respiratory distress.     Breath sounds: Normal breath sounds. No stridor. No wheezing.  Chest:     Chest wall: Tenderness present.    Abdominal:     General: There is no distension.     Palpations: Abdomen is soft.     Tenderness: There is no abdominal tenderness.  Musculoskeletal:     Cervical back: Normal range of motion and neck supple. No bony tenderness.     Thoracic back: No bony tenderness.     Lumbar back: No bony tenderness.     Left hip: Tenderness and bony tenderness present. Normal range of motion. Normal strength.     Comments: Clavicles stable. Chest stable to AP/Lat compression. Pelvis stable to Lat compression. No obvious extremity deformity. No chest or abdominal wall contusion.  Skin:    General: Skin is warm and dry.     Findings: No erythema or rash.  Neurological:     Mental Status: She is alert and oriented to person, place, and time.     Comments: Moving all extremities  ED Results and Treatments Labs (all labs ordered are listed, but only abnormal results are displayed) Labs Reviewed - No data to display  EKG  EKG Interpretation  Date/Time:    Ventricular Rate:    PR Interval:    QRS Duration:   QT Interval:    QTC Calculation:   R Axis:     Text Interpretation:        Radiology DG Ribs Unilateral W/Chest Left  Result Date: 02/15/2021 CLINICAL DATA:  Larey Seat, left-sided pain EXAM: LEFT RIBS AND CHEST - 3+ VIEW COMPARISON:  01/11/2004 FINDINGS: Frontal view of the chest as well as frontal and oblique views of the left thoracic cage are obtained. There are left posterolateral eighth and ninth rib fractures. No airspace disease, effusion, or pneumothorax. The cardiac silhouette is unremarkable. IMPRESSION: 1. Minimally displaced left anterolateral eighth and ninth rib fractures. 2. No effusion or pneumothorax. Electronically Signed   By: Sharlet Salina M.D.   On: 02/15/2021 00:29   DG HIP UNILAT W OR W/O PELVIS 2-3 VIEWS LEFT  Result Date: 02/15/2021 CLINICAL DATA:  Larey Seat, left-sided pain EXAM: DG HIP (WITH OR WITHOUT PELVIS) 2-3V LEFT COMPARISON:  01/28/2012 FINDINGS: Frontal view of the pelvis as well as frontal and frogleg lateral views of the left hip are obtained. There are no acute displaced fractures. Stable mild left hip osteoarthritis. Progressive severe right hip osteoarthritis with complete loss of joint space, bony remodeling of the acetabulum and femoral head, and marked osteophyte formation. The remainder of the bony pelvis is unremarkable. The sacroiliac joints are normal. IMPRESSION: 1. No acute displaced fracture. 2. Stable mild left hip osteoarthritis. 3. Progressive severe right hip osteoarthritis. Electronically Signed   By: Sharlet Salina M.D.   On: 02/15/2021 00:28    Pertinent labs & imaging results that were available during my care of the patient were reviewed by me and considered in my medical decision making (see chart for details).  Medications Ordered in ED Medications  lidocaine (LIDODERM) 5 % 1 patch (has no  administration in time range)                                                                                                                                    Procedures Procedures  (including critical care time)  Medical Decision Making / ED Course I have reviewed the nursing notes for this encounter and the patient's prior records (if available in EHR or on provided paperwork).   LAVONN MAXCY was evaluated in Emergency Department on 02/15/2021 for the symptoms described in the history of present illness. She was evaluated in the context of the global COVID-19 pandemic, which necessitated consideration that the patient might be at risk for infection with the SARS-CoV-2 virus that causes COVID-19. Institutional protocols and algorithms that pertain to the evaluation of patients at risk for COVID-19 are in a state of rapid change based on information released by regulatory bodies including the CDC and federal and state organizations. These policies and algorithms were followed during the patient's care in the ED.  Mechanical  fall resulting in left rib pain and left hip pain.  No other injuries noted on exam requiring imaging. Patient's not anticoagulated. Plain film of the left hip and ribs obtain. Plain film of the left hip negative. Rib x-ray notable for fracture of the eighth and ninth rib. Patient declined any pain medicine. Will provide with incentive spirometer.  Recommend as needed Tylenol as patient does not want any potent pain medication. Lidoderm patch provided.  Patient had difficulty transitioning in and out wheelchair and bed.  Per the niece the patient lives alone.  Will hold overnight till the morning while family is able to make changes to the patient's living conditions at home.  We will consults transition of care for home health.     Final Clinical Impression(s) / ED Diagnoses Final diagnoses:  Fall in home, initial encounter  Closed fracture of multiple ribs of  left side, initial encounter    The patient appears reasonably screened and/or stabilized for discharge and I doubt any other medical condition or other [facility] requiring further screening, evaluation, or treatment in the ED at this time prior to discharge. Safe for discharge with strict return precautions.  Disposition: Discharge  Condition: Good  I have discussed the results, Dx and Tx plan with the patient/family who expressed understanding and agree(s) with the plan. Discharge instructions discussed at length. The patient/family was given strict return precautions who verbalized understanding of the instructions. No further questions at time of discharge.    ED Discharge Orders         Ordered    diclofenac Sodium (VOLTAREN) 1 % GEL  4 times daily        02/15/21 0216    Lidocaine (HM LIDOCAINE PATCH) 4 % PTCH  Daily        02/15/21 0216           Follow Up: [name], NP [address] [place] [address] [phone]  Call  to schedule an appointment for close follow up, as needed for additional pain management     This chart was dictated using voice recognition software.  Despite best efforts to proofread,  errors can occur which can change the documentation meaning.   Nira Conn, MD 02/15/21 0216    Nira Conn, MD 02/15/21 [phone]

## 2021-02-15 NOTE — Evaluation (Signed)
Physical Therapy Evaluation Patient Details Name: Brittany Weber MRN: [id number] DOB: 10-08-1932 Today's Date: 02/15/2021   History of Present Illness  Pt in ED from home after a fall at home. She has 2 Left rib fractures .  Clinical Impression  Pt assessed while in the ED. Unclear of pt's baseline and if her cognitive status is at baseline. She was Alert and Orientated x4 however her communication was very slow , distractable, and unable to complete the entire thought, so it was hard to get history or understand her PLOF. According to the chart pt was home alone independnet and in this state during assessment, unsure how she could have been functioning alone. Today pt moved slow and with pain in L posterior rib area. Stood edge of bed and attempted unsuccessful forward stepping, but able to step side step 3 times with difficulty progressing LLE. Will continue to follow while here.     Follow Up Recommendations SNF    Equipment Recommendations       Recommendations for Other Services       Precautions / Restrictions Precautions Precautions: Fall Precaution Comments: aware of L rib fractures      Mobility  Bed Mobility Overal bed mobility: Needs Assistance Bed Mobility: Sit to Supine;Supine to Sit     Supine to sit: Mod assist Sit to supine: Max assist;+2 for physical assistance   General bed mobility comments: pt moves very slow , needs increased time, and was difficult to perform bed mobility due to structure of stretcher and very high height of ED streacher.    Transfers Overall transfer level: Needs assistance Equipment used: Rolling walker (2 wheeled) Transfers: Sit to/from Stand Sit to Stand: Mod assist;+2 safety/equipment         General transfer comment: pt was very slow and needed increased time and pain in L ribs when standing.  Ambulation/Gait Ambulation/Gait assistance: Mod assist;+2 safety/equipment Gait Distance (Feet): -1 Feet Assistive device:  Rolling walker (2 wheeled)   Gait velocity: SLOW   General Gait Details: side step only. Pt had great difficulty moving L LE for any wieght shift.  Stairs            Wheelchair Mobility    Modified Rankin (Stroke Patients Only)       Balance Overall balance assessment: Needs assistance Sitting-balance support: Bilateral upper extremity supported;Feet supported Sitting balance-Leahy Scale: Fair     Standing balance support: Bilateral upper extremity supported;During functional activity Standing balance-Leahy Scale: Poor                               Pertinent Vitals/Pain Pain Assessment: Faces Faces Pain Scale: Hurts even more Pain Location: Left poster rib area increased pain when moving ,but pt states it s "better". Also mentioned a sharp pain down R anterior hip area that comes and goes even when lying still in bed. Pain Descriptors / Indicators: Aching;Sore;Sharp Pain Intervention(s): Monitored during session    Home Living Family/patient expects to be discharged to:: Private residence Living Arrangements: Alone Available Help at Discharge: Family;Available PRN/intermittently Type of Home: House Home Access:  (unsure)     Home Layout: One level Home Equipment: Walker - 4 wheels;Bedside commode      Prior Function Level of Independence: Independent with assistive device(s)         Comments: Unsure of details and accuracy of pt report PLOF. Pt was very slow to communicte and express while trying  to gain history and PLOF. Chart states alone, however unclear if she is alwys alone, or has help , sounds like she uses a walker at home currently, not sure if this was becoming more difficult for the patient or not .     Hand Dominance        Extremity/Trunk Assessment        Lower Extremity Assessment Lower Extremity Assessment: Generalized weakness       Communication   Communication: Expressive difficulties (very slow to get complete  thought out and 90% of time lost thought , also very distractable as well.)  Cognition Arousal/Alertness: Awake/alert Behavior During Therapy: WFL for tasks assessed/performed Overall Cognitive Status: No family/caregiver present to determine baseline cognitive functioning                                        General Comments      Exercises     Assessment/Plan    PT Assessment Patient needs continued PT services  PT Problem List Decreased strength;Decreased activity tolerance;Decreased mobility       PT Treatment Interventions Gait training;Functional mobility training;Therapeutic activities;Therapeutic exercise    PT Goals (Current goals can be found in the Care Plan section)  Acute Rehab PT Goals PT Goal Formulation: Patient unable to participate in goal setting Time For Goal Achievement: 03/01/21 Potential to Achieve Goals: Good    Frequency Min 3X/week   Barriers to discharge        Co-evaluation               AM-PAC PT "6 Clicks" Mobility  Outcome Measure Help needed turning from your back to your side while in a flat bed without using bedrails?: A Lot Help needed moving from lying on your back to sitting on the side of a flat bed without using bedrails?: A Lot Help needed moving to and from a bed to a chair (including a wheelchair)?: A Lot Help needed standing up from a chair using your arms (e.g., wheelchair or bedside chair)?: A Lot Help needed to walk in hospital room?: A Lot Help needed climbing 3-5 steps with a railing? : A Lot 6 Click Score: 12    End of Session   Activity Tolerance: Patient limited by pain Patient left: in bed;with call bell/phone within reach;with bed alarm set (ED stretcher) Nurse Communication: Mobility status PT Visit Diagnosis: Unsteadiness on feet (R26.81);Muscle weakness (generalized) (M62.81);History of falling (Z91.81)    Time: 1230-1320 PT Time Calculation (min) (ACUTE ONLY): 50 min   Charges:    PT Evaluation $PT Eval Low Complexity: 1 Low $PT Eval Moderate Complexity: 1 Mod PT Treatments $Therapeutic Activity: 23-37 mins        Jahniya Duzan, PT, MPT Acute Rehabilitation Services Office: [phone] Pager: [phone] 02/15/2021   [name] 02/15/2021, 1:37 PM

## 2021-02-16 DIAGNOSIS — S2242XA Multiple fractures of ribs, left side, initial encounter for closed fracture: Secondary | ICD-10-CM | POA: Diagnosis not present

## 2021-02-16 LAB — RESP PANEL BY RT-PCR (FLU A&B, COVID) ARPGX2
Influenza A by PCR: NEGATIVE
Influenza B by PCR: NEGATIVE
SARS Coronavirus 2 by RT PCR: NEGATIVE

## 2021-02-16 NOTE — Progress Notes (Signed)
TOC CM/CSW spoke with Brittany Weber/pts niece [phone].  CSW updated Brittany on pts bed offers.  Brittany would like to go with [facility] offer.  CSW will continue to follow for dc needs.  Brittany Weber, MSW, LCSW-A Pronouns:  She, Her, Hers                  Brittany Weber ED Transitions of CareClinical Social Worker [email or web address] [phone]

## 2021-02-16 NOTE — ED Notes (Signed)
Staff moved pt from bed to chair

## 2021-02-16 NOTE — Progress Notes (Signed)
TOC CM/CSW spoke with Kenisha/[facility] [phone].  [facility] confirmed acceptance of pt.  Sheliah Hatch will process pts benefits on 02/17/2021 to verify insurance.    CSW will continue to follow for dc needs.  Tauni Sanks Tarpley-Carter, MSW, LCSW-A Pronouns:  She, Her, Hers                  [facility] ED Transitions of CareClinical Social Worker [email or web address] [phone]

## 2021-02-16 NOTE — ED Provider Notes (Signed)
Emergency Medicine Observation Re-evaluation Note  Brittany Weber is a 87 y.o. female, seen on rounds today.  Pt initially presented to the ED for complaints of Fall Currently, the patient is awaiting SNF placement.  Physical Exam  BP 140/66 (BP Location: Left Arm)   Pulse (!) 51   Temp 98 F (36.7 C)   Resp 15   SpO2 100%  Physical Exam General: Calm and cooperative.  Cardiac: Well perfused.  Lungs: even, unlabored respirations.  Psych: Calm.   ED Course / MDM  EKG:    I have reviewed the labs performed to date as well as medications administered while in observation.  Recent changes in the last 24 hours include patient awaiting insurance authorization and SNF placement.  Plan  Current plan is for SNF placement.     Maia Plan, MD 02/16/21 [phone]

## 2021-02-17 DIAGNOSIS — F22 Delusional disorders: Secondary | ICD-10-CM | POA: Diagnosis not present

## 2021-02-17 DIAGNOSIS — I150 Renovascular hypertension: Secondary | ICD-10-CM | POA: Diagnosis not present

## 2021-02-17 DIAGNOSIS — N390 Urinary tract infection, site not specified: Secondary | ICD-10-CM | POA: Diagnosis not present

## 2021-02-17 DIAGNOSIS — I10 Essential (primary) hypertension: Secondary | ICD-10-CM | POA: Diagnosis not present

## 2021-02-17 DIAGNOSIS — N183 Chronic kidney disease, stage 3 unspecified: Secondary | ICD-10-CM | POA: Diagnosis not present

## 2021-02-17 DIAGNOSIS — S2239XA Fracture of one rib, unspecified side, initial encounter for closed fracture: Secondary | ICD-10-CM | POA: Diagnosis not present

## 2021-02-17 DIAGNOSIS — R262 Difficulty in walking, not elsewhere classified: Secondary | ICD-10-CM | POA: Diagnosis not present

## 2021-02-17 DIAGNOSIS — R4189 Other symptoms and signs involving cognitive functions and awareness: Secondary | ICD-10-CM | POA: Diagnosis not present

## 2021-02-17 DIAGNOSIS — M1612 Unilateral primary osteoarthritis, left hip: Secondary | ICD-10-CM | POA: Diagnosis not present

## 2021-02-17 DIAGNOSIS — M1990 Unspecified osteoarthritis, unspecified site: Secondary | ICD-10-CM | POA: Diagnosis not present

## 2021-02-17 DIAGNOSIS — K5901 Slow transit constipation: Secondary | ICD-10-CM | POA: Diagnosis not present

## 2021-02-17 DIAGNOSIS — S2242XA Multiple fractures of ribs, left side, initial encounter for closed fracture: Secondary | ICD-10-CM | POA: Diagnosis not present

## 2021-02-17 DIAGNOSIS — R279 Unspecified lack of coordination: Secondary | ICD-10-CM | POA: Diagnosis not present

## 2021-02-17 DIAGNOSIS — L853 Xerosis cutis: Secondary | ICD-10-CM | POA: Diagnosis not present

## 2021-02-17 DIAGNOSIS — R4182 Altered mental status, unspecified: Secondary | ICD-10-CM | POA: Diagnosis not present

## 2021-02-17 DIAGNOSIS — Z20822 Contact with and (suspected) exposure to covid-19: Secondary | ICD-10-CM | POA: Diagnosis not present

## 2021-02-17 DIAGNOSIS — I7389 Other specified peripheral vascular diseases: Secondary | ICD-10-CM | POA: Diagnosis not present

## 2021-02-17 DIAGNOSIS — Z713 Dietary counseling and surveillance: Secondary | ICD-10-CM | POA: Diagnosis not present

## 2021-02-17 DIAGNOSIS — R1311 Dysphagia, oral phase: Secondary | ICD-10-CM | POA: Diagnosis not present

## 2021-02-17 DIAGNOSIS — Z743 Need for continuous supervision: Secondary | ICD-10-CM | POA: Diagnosis not present

## 2021-02-17 DIAGNOSIS — I739 Peripheral vascular disease, unspecified: Secondary | ICD-10-CM | POA: Diagnosis not present

## 2021-02-17 DIAGNOSIS — S2241XD Multiple fractures of ribs, right side, subsequent encounter for fracture with routine healing: Secondary | ICD-10-CM | POA: Diagnosis not present

## 2021-02-17 DIAGNOSIS — D638 Anemia in other chronic diseases classified elsewhere: Secondary | ICD-10-CM | POA: Diagnosis not present

## 2021-02-17 DIAGNOSIS — Z79899 Other long term (current) drug therapy: Secondary | ICD-10-CM | POA: Diagnosis not present

## 2021-02-17 DIAGNOSIS — D649 Anemia, unspecified: Secondary | ICD-10-CM | POA: Diagnosis not present

## 2021-02-17 DIAGNOSIS — R278 Other lack of coordination: Secondary | ICD-10-CM | POA: Diagnosis not present

## 2021-02-17 DIAGNOSIS — I129 Hypertensive chronic kidney disease with stage 1 through stage 4 chronic kidney disease, or unspecified chronic kidney disease: Secondary | ICD-10-CM | POA: Diagnosis not present

## 2021-02-17 DIAGNOSIS — R2681 Unsteadiness on feet: Secondary | ICD-10-CM | POA: Diagnosis not present

## 2021-02-17 DIAGNOSIS — S2242XD Multiple fractures of ribs, left side, subsequent encounter for fracture with routine healing: Secondary | ICD-10-CM | POA: Diagnosis not present

## 2021-02-17 DIAGNOSIS — E568 Deficiency of other vitamins: Secondary | ICD-10-CM | POA: Diagnosis not present

## 2021-02-17 DIAGNOSIS — R002 Palpitations: Secondary | ICD-10-CM | POA: Diagnosis not present

## 2021-02-17 DIAGNOSIS — Z91040 Latex allergy status: Secondary | ICD-10-CM | POA: Diagnosis not present

## 2021-02-17 DIAGNOSIS — R2689 Other abnormalities of gait and mobility: Secondary | ICD-10-CM | POA: Diagnosis not present

## 2021-02-17 DIAGNOSIS — H409 Unspecified glaucoma: Secondary | ICD-10-CM | POA: Diagnosis not present

## 2021-02-17 DIAGNOSIS — M6281 Muscle weakness (generalized): Secondary | ICD-10-CM | POA: Diagnosis not present

## 2021-02-17 DIAGNOSIS — R41841 Cognitive communication deficit: Secondary | ICD-10-CM | POA: Diagnosis not present

## 2021-02-17 DIAGNOSIS — Z889 Allergy status to unspecified drugs, medicaments and biological substances status: Secondary | ICD-10-CM | POA: Diagnosis not present

## 2021-02-17 DIAGNOSIS — Z789 Other specified health status: Secondary | ICD-10-CM | POA: Diagnosis not present

## 2021-02-17 DIAGNOSIS — M25552 Pain in left hip: Secondary | ICD-10-CM | POA: Diagnosis not present

## 2021-02-17 DIAGNOSIS — R54 Age-related physical debility: Secondary | ICD-10-CM | POA: Diagnosis not present

## 2021-02-17 DIAGNOSIS — R5381 Other malaise: Secondary | ICD-10-CM | POA: Diagnosis not present

## 2021-02-17 DIAGNOSIS — D6489 Other specified anemias: Secondary | ICD-10-CM | POA: Diagnosis not present

## 2021-02-17 DIAGNOSIS — M899 Disorder of bone, unspecified: Secondary | ICD-10-CM | POA: Diagnosis not present

## 2021-02-17 MED ORDER — ACETAMINOPHEN 325 MG PO TABS
650.0000 mg | ORAL_TABLET | ORAL | Status: DC | PRN
  Administered 2021-02-17: 650 mg via ORAL
  Filled 2021-02-17: qty 2

## 2021-02-17 NOTE — ED Notes (Signed)
Patient given meal tray.

## 2021-02-17 NOTE — Progress Notes (Addendum)
TOC CM/CSW contacted Star/[facility].  Star stated that this [facility] is primary on pts account Medicare needs to be primary.  CSW contacted [name] Dubose/pts niece [phone].    MCR#:  [phone]  Policy#:  [id number]  CSW will continue to follow for dc needs.  Brittany Weber, MSW, LCSW-A Pronouns:  She, Her, Hers                  Brittany Weber ED Transitions of CareClinical Social Worker [email or web address] [phone]

## 2021-02-17 NOTE — ED Notes (Signed)
Patient given meal tray however she did not want anything on it.

## 2021-02-17 NOTE — ED Notes (Addendum)
Patient resting comfortably watching TV  External female purewick attached to patient Bottle water given

## 2021-02-17 NOTE — Progress Notes (Addendum)
TOC CM/CSW contacted Brittany Weber/pts niece [phone] to share the information [facility] had shared with CSW so she could pursue this matter.  MCR#:  [phone]  Policy#:  [id number]  CSW will continue to follow for dc needs.  Brittany Weber, MSW, LCSW-A Pronouns:  She, Her, Hers                  [facility] ED Transitions of CareClinical Social Worker [email or web address] [phone]
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
## 2021-02-17 NOTE — Progress Notes (Signed)
PT Cancellation Note  Patient Details Name: Brittany Weber MRN: [id number] DOB: [birth year]   Cancelled Treatment:    Reason Eval/Treat Not Completed: Other (comment)pt was eating when PT checked on patient.  Will check back in AM.   Rada Hay 02/17/2021, 4:57 PM  Blanchard Kelch PT Acute Rehabilitation Services Pager [phone] Office [phone]

## 2021-02-17 NOTE — ED Notes (Addendum)
Report given to Wandra Scot at [facility]. Pt just waiting for PTAR for transport.

## 2021-02-18 DIAGNOSIS — I10 Essential (primary) hypertension: Secondary | ICD-10-CM | POA: Diagnosis not present

## 2021-02-18 DIAGNOSIS — S2242XA Multiple fractures of ribs, left side, initial encounter for closed fracture: Secondary | ICD-10-CM | POA: Diagnosis not present

## 2021-02-18 DIAGNOSIS — D638 Anemia in other chronic diseases classified elsewhere: Secondary | ICD-10-CM | POA: Diagnosis not present

## 2021-02-18 DIAGNOSIS — M1612 Unilateral primary osteoarthritis, left hip: Secondary | ICD-10-CM | POA: Diagnosis not present

## 2021-02-18 DIAGNOSIS — K5901 Slow transit constipation: Secondary | ICD-10-CM | POA: Diagnosis not present

## 2021-02-18 DIAGNOSIS — Z889 Allergy status to unspecified drugs, medicaments and biological substances status: Secondary | ICD-10-CM | POA: Diagnosis not present

## 2021-02-18 DIAGNOSIS — Z789 Other specified health status: Secondary | ICD-10-CM | POA: Diagnosis not present

## 2021-02-18 DIAGNOSIS — L853 Xerosis cutis: Secondary | ICD-10-CM | POA: Diagnosis not present

## 2021-02-18 DIAGNOSIS — I7389 Other specified peripheral vascular diseases: Secondary | ICD-10-CM | POA: Diagnosis not present

## 2021-02-18 DIAGNOSIS — R262 Difficulty in walking, not elsewhere classified: Secondary | ICD-10-CM | POA: Diagnosis not present

## 2021-02-20 DIAGNOSIS — M6281 Muscle weakness (generalized): Secondary | ICD-10-CM | POA: Diagnosis not present

## 2021-02-20 DIAGNOSIS — H409 Unspecified glaucoma: Secondary | ICD-10-CM | POA: Diagnosis not present

## 2021-02-20 DIAGNOSIS — R2681 Unsteadiness on feet: Secondary | ICD-10-CM | POA: Diagnosis not present

## 2021-02-20 DIAGNOSIS — I739 Peripheral vascular disease, unspecified: Secondary | ICD-10-CM | POA: Diagnosis not present

## 2021-02-20 DIAGNOSIS — I10 Essential (primary) hypertension: Secondary | ICD-10-CM | POA: Diagnosis not present

## 2021-02-20 DIAGNOSIS — S2239XA Fracture of one rib, unspecified side, initial encounter for closed fracture: Secondary | ICD-10-CM | POA: Diagnosis not present

## 2021-02-20 DIAGNOSIS — M1990 Unspecified osteoarthritis, unspecified site: Secondary | ICD-10-CM | POA: Diagnosis not present

## 2021-02-20 DIAGNOSIS — M899 Disorder of bone, unspecified: Secondary | ICD-10-CM | POA: Diagnosis not present

## 2021-02-20 DIAGNOSIS — D649 Anemia, unspecified: Secondary | ICD-10-CM | POA: Diagnosis not present

## 2021-02-21 DIAGNOSIS — I129 Hypertensive chronic kidney disease with stage 1 through stage 4 chronic kidney disease, or unspecified chronic kidney disease: Secondary | ICD-10-CM | POA: Diagnosis not present

## 2021-02-21 DIAGNOSIS — D6489 Other specified anemias: Secondary | ICD-10-CM | POA: Diagnosis not present

## 2021-02-21 DIAGNOSIS — N183 Chronic kidney disease, stage 3 unspecified: Secondary | ICD-10-CM | POA: Diagnosis not present

## 2021-02-24 ENCOUNTER — Other Ambulatory Visit: Payer: Self-pay | Admitting: *Deleted

## 2021-02-24 DIAGNOSIS — M899 Disorder of bone, unspecified: Secondary | ICD-10-CM | POA: Diagnosis not present

## 2021-02-24 DIAGNOSIS — D649 Anemia, unspecified: Secondary | ICD-10-CM | POA: Diagnosis not present

## 2021-02-24 DIAGNOSIS — I739 Peripheral vascular disease, unspecified: Secondary | ICD-10-CM | POA: Diagnosis not present

## 2021-02-24 DIAGNOSIS — M1990 Unspecified osteoarthritis, unspecified site: Secondary | ICD-10-CM | POA: Diagnosis not present

## 2021-02-24 DIAGNOSIS — H409 Unspecified glaucoma: Secondary | ICD-10-CM | POA: Diagnosis not present

## 2021-02-24 DIAGNOSIS — I10 Essential (primary) hypertension: Secondary | ICD-10-CM | POA: Diagnosis not present

## 2021-02-24 DIAGNOSIS — R2681 Unsteadiness on feet: Secondary | ICD-10-CM | POA: Diagnosis not present

## 2021-02-24 DIAGNOSIS — M6281 Muscle weakness (generalized): Secondary | ICD-10-CM | POA: Diagnosis not present

## 2021-02-24 DIAGNOSIS — S2239XA Fracture of one rib, unspecified side, initial encounter for closed fracture: Secondary | ICD-10-CM | POA: Diagnosis not present

## 2021-02-24 NOTE — Patient Outreach (Signed)
Member screened for potential [facility] Care Management needs.  Communication sent to [facility] SN[facility] SW to inquire about transition plans.    Leston Schueller, MSN, RN,BSN [facility] Post Acute Care Coordinator [phone] ( [employer] Mobile) [phone]  (Toll free office)  

## 2021-02-25 ENCOUNTER — Other Ambulatory Visit: Payer: Self-pay | Admitting: *Deleted

## 2021-02-25 NOTE — Patient Outreach (Signed)
[facility] Post-Acute Care Coordinator follow up. Member screened for potential [facility] Care Management needs.  Update received from [facility] SW indicating member's niece has been inquiring about CAPS or aide services. Ms. Dusek lived alone prior. States family was looking into ALF prior to admission. However, unsure how much care member will need upon SNF transition.  Will continue to follow for potential [facility] services and transition plans.    Raiford Noble, MSN, RN,BSN [facility] Post Acute Care Coordinator [phone] [facility]) [phone]  (Toll free office)

## 2021-02-27 DIAGNOSIS — S2239XA Fracture of one rib, unspecified side, initial encounter for closed fracture: Secondary | ICD-10-CM | POA: Diagnosis not present

## 2021-02-27 DIAGNOSIS — I10 Essential (primary) hypertension: Secondary | ICD-10-CM | POA: Diagnosis not present

## 2021-02-27 DIAGNOSIS — M1990 Unspecified osteoarthritis, unspecified site: Secondary | ICD-10-CM | POA: Diagnosis not present

## 2021-02-27 DIAGNOSIS — D649 Anemia, unspecified: Secondary | ICD-10-CM | POA: Diagnosis not present

## 2021-02-27 DIAGNOSIS — I739 Peripheral vascular disease, unspecified: Secondary | ICD-10-CM | POA: Diagnosis not present

## 2021-02-27 DIAGNOSIS — R2681 Unsteadiness on feet: Secondary | ICD-10-CM | POA: Diagnosis not present

## 2021-02-27 DIAGNOSIS — M6281 Muscle weakness (generalized): Secondary | ICD-10-CM | POA: Diagnosis not present

## 2021-02-27 DIAGNOSIS — M899 Disorder of bone, unspecified: Secondary | ICD-10-CM | POA: Diagnosis not present

## 2021-02-27 DIAGNOSIS — H409 Unspecified glaucoma: Secondary | ICD-10-CM | POA: Diagnosis not present

## 2021-03-03 DIAGNOSIS — D649 Anemia, unspecified: Secondary | ICD-10-CM | POA: Diagnosis not present

## 2021-03-03 DIAGNOSIS — M6281 Muscle weakness (generalized): Secondary | ICD-10-CM | POA: Diagnosis not present

## 2021-03-03 DIAGNOSIS — M1990 Unspecified osteoarthritis, unspecified site: Secondary | ICD-10-CM | POA: Diagnosis not present

## 2021-03-03 DIAGNOSIS — M899 Disorder of bone, unspecified: Secondary | ICD-10-CM | POA: Diagnosis not present

## 2021-03-03 DIAGNOSIS — S2239XA Fracture of one rib, unspecified side, initial encounter for closed fracture: Secondary | ICD-10-CM | POA: Diagnosis not present

## 2021-03-03 DIAGNOSIS — I10 Essential (primary) hypertension: Secondary | ICD-10-CM | POA: Diagnosis not present

## 2021-03-03 DIAGNOSIS — R2681 Unsteadiness on feet: Secondary | ICD-10-CM | POA: Diagnosis not present

## 2021-03-03 DIAGNOSIS — H409 Unspecified glaucoma: Secondary | ICD-10-CM | POA: Diagnosis not present

## 2021-03-03 DIAGNOSIS — I739 Peripheral vascular disease, unspecified: Secondary | ICD-10-CM | POA: Diagnosis not present

## 2021-03-05 ENCOUNTER — Other Ambulatory Visit: Payer: Self-pay | Admitting: *Deleted

## 2021-03-05 NOTE — Patient Outreach (Signed)
[facility] Post- Acute Care Coordinator follow up. Member screened for potential [facility] Care Management needs.  Update received from [facility] SNF SW indicating member's niece is leaning more towards home with help upon SNF transition. CAPS application has been submitted. Anticipated transition date not known at this time.   Will continue to follow for potential [facility] services.   Raiford Noble, MSN, RN,BSN [facility] Post Acute Care Coordinator [phone] [facility]) [phone]  (Toll free office)

## 2021-03-10 DIAGNOSIS — M899 Disorder of bone, unspecified: Secondary | ICD-10-CM | POA: Diagnosis not present

## 2021-03-10 DIAGNOSIS — S2239XA Fracture of one rib, unspecified side, initial encounter for closed fracture: Secondary | ICD-10-CM | POA: Diagnosis not present

## 2021-03-10 DIAGNOSIS — M6281 Muscle weakness (generalized): Secondary | ICD-10-CM | POA: Diagnosis not present

## 2021-03-10 DIAGNOSIS — R2681 Unsteadiness on feet: Secondary | ICD-10-CM | POA: Diagnosis not present

## 2021-03-10 DIAGNOSIS — I739 Peripheral vascular disease, unspecified: Secondary | ICD-10-CM | POA: Diagnosis not present

## 2021-03-10 DIAGNOSIS — H409 Unspecified glaucoma: Secondary | ICD-10-CM | POA: Diagnosis not present

## 2021-03-10 DIAGNOSIS — I10 Essential (primary) hypertension: Secondary | ICD-10-CM | POA: Diagnosis not present

## 2021-03-10 DIAGNOSIS — M1990 Unspecified osteoarthritis, unspecified site: Secondary | ICD-10-CM | POA: Diagnosis not present

## 2021-03-10 DIAGNOSIS — D649 Anemia, unspecified: Secondary | ICD-10-CM | POA: Diagnosis not present

## 2021-03-11 DIAGNOSIS — M1612 Unilateral primary osteoarthritis, left hip: Secondary | ICD-10-CM | POA: Diagnosis not present

## 2021-03-11 DIAGNOSIS — N183 Chronic kidney disease, stage 3 unspecified: Secondary | ICD-10-CM | POA: Diagnosis not present

## 2021-03-11 DIAGNOSIS — I10 Essential (primary) hypertension: Secondary | ICD-10-CM | POA: Diagnosis not present

## 2021-03-11 DIAGNOSIS — I7389 Other specified peripheral vascular diseases: Secondary | ICD-10-CM | POA: Diagnosis not present

## 2021-03-11 DIAGNOSIS — S2241XD Multiple fractures of ribs, right side, subsequent encounter for fracture with routine healing: Secondary | ICD-10-CM | POA: Diagnosis not present

## 2021-03-11 DIAGNOSIS — K5901 Slow transit constipation: Secondary | ICD-10-CM | POA: Diagnosis not present

## 2021-03-11 DIAGNOSIS — D638 Anemia in other chronic diseases classified elsewhere: Secondary | ICD-10-CM | POA: Diagnosis not present

## 2021-03-13 DIAGNOSIS — H409 Unspecified glaucoma: Secondary | ICD-10-CM | POA: Diagnosis not present

## 2021-03-13 DIAGNOSIS — M6281 Muscle weakness (generalized): Secondary | ICD-10-CM | POA: Diagnosis not present

## 2021-03-13 DIAGNOSIS — I10 Essential (primary) hypertension: Secondary | ICD-10-CM | POA: Diagnosis not present

## 2021-03-13 DIAGNOSIS — M899 Disorder of bone, unspecified: Secondary | ICD-10-CM | POA: Diagnosis not present

## 2021-03-13 DIAGNOSIS — I739 Peripheral vascular disease, unspecified: Secondary | ICD-10-CM | POA: Diagnosis not present

## 2021-03-13 DIAGNOSIS — S2239XA Fracture of one rib, unspecified side, initial encounter for closed fracture: Secondary | ICD-10-CM | POA: Diagnosis not present

## 2021-03-13 DIAGNOSIS — D649 Anemia, unspecified: Secondary | ICD-10-CM | POA: Diagnosis not present

## 2021-03-13 DIAGNOSIS — R2681 Unsteadiness on feet: Secondary | ICD-10-CM | POA: Diagnosis not present

## 2021-03-13 DIAGNOSIS — M1990 Unspecified osteoarthritis, unspecified site: Secondary | ICD-10-CM | POA: Diagnosis not present

## 2021-03-17 DIAGNOSIS — M899 Disorder of bone, unspecified: Secondary | ICD-10-CM | POA: Diagnosis not present

## 2021-03-17 DIAGNOSIS — H409 Unspecified glaucoma: Secondary | ICD-10-CM | POA: Diagnosis not present

## 2021-03-17 DIAGNOSIS — S2239XA Fracture of one rib, unspecified side, initial encounter for closed fracture: Secondary | ICD-10-CM | POA: Diagnosis not present

## 2021-03-17 DIAGNOSIS — D649 Anemia, unspecified: Secondary | ICD-10-CM | POA: Diagnosis not present

## 2021-03-17 DIAGNOSIS — R2681 Unsteadiness on feet: Secondary | ICD-10-CM | POA: Diagnosis not present

## 2021-03-17 DIAGNOSIS — M1990 Unspecified osteoarthritis, unspecified site: Secondary | ICD-10-CM | POA: Diagnosis not present

## 2021-03-17 DIAGNOSIS — M6281 Muscle weakness (generalized): Secondary | ICD-10-CM | POA: Diagnosis not present

## 2021-03-17 DIAGNOSIS — I739 Peripheral vascular disease, unspecified: Secondary | ICD-10-CM | POA: Diagnosis not present

## 2021-03-17 DIAGNOSIS — I10 Essential (primary) hypertension: Secondary | ICD-10-CM | POA: Diagnosis not present

## 2021-03-19 ENCOUNTER — Other Ambulatory Visit: Payer: Self-pay | Admitting: *Deleted

## 2021-03-19 NOTE — Patient Outreach (Signed)
[facility] Post- Acute Care Coordinator follow up. Member screened for potential [facility] Care Management needs.  Update received from [facility] SW indicating facility is recommending ALF but Ms. Kitner wants to return home. CAPS application is in process. Ms. Friis lives alone. Her niece [name] is primary contact. Transition date not set yet.   Will continue to follow and plan outreach closer to SNF transition.    Raiford Noble, MSN, RN,BSN [facility] Post Acute Care Coordinator [phone] [facility]) [phone]  (Toll free office)

## 2021-03-20 DIAGNOSIS — R54 Age-related physical debility: Secondary | ICD-10-CM | POA: Diagnosis not present

## 2021-03-20 DIAGNOSIS — R4189 Other symptoms and signs involving cognitive functions and awareness: Secondary | ICD-10-CM | POA: Diagnosis not present

## 2021-03-20 DIAGNOSIS — R262 Difficulty in walking, not elsewhere classified: Secondary | ICD-10-CM | POA: Diagnosis not present

## 2021-03-24 DIAGNOSIS — R2681 Unsteadiness on feet: Secondary | ICD-10-CM | POA: Diagnosis not present

## 2021-03-24 DIAGNOSIS — H409 Unspecified glaucoma: Secondary | ICD-10-CM | POA: Diagnosis not present

## 2021-03-24 DIAGNOSIS — D649 Anemia, unspecified: Secondary | ICD-10-CM | POA: Diagnosis not present

## 2021-03-24 DIAGNOSIS — I10 Essential (primary) hypertension: Secondary | ICD-10-CM | POA: Diagnosis not present

## 2021-03-24 DIAGNOSIS — S2239XA Fracture of one rib, unspecified side, initial encounter for closed fracture: Secondary | ICD-10-CM | POA: Diagnosis not present

## 2021-03-24 DIAGNOSIS — M1990 Unspecified osteoarthritis, unspecified site: Secondary | ICD-10-CM | POA: Diagnosis not present

## 2021-03-24 DIAGNOSIS — I739 Peripheral vascular disease, unspecified: Secondary | ICD-10-CM | POA: Diagnosis not present

## 2021-03-24 DIAGNOSIS — M6281 Muscle weakness (generalized): Secondary | ICD-10-CM | POA: Diagnosis not present

## 2021-03-24 DIAGNOSIS — M899 Disorder of bone, unspecified: Secondary | ICD-10-CM | POA: Diagnosis not present

## 2021-03-25 DIAGNOSIS — K5901 Slow transit constipation: Secondary | ICD-10-CM | POA: Diagnosis not present

## 2021-03-25 DIAGNOSIS — F22 Delusional disorders: Secondary | ICD-10-CM | POA: Diagnosis not present

## 2021-03-25 DIAGNOSIS — S2242XD Multiple fractures of ribs, left side, subsequent encounter for fracture with routine healing: Secondary | ICD-10-CM | POA: Diagnosis not present

## 2021-03-25 DIAGNOSIS — R4182 Altered mental status, unspecified: Secondary | ICD-10-CM | POA: Diagnosis not present

## 2021-03-26 DIAGNOSIS — E568 Deficiency of other vitamins: Secondary | ICD-10-CM | POA: Diagnosis not present

## 2021-03-26 DIAGNOSIS — F22 Delusional disorders: Secondary | ICD-10-CM | POA: Diagnosis not present

## 2021-03-26 DIAGNOSIS — I10 Essential (primary) hypertension: Secondary | ICD-10-CM | POA: Diagnosis not present

## 2021-03-26 DIAGNOSIS — R4189 Other symptoms and signs involving cognitive functions and awareness: Secondary | ICD-10-CM | POA: Diagnosis not present

## 2021-03-26 DIAGNOSIS — K5901 Slow transit constipation: Secondary | ICD-10-CM | POA: Diagnosis not present

## 2021-03-27 DIAGNOSIS — S2239XA Fracture of one rib, unspecified side, initial encounter for closed fracture: Secondary | ICD-10-CM | POA: Diagnosis not present

## 2021-03-27 DIAGNOSIS — H409 Unspecified glaucoma: Secondary | ICD-10-CM | POA: Diagnosis not present

## 2021-03-27 DIAGNOSIS — R2681 Unsteadiness on feet: Secondary | ICD-10-CM | POA: Diagnosis not present

## 2021-03-27 DIAGNOSIS — I739 Peripheral vascular disease, unspecified: Secondary | ICD-10-CM | POA: Diagnosis not present

## 2021-03-27 DIAGNOSIS — I10 Essential (primary) hypertension: Secondary | ICD-10-CM | POA: Diagnosis not present

## 2021-03-27 DIAGNOSIS — M1990 Unspecified osteoarthritis, unspecified site: Secondary | ICD-10-CM | POA: Diagnosis not present

## 2021-03-27 DIAGNOSIS — D649 Anemia, unspecified: Secondary | ICD-10-CM | POA: Diagnosis not present

## 2021-03-27 DIAGNOSIS — M899 Disorder of bone, unspecified: Secondary | ICD-10-CM | POA: Diagnosis not present

## 2021-03-27 DIAGNOSIS — M6281 Muscle weakness (generalized): Secondary | ICD-10-CM | POA: Diagnosis not present

## 2021-03-31 DIAGNOSIS — S2239XA Fracture of one rib, unspecified side, initial encounter for closed fracture: Secondary | ICD-10-CM | POA: Diagnosis not present

## 2021-03-31 DIAGNOSIS — M6281 Muscle weakness (generalized): Secondary | ICD-10-CM | POA: Diagnosis not present

## 2021-03-31 DIAGNOSIS — I739 Peripheral vascular disease, unspecified: Secondary | ICD-10-CM | POA: Diagnosis not present

## 2021-03-31 DIAGNOSIS — D649 Anemia, unspecified: Secondary | ICD-10-CM | POA: Diagnosis not present

## 2021-03-31 DIAGNOSIS — I10 Essential (primary) hypertension: Secondary | ICD-10-CM | POA: Diagnosis not present

## 2021-03-31 DIAGNOSIS — H409 Unspecified glaucoma: Secondary | ICD-10-CM | POA: Diagnosis not present

## 2021-03-31 DIAGNOSIS — R2681 Unsteadiness on feet: Secondary | ICD-10-CM | POA: Diagnosis not present

## 2021-03-31 DIAGNOSIS — M899 Disorder of bone, unspecified: Secondary | ICD-10-CM | POA: Diagnosis not present

## 2021-03-31 DIAGNOSIS — M1990 Unspecified osteoarthritis, unspecified site: Secondary | ICD-10-CM | POA: Diagnosis not present

## 2021-04-01 ENCOUNTER — Other Ambulatory Visit: Payer: Self-pay | Admitting: *Deleted

## 2021-04-01 DIAGNOSIS — N390 Urinary tract infection, site not specified: Secondary | ICD-10-CM | POA: Diagnosis not present

## 2021-04-01 NOTE — Patient Outreach (Signed)
[facility] Post- Acute Care Coordinator follow up. Member screened for potential [facility] Care Management needs.  Ms. Folmer resides in [facility]. Communication sent to SNF SW to inquire about updated transition plans.   Will continue to follow for potential [facility] Care Management services.    Raiford Noble, MSN, RN,BSN [facility] Post Acute Care Coordinator [phone] [facility]) [phone]  (Toll free office)

## 2021-04-03 ENCOUNTER — Other Ambulatory Visit: Payer: Self-pay | Admitting: *Deleted

## 2021-04-03 NOTE — Patient Outreach (Signed)
[facility] Post- Acute Care Coordinator follow up. Member screened for potential [facility] Care Management needs.  Ms. Kennerson resides in [facility]. Update received from SNF SW indicating family plans for Ms. Hark to return home. States facility has emphasized the need for caregivers in the home post SNF. SW reports family is discussing a plan amongst themselves. Likely transition soon.  Will continue to follow while member resides in SNF.     Raiford Noble, MSN, RN,BSN [facility] Post Acute Care Coordinator [phone] [facility]) [phone]  (Toll free office)

## 2021-04-08 ENCOUNTER — Other Ambulatory Visit: Payer: Self-pay | Admitting: *Deleted

## 2021-04-08 NOTE — Patient Outreach (Signed)
[facility] Post- Acute Care Coordinator follow up. Member screened for potential [facility] Care Management needs.  Ms. Kemp resides in [facility]. Communication sent to facility SW to inquire about transition date and primary family contact for Mrs. Beckstrom.   Will plan outreach to discuss [facility] Care Management services.    Raiford Noble, MSN, RN,BSN [facility] Post Acute Care Coordinator [phone] [facility]) [phone]  (Toll free office)

## 2021-04-09 DIAGNOSIS — Z79899 Other long term (current) drug therapy: Secondary | ICD-10-CM | POA: Diagnosis not present

## 2021-04-09 DIAGNOSIS — R4189 Other symptoms and signs involving cognitive functions and awareness: Secondary | ICD-10-CM | POA: Diagnosis not present

## 2021-04-09 DIAGNOSIS — D638 Anemia in other chronic diseases classified elsewhere: Secondary | ICD-10-CM | POA: Diagnosis not present

## 2021-04-09 DIAGNOSIS — S2242XD Multiple fractures of ribs, left side, subsequent encounter for fracture with routine healing: Secondary | ICD-10-CM | POA: Diagnosis not present

## 2021-04-09 DIAGNOSIS — I10 Essential (primary) hypertension: Secondary | ICD-10-CM | POA: Diagnosis not present

## 2021-04-10 DIAGNOSIS — I739 Peripheral vascular disease, unspecified: Secondary | ICD-10-CM | POA: Diagnosis not present

## 2021-04-10 DIAGNOSIS — M899 Disorder of bone, unspecified: Secondary | ICD-10-CM | POA: Diagnosis not present

## 2021-04-10 DIAGNOSIS — Z713 Dietary counseling and surveillance: Secondary | ICD-10-CM | POA: Diagnosis not present

## 2021-04-10 DIAGNOSIS — H409 Unspecified glaucoma: Secondary | ICD-10-CM | POA: Diagnosis not present

## 2021-04-10 DIAGNOSIS — S2239XA Fracture of one rib, unspecified side, initial encounter for closed fracture: Secondary | ICD-10-CM | POA: Diagnosis not present

## 2021-04-10 DIAGNOSIS — R262 Difficulty in walking, not elsewhere classified: Secondary | ICD-10-CM | POA: Diagnosis not present

## 2021-04-10 DIAGNOSIS — M1990 Unspecified osteoarthritis, unspecified site: Secondary | ICD-10-CM | POA: Diagnosis not present

## 2021-04-10 DIAGNOSIS — M6281 Muscle weakness (generalized): Secondary | ICD-10-CM | POA: Diagnosis not present

## 2021-04-10 DIAGNOSIS — I150 Renovascular hypertension: Secondary | ICD-10-CM | POA: Diagnosis not present

## 2021-04-10 DIAGNOSIS — R2681 Unsteadiness on feet: Secondary | ICD-10-CM | POA: Diagnosis not present

## 2021-04-10 DIAGNOSIS — I10 Essential (primary) hypertension: Secondary | ICD-10-CM | POA: Diagnosis not present

## 2021-04-10 DIAGNOSIS — D649 Anemia, unspecified: Secondary | ICD-10-CM | POA: Diagnosis not present

## 2021-04-14 DIAGNOSIS — I10 Essential (primary) hypertension: Secondary | ICD-10-CM | POA: Diagnosis not present

## 2021-04-14 DIAGNOSIS — M1990 Unspecified osteoarthritis, unspecified site: Secondary | ICD-10-CM | POA: Diagnosis not present

## 2021-04-14 DIAGNOSIS — H409 Unspecified glaucoma: Secondary | ICD-10-CM | POA: Diagnosis not present

## 2021-04-14 DIAGNOSIS — S2239XA Fracture of one rib, unspecified side, initial encounter for closed fracture: Secondary | ICD-10-CM | POA: Diagnosis not present

## 2021-04-14 DIAGNOSIS — I739 Peripheral vascular disease, unspecified: Secondary | ICD-10-CM | POA: Diagnosis not present

## 2021-04-14 DIAGNOSIS — M899 Disorder of bone, unspecified: Secondary | ICD-10-CM | POA: Diagnosis not present

## 2021-04-14 DIAGNOSIS — R2681 Unsteadiness on feet: Secondary | ICD-10-CM | POA: Diagnosis not present

## 2021-04-14 DIAGNOSIS — M6281 Muscle weakness (generalized): Secondary | ICD-10-CM | POA: Diagnosis not present

## 2021-04-14 DIAGNOSIS — D649 Anemia, unspecified: Secondary | ICD-10-CM | POA: Diagnosis not present

## 2021-04-21 DIAGNOSIS — R2681 Unsteadiness on feet: Secondary | ICD-10-CM | POA: Diagnosis not present

## 2021-04-21 DIAGNOSIS — M899 Disorder of bone, unspecified: Secondary | ICD-10-CM | POA: Diagnosis not present

## 2021-04-21 DIAGNOSIS — M1990 Unspecified osteoarthritis, unspecified site: Secondary | ICD-10-CM | POA: Diagnosis not present

## 2021-04-21 DIAGNOSIS — I739 Peripheral vascular disease, unspecified: Secondary | ICD-10-CM | POA: Diagnosis not present

## 2021-04-21 DIAGNOSIS — M6281 Muscle weakness (generalized): Secondary | ICD-10-CM | POA: Diagnosis not present

## 2021-04-21 DIAGNOSIS — S2239XA Fracture of one rib, unspecified side, initial encounter for closed fracture: Secondary | ICD-10-CM | POA: Diagnosis not present

## 2021-04-21 DIAGNOSIS — H409 Unspecified glaucoma: Secondary | ICD-10-CM | POA: Diagnosis not present

## 2021-04-21 DIAGNOSIS — D649 Anemia, unspecified: Secondary | ICD-10-CM | POA: Diagnosis not present

## 2021-04-21 DIAGNOSIS — I10 Essential (primary) hypertension: Secondary | ICD-10-CM | POA: Diagnosis not present

## 2021-04-24 ENCOUNTER — Other Ambulatory Visit: Payer: Self-pay | Admitting: *Deleted

## 2021-04-24 ENCOUNTER — Emergency Department (HOSPITAL_COMMUNITY)
Admission: EM | Admit: 2021-04-24 | Discharge: 2021-04-25 | Disposition: A | Discharge status: Home / Self Care | Payer: Medicare Other | Attending: Emergency Medicine | Admitting: Emergency Medicine

## 2021-04-24 ENCOUNTER — Encounter (HOSPITAL_COMMUNITY): Payer: Self-pay | Admitting: Emergency Medicine

## 2021-04-24 ENCOUNTER — Emergency Department (HOSPITAL_COMMUNITY): Payer: Medicare Other

## 2021-04-24 ENCOUNTER — Other Ambulatory Visit: Payer: Self-pay

## 2021-04-24 DIAGNOSIS — Z043 Encounter for examination and observation following other accident: Secondary | ICD-10-CM | POA: Diagnosis not present

## 2021-04-24 DIAGNOSIS — Z91040 Latex allergy status: Secondary | ICD-10-CM | POA: Insufficient documentation

## 2021-04-24 DIAGNOSIS — T8484XA Pain due to internal orthopedic prosthetic devices, implants and grafts, initial encounter: Secondary | ICD-10-CM

## 2021-04-24 DIAGNOSIS — M25551 Pain in right hip: Secondary | ICD-10-CM | POA: Diagnosis not present

## 2021-04-24 DIAGNOSIS — W050XXA Fall from non-moving wheelchair, initial encounter: Secondary | ICD-10-CM | POA: Diagnosis not present

## 2021-04-24 DIAGNOSIS — I10 Essential (primary) hypertension: Secondary | ICD-10-CM | POA: Insufficient documentation

## 2021-04-24 DIAGNOSIS — M1611 Unilateral primary osteoarthritis, right hip: Secondary | ICD-10-CM | POA: Diagnosis not present

## 2021-04-24 DIAGNOSIS — W19XXXA Unspecified fall, initial encounter: Secondary | ICD-10-CM | POA: Diagnosis not present

## 2021-04-24 DIAGNOSIS — Z96649 Presence of unspecified artificial hip joint: Secondary | ICD-10-CM

## 2021-04-24 NOTE — Discharge Instructions (Addendum)
You have been seen and discharged from the emergency department.  Follow-up with your primary provider for reevaluation and further care. Take home medications as prescribed. If you have any worsening symptoms or further concerns for your health please return to an emergency department for further evaluation. 

## 2021-04-24 NOTE — ED Notes (Signed)
PTAR contacted for transport back to [facility]

## 2021-04-24 NOTE — ED Provider Notes (Signed)
[facility]-EMERGENCY DEPT Provider Note   CSN: [id number] Arrival date & time: 04/24/21  1931     History Chief Complaint  Patient presents with  . Fall    Slide out of wheel chair    Brittany Weber is a 87 y.o. female.  HPI   87 year old female presents the emergency department with right hip pain.  Patient states that she accidentally slid down the front of her wheelchair onto the floor.  She states she lowered herself down onto her right hip.  No head injury, no loss of consciousness, no other traumatic injury.  She states initially she had right hip pain but currently that is resolved.  Past Medical History:  Diagnosis Date  . Anemia of renal disease 10/22/2011  . Anemia, iron deficiency 10/22/2011  . Cataract     Patient Active Problem List   Diagnosis Date Noted  . Pain due to onychomycosis of toenails of both feet 06/13/2019  . Coagulation disorder (HCC) 06/13/2019  . Glaucoma 12/25/2017  . Peripheral vascular disease (HCC) 06/28/2015  . Anemia of renal disease 10/22/2011  . Anemia, iron deficiency 10/22/2011  . DISORDER, THYROID NOS 07/20/2007  . Pancytopenia (HCC) 07/20/2007  . ANEMIA-NOS 07/20/2007  . HYPERTENSION 07/20/2007  . ALLERGIC RHINITIS 07/20/2007  . TMJ SYNDROME 07/20/2007  . DEGENERATIVE DISC DISEASE 07/20/2007  . OSTEOPENIA 07/20/2007    Past Surgical History:  Procedure Laterality Date  . ABDOMINAL HYSTERECTOMY    . APPENDECTOMY       OB History   No obstetric history on file.     Family History  Problem Relation Age of Onset  . Diabetes Father   . Diabetes Mother   . Kidney disease Mother     Social History   Tobacco Use  . Smoking status: Never Smoker  . Smokeless tobacco: Never Used  . Tobacco comment: never used tobacco  Vaping Use  . Vaping Use: Never used  Substance Use Topics  . Alcohol use: No    Alcohol/week: 0.0 standard drinks  . Drug use: No    Home Medications Prior to Admission  medications   Medication Sig Start Date End Date Taking? Authorizing Provider  Cholecalciferol (VITAMIN D-3 PO) Take by mouth every morning.    [provider]  diclofenac Sodium (VOLTAREN) 1 % GEL Apply 2 g topically 4 (four) times daily. 02/15/21   Nira Conn, MD  folic acid (FOLVITE) 1 MG tablet TAKE 1 TABLET BY MOUTH TWICE DAILY. Patient not taking: Reported on 02/15/2021 08/19/20   [name], MD  Lidocaine (HM LIDOCAINE PATCH) 4 % PTCH Apply 1 each topically daily. 02/15/21   Cardama, Amadeo Garnet, MD  Misc Natural Products (WHITE WILLOW BARK PO) Take by mouth daily. " Pt states that white willow bark is natural aspirin "    [provider]  NON FORMULARY Take 1 tablet by mouth as needed. Laxative (Cleanse-More)    [provider]  PAPAYA ENZYMES PO Take by mouth 3 (three) times daily after meals.    [provider]  vitamin C (ASCORBIC ACID) 500 MG tablet Take 500 mg by mouth daily. Take 2 po    [provider]    Allergies    Amoxicillin, Ampicillin, Aspirin, Clarithromycin, Codeine, Darvocet [propoxyphene n-acetaminophen], Demerol, Diazepam, Emetrol, Erythromycin, Indomethacin, Iron, Latex, Lisinopril, Meperidine, Oxycodone, Sulfa antibiotics, Sulfonamide derivatives, and Valium  Review of Systems   Review of Systems  Constitutional: Negative for chills and fever.  HENT: Negative  for congestion.   Eyes: Negative for visual disturbance.  Respiratory: Negative for shortness of breath.   Cardiovascular: Negative for chest pain.  Gastrointestinal: Negative for abdominal pain, diarrhea and vomiting.  Genitourinary: Negative for dysuria.  Musculoskeletal:       + Right hip pain  Skin: Negative for rash.  Neurological: Negative for headaches.    Physical Exam Updated Vital Signs BP (!) 182/85   Pulse 73   Temp 98.1 F (36.7 C) (Oral)   Resp (!) 23   SpO2 97%   Physical Exam Vitals and nursing note reviewed.   Constitutional:      Appearance: Normal appearance.  HENT:     Head: Normocephalic.     Mouth/Throat:     Mouth: Mucous membranes are moist.  Cardiovascular:     Rate and Rhythm: Normal rate.  Pulmonary:     Effort: Pulmonary effort is normal. No respiratory distress.  Abdominal:     Palpations: Abdomen is soft.     Tenderness: There is no abdominal tenderness.  Musculoskeletal:        General: No swelling, tenderness, deformity or signs of injury.     Comments: No tenderness to palpation of either hip, pelvis is stable  Skin:    General: Skin is warm.  Neurological:     Mental Status: She is alert and oriented to person, place, and time. Mental status is at baseline.  Psychiatric:        Mood and Affect: Mood normal.     ED Results / Procedures / Treatments   Labs (all labs ordered are listed, but only abnormal results are displayed) Labs Reviewed - No data to display  EKG None  Radiology No results found.  Procedures Procedures   Medications Ordered in ED Medications - No data to display  ED Course  I have reviewed the triage vital signs and the nursing notes.  Pertinent labs & imaging results that were available during my care of the patient were reviewed by me and considered in my medical decision making (see chart for details).    MDM Rules/Calculators/A&P                          86 year old female presents the emergency department with right hip pain after a very low mechanism fall.  No head injury or loss of consciousness.  No syncope.  Vitals are stable on arrival.  She initially had right hip pain but this is resolved on my initial evaluation, physical exam is unremarkable.  X-ray shows osteoarthritic changes that acute osseous finding.  Patient admits that she is baseline and has no other acute complaints.  Patient will be discharged and treated as an outpatient.  Discharge plan and strict return to ED precautions discussed, patient verbalizes  understanding and agreement.  Final Clinical Impression(s) / ED Diagnoses Final diagnoses:  None    Rx / DC Orders ED Discharge Orders    None       Rozelle Logan, DO 04/24/21 2310

## 2021-04-24 NOTE — ED Triage Notes (Signed)
Pt from [facility] sent to ED for unwitnessed fall pt believe to have slide out of wheelchair to floor no obvious signs of injury.

## 2021-04-24 NOTE — Patient Outreach (Signed)
[facility] Post- Acute Care Coordinator follow up. Ms. Greb resides in [facility]. Screened for potential [facility] Care Management needs.  Update received this week from [facility] SW indicating member will transition to long term care in the facility.   No identifiable [facility] Care Management needs at this time.    Raiford Noble, MSN, RN,BSN [facility] Post Acute Care Coordinator [phone] [facility]) [phone]  (Toll free office)

## 2021-04-25 DIAGNOSIS — R5381 Other malaise: Secondary | ICD-10-CM | POA: Diagnosis not present

## 2021-04-25 DIAGNOSIS — W19XXXA Unspecified fall, initial encounter: Secondary | ICD-10-CM | POA: Diagnosis not present

## 2021-04-25 DIAGNOSIS — I10 Essential (primary) hypertension: Secondary | ICD-10-CM | POA: Diagnosis not present

## 2021-04-25 DIAGNOSIS — Z7401 Bed confinement status: Secondary | ICD-10-CM | POA: Diagnosis not present

## 2021-04-25 NOTE — ED Notes (Signed)
Patient resting in room waiting for PTAR t

## 2021-04-28 DIAGNOSIS — M25551 Pain in right hip: Secondary | ICD-10-CM | POA: Diagnosis not present

## 2021-04-28 DIAGNOSIS — W19XXXA Unspecified fall, initial encounter: Secondary | ICD-10-CM | POA: Diagnosis not present

## 2021-04-29 DIAGNOSIS — M25552 Pain in left hip: Secondary | ICD-10-CM | POA: Diagnosis not present

## 2021-04-30 DIAGNOSIS — R296 Repeated falls: Secondary | ICD-10-CM | POA: Diagnosis not present

## 2021-04-30 DIAGNOSIS — S79819A Other specified injuries of unspecified hip, initial encounter: Secondary | ICD-10-CM | POA: Diagnosis not present

## 2021-05-01 DIAGNOSIS — R41841 Cognitive communication deficit: Secondary | ICD-10-CM | POA: Diagnosis not present

## 2021-05-01 DIAGNOSIS — R1311 Dysphagia, oral phase: Secondary | ICD-10-CM | POA: Diagnosis not present

## 2021-05-01 DIAGNOSIS — R2689 Other abnormalities of gait and mobility: Secondary | ICD-10-CM | POA: Diagnosis not present

## 2021-05-01 DIAGNOSIS — R1312 Dysphagia, oropharyngeal phase: Secondary | ICD-10-CM | POA: Diagnosis not present

## 2021-05-01 DIAGNOSIS — I739 Peripheral vascular disease, unspecified: Secondary | ICD-10-CM | POA: Diagnosis not present

## 2021-05-01 DIAGNOSIS — Z9181 History of falling: Secondary | ICD-10-CM | POA: Diagnosis not present

## 2021-05-01 DIAGNOSIS — R2681 Unsteadiness on feet: Secondary | ICD-10-CM | POA: Diagnosis not present

## 2021-05-01 DIAGNOSIS — R278 Other lack of coordination: Secondary | ICD-10-CM | POA: Diagnosis not present

## 2021-05-01 DIAGNOSIS — R293 Abnormal posture: Secondary | ICD-10-CM | POA: Diagnosis not present

## 2021-05-01 DIAGNOSIS — M6281 Muscle weakness (generalized): Secondary | ICD-10-CM | POA: Diagnosis not present

## 2021-05-01 DIAGNOSIS — R262 Difficulty in walking, not elsewhere classified: Secondary | ICD-10-CM | POA: Diagnosis not present

## 2021-05-01 DIAGNOSIS — N189 Chronic kidney disease, unspecified: Secondary | ICD-10-CM | POA: Diagnosis not present

## 2021-05-01 DIAGNOSIS — S2242XD Multiple fractures of ribs, left side, subsequent encounter for fracture with routine healing: Secondary | ICD-10-CM | POA: Diagnosis not present

## 2021-05-02 DIAGNOSIS — R1311 Dysphagia, oral phase: Secondary | ICD-10-CM | POA: Diagnosis not present

## 2021-05-02 DIAGNOSIS — M6281 Muscle weakness (generalized): Secondary | ICD-10-CM | POA: Diagnosis not present

## 2021-05-02 DIAGNOSIS — I739 Peripheral vascular disease, unspecified: Secondary | ICD-10-CM | POA: Diagnosis not present

## 2021-05-02 DIAGNOSIS — S2242XD Multiple fractures of ribs, left side, subsequent encounter for fracture with routine healing: Secondary | ICD-10-CM | POA: Diagnosis not present

## 2021-05-02 DIAGNOSIS — N189 Chronic kidney disease, unspecified: Secondary | ICD-10-CM | POA: Diagnosis not present

## 2021-05-02 DIAGNOSIS — R2689 Other abnormalities of gait and mobility: Secondary | ICD-10-CM | POA: Diagnosis not present

## 2021-05-03 ENCOUNTER — Encounter (HOSPITAL_COMMUNITY): Payer: Self-pay | Admitting: Emergency Medicine

## 2021-05-03 ENCOUNTER — Emergency Department (HOSPITAL_COMMUNITY)
Admission: EM | Admit: 2021-05-03 | Discharge: 2021-05-04 | Disposition: A | Discharge status: Skilled Nursing Facility | Payer: Medicare Other | Attending: Emergency Medicine | Admitting: Emergency Medicine

## 2021-05-03 ENCOUNTER — Emergency Department (HOSPITAL_COMMUNITY): Payer: Medicare Other

## 2021-05-03 ENCOUNTER — Other Ambulatory Visit: Payer: Self-pay

## 2021-05-03 DIAGNOSIS — S93401A Sprain of unspecified ligament of right ankle, initial encounter: Secondary | ICD-10-CM | POA: Diagnosis not present

## 2021-05-03 DIAGNOSIS — R0902 Hypoxemia: Secondary | ICD-10-CM | POA: Diagnosis not present

## 2021-05-03 DIAGNOSIS — S93409A Sprain of unspecified ligament of unspecified ankle, initial encounter: Secondary | ICD-10-CM

## 2021-05-03 DIAGNOSIS — Z91040 Latex allergy status: Secondary | ICD-10-CM | POA: Diagnosis not present

## 2021-05-03 DIAGNOSIS — I10 Essential (primary) hypertension: Secondary | ICD-10-CM | POA: Insufficient documentation

## 2021-05-03 DIAGNOSIS — W19XXXA Unspecified fall, initial encounter: Secondary | ICD-10-CM

## 2021-05-03 DIAGNOSIS — M1711 Unilateral primary osteoarthritis, right knee: Secondary | ICD-10-CM | POA: Diagnosis not present

## 2021-05-03 DIAGNOSIS — S83402A Sprain of unspecified collateral ligament of left knee, initial encounter: Secondary | ICD-10-CM | POA: Diagnosis not present

## 2021-05-03 DIAGNOSIS — R404 Transient alteration of awareness: Secondary | ICD-10-CM | POA: Diagnosis not present

## 2021-05-03 DIAGNOSIS — M1610 Unilateral primary osteoarthritis, unspecified hip: Secondary | ICD-10-CM

## 2021-05-03 DIAGNOSIS — M160 Bilateral primary osteoarthritis of hip: Secondary | ICD-10-CM | POA: Insufficient documentation

## 2021-05-03 DIAGNOSIS — W050XXA Fall from non-moving wheelchair, initial encounter: Secondary | ICD-10-CM | POA: Insufficient documentation

## 2021-05-03 DIAGNOSIS — M1712 Unilateral primary osteoarthritis, left knee: Secondary | ICD-10-CM | POA: Diagnosis not present

## 2021-05-03 DIAGNOSIS — Z79899 Other long term (current) drug therapy: Secondary | ICD-10-CM | POA: Diagnosis not present

## 2021-05-03 DIAGNOSIS — S99911A Unspecified injury of right ankle, initial encounter: Secondary | ICD-10-CM | POA: Diagnosis present

## 2021-05-03 DIAGNOSIS — S8390XA Sprain of unspecified site of unspecified knee, initial encounter: Secondary | ICD-10-CM

## 2021-05-03 DIAGNOSIS — M25571 Pain in right ankle and joints of right foot: Secondary | ICD-10-CM | POA: Diagnosis not present

## 2021-05-03 DIAGNOSIS — M79604 Pain in right leg: Secondary | ICD-10-CM | POA: Diagnosis not present

## 2021-05-03 DIAGNOSIS — S8391XA Sprain of unspecified site of right knee, initial encounter: Secondary | ICD-10-CM | POA: Diagnosis not present

## 2021-05-03 LAB — BASIC METABOLIC PANEL
Anion gap: 8 (ref 5–15)
BUN: 17 mg/dL (ref 8–23)
CO2: 25 mmol/L (ref 22–32)
Calcium: 9 mg/dL (ref 8.9–10.3)
Chloride: 109 mmol/L (ref 98–111)
Creatinine, Ser: 0.82 mg/dL (ref 0.44–1.00)
GFR, Estimated: >60 mL/min (ref >60)
Glucose, Bld: 87 mg/dL (ref 70–99)
Potassium: 4.1 mmol/L (ref 3.5–5.1)
Sodium: 142 mmol/L (ref 135–145)

## 2021-05-03 LAB — CBC
HCT: 36 % (ref 36.0–46.0)
Hemoglobin: 10.9 g/dL — ABNORMAL LOW (ref 12.0–15.0)
MCH: 28.5 pg (ref 26.0–34.0)
MCHC: 30.3 g/dL (ref 30.0–36.0)
MCV: 94.2 fL (ref 80.0–100.0)
Platelets: 157 10*3/uL (ref 150–400)
RBC: 3.82 MIL/uL — ABNORMAL LOW (ref 3.87–5.11)
RDW: 15.8 % — ABNORMAL HIGH (ref 11.5–15.5)
WBC: 4.3 10*3/uL (ref 4.0–10.5)
nRBC: 0 % (ref 0.0–0.2)

## 2021-05-03 MED ORDER — ACETAMINOPHEN 325 MG PO TABS
650.0000 mg | ORAL_TABLET | Freq: Once | ORAL | Status: AC
  Administered 2021-05-03: 650 mg via ORAL
  Filled 2021-05-03: qty 2

## 2021-05-03 NOTE — ED Provider Notes (Signed)
[facility]-EMERGENC[facility] DEPT Provider Note   CSN: [id number] Arrival date & time: 05/03/21  1339     History Chief Complaint  Patient presents with  . Hip Pain    Fall   . Fall    Brittany Weber is a 87 y.o. female.  HPI   Patient presents with complaints of pain in her right ankle and left knee.  Patient is a resident of [facility] nursing facility.  Patient was trying to get out of her wheelchair when she slid down and fell.  Patient states since then she has been having pain in her right ankle and also now in her left knee.  She denies any hip pain.  Patient denies any headache.  She denies any head injury.  No loss of consciousness.  No chest pain.  No abdominal pain.  No vomiting or diarrhea.  Past Medical History:  Diagnosis Date  . Anemia of renal disease 10/22/2011  . Anemia, iron deficiency 10/22/2011  . Cataract     Patient Active Problem List   Diagnosis Date Noted  . Pain due to onychomycosis of toenails of both feet 06/13/2019  . Coagulation disorder (HCC) 06/13/2019  . Glaucoma 12/25/2017  . Peripheral vascular disease (HCC) 06/28/2015  . Anemia of renal disease 10/22/2011  . Anemia, iron deficiency 10/22/2011  . DISORDER, THYROID NOS 07/20/2007  . Pancytopenia (HCC) 07/20/2007  . ANEMIA-NOS 07/20/2007  . HYPERTENSION 07/20/2007  . ALLERGIC RHINITIS 07/20/2007  . TMJ SYNDROME 07/20/2007  . DEGENERATIVE DISC DISEASE 07/20/2007  . OSTEOPENIA 07/20/2007    Past Surgical History:  Procedure Laterality Date  . ABDOMINAL HYSTERECTOMY    . APPENDECTOMY       OB History   No obstetric history on file.     Family History  Problem Relation Age of Onset  . Diabetes Father   . Diabetes Mother   . Kidney disease Mother     Social History   Tobacco Use  . Smoking status: Never Smoker  . Smokeless tobacco: Never Used  . Tobacco comment: never used tobacco  Vaping Use  . Vaping Use: Never used  Substance Use Topics  .  Alcohol use: No    Alcohol/week: 0.0 standard drinks  . Drug use: No    Home Medications Prior to Admission medications   Medication Sig Start Date End Date Taking? Authorizing Provider  acetaminophen (TYLENOL) 325 MG tablet Take 650 mg by mouth every 8 (eight) hours as needed for headache (pain).   Yes [provider]  amLODipine (NORVASC) 2.5 MG tablet Take 2.5 mg by mouth daily. Hold for SBP <100   Yes [provider]  Cholecalciferol (VITAMIN D-3) 25 MCG (1000 UT) CAPS Take 2,000 Units by mouth at bedtime.   Yes [provider]  diclofenac Sodium (VOLTAREN) 1 % GEL Apply 2 g topically 4 (four) times daily. Patient taking differently: Apply 2 g topically 4 (four) times daily. Apply to hips 02/15/21  Yes Cardama, Amadeo Garnet, MD  divalproex (DEPAKOTE SPRINKLE) 125 MG capsule Take 125 mg by mouth 2 (two) times daily.   Yes [provider]  folic acid (FOLVITE) 1 MG tablet TAKE 1 TABLET BY MOUTH TWICE DAILY. Patient taking differently: Take 1 mg by mouth daily. 08/19/20  Yes Ennever, Rose Phi, MD  Menthol, Topical Analgesic, (BIOFREEZE) 4 % GEL Apply 1 application topically See admin instructions. Apply topically to left chest/upper abdomen/bilateral hips and legs twice daily; apply to right shoulder once daily  Yes [provider]  Skin Protectants, Misc. (EUCERIN) cream Apply 1 application topically daily. Apply to feet   Yes [provider]  vitamin C (ASCORBIC ACID) 500 MG tablet Take 1,000 mg by mouth daily.   Yes [provider]    Allergies    Amoxicillin, Ampicillin, Aspirin, Clarithromycin, Codeine, Darvocet [propoxyphene n-acetaminophen], Demerol, Diazepam, Emetrol, Erythromycin, Indomethacin, Iron, Latex, Lisinopril, Meperidine, Oxycodone, Sulfa antibiotics, Sulfonamide derivatives, and Valium  Review of Systems   Review of Systems  All other systems reviewed and are negative.   Physical Exam Updated Vital  Signs BP (!) 161/60 (BP Location: Left Arm)   Pulse (!) 53   Temp 98.2 F (36.8 C) (Oral)   Resp 16   SpO2 100%   Physical Exam Vitals and nursing note reviewed.  Constitutional:      Appearance: She is well-developed.     Comments: Elderly, frail  HENT:     Head: Normocephalic and atraumatic.     Right Ear: External ear normal.     Left Ear: External ear normal.  Eyes:     General: No scleral icterus.       Right eye: No discharge.        Left eye: No discharge.     Conjunctiva/sclera: Conjunctivae normal.  Neck:     Trachea: No tracheal deviation.  Cardiovascular:     Rate and Rhythm: Normal rate and regular rhythm.  Pulmonary:     Effort: Pulmonary effort is normal. No respiratory distress.     Breath sounds: Normal breath sounds. No stridor. No wheezing or rales.  Abdominal:     General: Bowel sounds are normal. There is no distension.     Palpations: Abdomen is soft.     Tenderness: There is no abdominal tenderness. There is no guarding or rebound.  Musculoskeletal:        General: Tenderness present. No deformity.     Cervical back: Neck supple.     Right hip: No deformity, tenderness or bony tenderness. Normal range of motion.     Left hip: No deformity, tenderness or bony tenderness. Normal range of motion.     Left knee: No swelling or effusion. Tenderness present.     Right lower leg: Tenderness present. No deformity.     Left lower leg: Normal.  Skin:    General: Skin is warm and dry.     Findings: No rash.  Neurological:     Mental Status: She is alert.     Cranial Nerves: No cranial nerve deficit (no facial droop, extraocular movements intact, no slurred speech).     Sensory: No sensory deficit.     Motor: No abnormal muscle tone or seizure activity.     Coordination: Coordination normal.     ED Results / Procedures / Treatments   Labs (all labs ordered are listed, but only abnormal results are displayed) Labs Reviewed  CBC - Abnormal; Notable for  the following components:      Result Value   RBC 3.82 (*)    Hemoglobin 10.9 (*)    RDW 15.8 (*)    All other components within normal limits  BASIC METABOLIC PANEL    EKG None  Radiology DG Knee 2 Views Left  Result Date: 05/03/2021 CLINICAL DATA:  Fall from wheelchair with knee pain, initial encounter EXAM: LEFT KNEE - 2 VIEW COMPARISON:  None. FINDINGS: Mild degenerative changes of the knee joint are seen. No joint effusion is seen. No acute fracture  or dislocation is noted. IMPRESSION: Degenerative change without acute abnormality. Electronically Signed   By: Alcide Clever M.D.   On: 05/03/2021 15:47   DG Ankle Complete Right  Result Date: 05/03/2021 CLINICAL DATA:  Larey Seat today.  Right ankle pain. EXAM: RIGHT ANKLE - COMPLETE 3+ VIEW COMPARISON:  None. FINDINGS: No fracture. Small area of sclerosis in the lateral distal tibia at the metadiaphysis, likely a bone island, benign/nonaggressive in appearance. No other bone lesions. Ankle joint normally spaced and aligned.  No arthropathic changes. Soft tissues are unremarkable. IMPRESSION: No fracture or dislocation. Electronically Signed   By: Amie Portland M.D.   On: 05/03/2021 15:47   DG HIPS BILAT WITH PELVIS MIN 5 VIEWS  Result Date: 05/03/2021 CLINICAL DATA:  Recent fall with leg pain, initial encounter EXAM: DG HIP (WITH OR WITHOUT PELVIS) 5+V BILAT COMPARISON:  04/24/2021 FINDINGS: Pelvic ring is intact. Significant degenerative changes in the right hip joint are noted with remodeling of the right femoral head. The overall appearance is stable. No definitive fracture is noted. No joint effusion is seen. Left hip demonstrates mild degenerative change. No acute bony abnormality is seen. Sclerosis is noted in the left iliac wing/acetabular stable from the prior study. IMPRESSION: Overall stable appearance when compared with the prior exam. No definitive fracture is seen. CT may be helpful for further evaluation. Severe degenerative changes  in the right hip joint are noted. Electronically Signed   By: Alcide Clever M.D.   On: 05/03/2021 15:52    Procedures Procedures   Medications Ordered in ED Medications  acetaminophen (TYLENOL) tablet 650 mg (has no administration in time range)    ED Course  I have reviewed the triage vital signs and the nursing notes.  Pertinent labs & imaging results that were available during my care of the patient were reviewed by me and considered in my medical decision making (see chart for details).  Clinical Course as of 05/03/21 1648  Sat May 03, 2021  1622 Hemoglobin is at baseline.  Metabolic panel unremarkable [JK]  1623 No acute finding noted on knee and ankle.  Severe degeneration noted in the right hip.  Patient is not complaining of any hip pain [JK]    Clinical Course User Index [JK] Linwood Dibbles, MD   MDM Rules/Calculators/A&P                          Xrays without fx or dislocation.  Pt denies any hip pain.  Do not think additional imaging is necessary.  Pt is normally in a wheelchair.  Tylenol for pain.  Stable for discharge. Final Clinical Impression(s) / ED Diagnoses Final diagnoses:  Fall, initial encounter  Sprain of ankle, unspecified laterality, unspecified ligament, initial encounter  Sprain of knee, unspecified laterality, unspecified ligament, initial encounter  Hip arthritis    Rx / DC Orders ED Discharge Orders    None       Linwood Dibbles, MD 05/03/21 1649

## 2021-05-03 NOTE — ED Triage Notes (Signed)
BIBA Per EMS: pt coming from [facility] with a fall that occurred today. Pt was trying to get out of wheel chair and fell on her R leg.  Shortening and rotation in R leg noted  Vitals WDL  A&O x3

## 2021-05-03 NOTE — Discharge Instructions (Addendum)
Take tylenol as needed for pain.   Follow up with your doctor as needed

## 2021-05-04 DIAGNOSIS — R402411 Glasgow coma scale score 13-15, in the field [EMT or ambulance]: Secondary | ICD-10-CM | POA: Diagnosis not present

## 2021-05-04 DIAGNOSIS — I10 Essential (primary) hypertension: Secondary | ICD-10-CM | POA: Diagnosis not present

## 2021-05-06 DIAGNOSIS — D638 Anemia in other chronic diseases classified elsewhere: Secondary | ICD-10-CM | POA: Diagnosis not present

## 2021-05-06 DIAGNOSIS — M6281 Muscle weakness (generalized): Secondary | ICD-10-CM | POA: Diagnosis not present

## 2021-05-06 DIAGNOSIS — I10 Essential (primary) hypertension: Secondary | ICD-10-CM | POA: Diagnosis not present

## 2021-05-06 DIAGNOSIS — R2689 Other abnormalities of gait and mobility: Secondary | ICD-10-CM | POA: Diagnosis not present

## 2021-05-06 DIAGNOSIS — W19XXXA Unspecified fall, initial encounter: Secondary | ICD-10-CM | POA: Diagnosis not present

## 2021-05-06 DIAGNOSIS — I739 Peripheral vascular disease, unspecified: Secondary | ICD-10-CM | POA: Diagnosis not present

## 2021-05-06 DIAGNOSIS — S2242XD Multiple fractures of ribs, left side, subsequent encounter for fracture with routine healing: Secondary | ICD-10-CM | POA: Diagnosis not present

## 2021-05-06 DIAGNOSIS — R1311 Dysphagia, oral phase: Secondary | ICD-10-CM | POA: Diagnosis not present

## 2021-05-06 DIAGNOSIS — N189 Chronic kidney disease, unspecified: Secondary | ICD-10-CM | POA: Diagnosis not present

## 2021-05-07 DIAGNOSIS — I739 Peripheral vascular disease, unspecified: Secondary | ICD-10-CM | POA: Diagnosis not present

## 2021-05-07 DIAGNOSIS — R2689 Other abnormalities of gait and mobility: Secondary | ICD-10-CM | POA: Diagnosis not present

## 2021-05-07 DIAGNOSIS — M6281 Muscle weakness (generalized): Secondary | ICD-10-CM | POA: Diagnosis not present

## 2021-05-07 DIAGNOSIS — R293 Abnormal posture: Secondary | ICD-10-CM | POA: Diagnosis not present

## 2021-05-07 DIAGNOSIS — Z9181 History of falling: Secondary | ICD-10-CM | POA: Diagnosis not present

## 2021-05-07 DIAGNOSIS — R1312 Dysphagia, oropharyngeal phase: Secondary | ICD-10-CM | POA: Diagnosis not present

## 2021-05-07 DIAGNOSIS — R2681 Unsteadiness on feet: Secondary | ICD-10-CM | POA: Diagnosis not present

## 2021-05-07 DIAGNOSIS — R262 Difficulty in walking, not elsewhere classified: Secondary | ICD-10-CM | POA: Diagnosis not present

## 2021-05-07 DIAGNOSIS — R41841 Cognitive communication deficit: Secondary | ICD-10-CM | POA: Diagnosis not present

## 2021-05-07 DIAGNOSIS — R278 Other lack of coordination: Secondary | ICD-10-CM | POA: Diagnosis not present

## 2021-05-07 DIAGNOSIS — N189 Chronic kidney disease, unspecified: Secondary | ICD-10-CM | POA: Diagnosis not present

## 2021-05-07 DIAGNOSIS — S2242XD Multiple fractures of ribs, left side, subsequent encounter for fracture with routine healing: Secondary | ICD-10-CM | POA: Diagnosis not present

## 2021-05-08 DIAGNOSIS — R293 Abnormal posture: Secondary | ICD-10-CM | POA: Diagnosis not present

## 2021-05-08 DIAGNOSIS — R2681 Unsteadiness on feet: Secondary | ICD-10-CM | POA: Diagnosis not present

## 2021-05-08 DIAGNOSIS — N189 Chronic kidney disease, unspecified: Secondary | ICD-10-CM | POA: Diagnosis not present

## 2021-05-08 DIAGNOSIS — S2242XD Multiple fractures of ribs, left side, subsequent encounter for fracture with routine healing: Secondary | ICD-10-CM | POA: Diagnosis not present

## 2021-05-08 DIAGNOSIS — R41841 Cognitive communication deficit: Secondary | ICD-10-CM | POA: Diagnosis not present

## 2021-05-08 DIAGNOSIS — R1312 Dysphagia, oropharyngeal phase: Secondary | ICD-10-CM | POA: Diagnosis not present

## 2021-05-09 DIAGNOSIS — S2242XD Multiple fractures of ribs, left side, subsequent encounter for fracture with routine healing: Secondary | ICD-10-CM | POA: Diagnosis not present

## 2021-05-09 DIAGNOSIS — R2681 Unsteadiness on feet: Secondary | ICD-10-CM | POA: Diagnosis not present

## 2021-05-09 DIAGNOSIS — R293 Abnormal posture: Secondary | ICD-10-CM | POA: Diagnosis not present

## 2021-05-09 DIAGNOSIS — R41841 Cognitive communication deficit: Secondary | ICD-10-CM | POA: Diagnosis not present

## 2021-05-09 DIAGNOSIS — R1312 Dysphagia, oropharyngeal phase: Secondary | ICD-10-CM | POA: Diagnosis not present

## 2021-05-09 DIAGNOSIS — N189 Chronic kidney disease, unspecified: Secondary | ICD-10-CM | POA: Diagnosis not present

## 2021-05-12 DIAGNOSIS — R2681 Unsteadiness on feet: Secondary | ICD-10-CM | POA: Diagnosis not present

## 2021-05-12 DIAGNOSIS — N189 Chronic kidney disease, unspecified: Secondary | ICD-10-CM | POA: Diagnosis not present

## 2021-05-12 DIAGNOSIS — S2242XD Multiple fractures of ribs, left side, subsequent encounter for fracture with routine healing: Secondary | ICD-10-CM | POA: Diagnosis not present

## 2021-05-12 DIAGNOSIS — R293 Abnormal posture: Secondary | ICD-10-CM | POA: Diagnosis not present

## 2021-05-12 DIAGNOSIS — R1312 Dysphagia, oropharyngeal phase: Secondary | ICD-10-CM | POA: Diagnosis not present

## 2021-05-12 DIAGNOSIS — R41841 Cognitive communication deficit: Secondary | ICD-10-CM | POA: Diagnosis not present

## 2021-05-13 DIAGNOSIS — I10 Essential (primary) hypertension: Secondary | ICD-10-CM | POA: Diagnosis not present

## 2021-05-13 DIAGNOSIS — I739 Peripheral vascular disease, unspecified: Secondary | ICD-10-CM | POA: Diagnosis not present

## 2021-05-13 DIAGNOSIS — N189 Chronic kidney disease, unspecified: Secondary | ICD-10-CM | POA: Diagnosis not present

## 2021-05-13 DIAGNOSIS — S2242XD Multiple fractures of ribs, left side, subsequent encounter for fracture with routine healing: Secondary | ICD-10-CM | POA: Diagnosis not present

## 2021-05-13 DIAGNOSIS — R2681 Unsteadiness on feet: Secondary | ICD-10-CM | POA: Diagnosis not present

## 2021-05-13 DIAGNOSIS — R1312 Dysphagia, oropharyngeal phase: Secondary | ICD-10-CM | POA: Diagnosis not present

## 2021-05-13 DIAGNOSIS — R41841 Cognitive communication deficit: Secondary | ICD-10-CM | POA: Diagnosis not present

## 2021-05-13 DIAGNOSIS — D638 Anemia in other chronic diseases classified elsewhere: Secondary | ICD-10-CM | POA: Diagnosis not present

## 2021-05-13 DIAGNOSIS — M1612 Unilateral primary osteoarthritis, left hip: Secondary | ICD-10-CM | POA: Diagnosis not present

## 2021-05-13 DIAGNOSIS — R293 Abnormal posture: Secondary | ICD-10-CM | POA: Diagnosis not present

## 2021-05-14 DIAGNOSIS — R946 Abnormal results of thyroid function studies: Secondary | ICD-10-CM | POA: Diagnosis not present

## 2021-05-14 DIAGNOSIS — N189 Chronic kidney disease, unspecified: Secondary | ICD-10-CM | POA: Diagnosis not present

## 2021-05-14 DIAGNOSIS — R799 Abnormal finding of blood chemistry, unspecified: Secondary | ICD-10-CM | POA: Diagnosis not present

## 2021-05-14 DIAGNOSIS — R1312 Dysphagia, oropharyngeal phase: Secondary | ICD-10-CM | POA: Diagnosis not present

## 2021-05-14 DIAGNOSIS — R293 Abnormal posture: Secondary | ICD-10-CM | POA: Diagnosis not present

## 2021-05-14 DIAGNOSIS — R2681 Unsteadiness on feet: Secondary | ICD-10-CM | POA: Diagnosis not present

## 2021-05-14 DIAGNOSIS — S2242XD Multiple fractures of ribs, left side, subsequent encounter for fracture with routine healing: Secondary | ICD-10-CM | POA: Diagnosis not present

## 2021-05-14 DIAGNOSIS — R41841 Cognitive communication deficit: Secondary | ICD-10-CM | POA: Diagnosis not present

## 2021-05-14 DIAGNOSIS — E559 Vitamin D deficiency, unspecified: Secondary | ICD-10-CM | POA: Diagnosis not present

## 2021-05-15 DIAGNOSIS — S2242XD Multiple fractures of ribs, left side, subsequent encounter for fracture with routine healing: Secondary | ICD-10-CM | POA: Diagnosis not present

## 2021-05-15 DIAGNOSIS — I10 Essential (primary) hypertension: Secondary | ICD-10-CM | POA: Diagnosis not present

## 2021-05-15 DIAGNOSIS — R293 Abnormal posture: Secondary | ICD-10-CM | POA: Diagnosis not present

## 2021-05-15 DIAGNOSIS — N189 Chronic kidney disease, unspecified: Secondary | ICD-10-CM | POA: Diagnosis not present

## 2021-05-15 DIAGNOSIS — R2681 Unsteadiness on feet: Secondary | ICD-10-CM | POA: Diagnosis not present

## 2021-05-15 DIAGNOSIS — R1312 Dysphagia, oropharyngeal phase: Secondary | ICD-10-CM | POA: Diagnosis not present

## 2021-05-15 DIAGNOSIS — R41841 Cognitive communication deficit: Secondary | ICD-10-CM | POA: Diagnosis not present

## 2021-05-16 DIAGNOSIS — N189 Chronic kidney disease, unspecified: Secondary | ICD-10-CM | POA: Diagnosis not present

## 2021-05-16 DIAGNOSIS — R293 Abnormal posture: Secondary | ICD-10-CM | POA: Diagnosis not present

## 2021-05-16 DIAGNOSIS — R41841 Cognitive communication deficit: Secondary | ICD-10-CM | POA: Diagnosis not present

## 2021-05-16 DIAGNOSIS — R1312 Dysphagia, oropharyngeal phase: Secondary | ICD-10-CM | POA: Diagnosis not present

## 2021-05-16 DIAGNOSIS — R2681 Unsteadiness on feet: Secondary | ICD-10-CM | POA: Diagnosis not present

## 2021-05-16 DIAGNOSIS — S2242XD Multiple fractures of ribs, left side, subsequent encounter for fracture with routine healing: Secondary | ICD-10-CM | POA: Diagnosis not present

## 2021-05-17 DIAGNOSIS — R41841 Cognitive communication deficit: Secondary | ICD-10-CM | POA: Diagnosis not present

## 2021-05-17 DIAGNOSIS — R1312 Dysphagia, oropharyngeal phase: Secondary | ICD-10-CM | POA: Diagnosis not present

## 2021-05-17 DIAGNOSIS — R293 Abnormal posture: Secondary | ICD-10-CM | POA: Diagnosis not present

## 2021-05-17 DIAGNOSIS — N189 Chronic kidney disease, unspecified: Secondary | ICD-10-CM | POA: Diagnosis not present

## 2021-05-17 DIAGNOSIS — R2681 Unsteadiness on feet: Secondary | ICD-10-CM | POA: Diagnosis not present

## 2021-05-17 DIAGNOSIS — S2242XD Multiple fractures of ribs, left side, subsequent encounter for fracture with routine healing: Secondary | ICD-10-CM | POA: Diagnosis not present

## 2021-05-18 DIAGNOSIS — R1312 Dysphagia, oropharyngeal phase: Secondary | ICD-10-CM | POA: Diagnosis not present

## 2021-05-18 DIAGNOSIS — R41841 Cognitive communication deficit: Secondary | ICD-10-CM | POA: Diagnosis not present

## 2021-05-18 DIAGNOSIS — R293 Abnormal posture: Secondary | ICD-10-CM | POA: Diagnosis not present

## 2021-05-18 DIAGNOSIS — N189 Chronic kidney disease, unspecified: Secondary | ICD-10-CM | POA: Diagnosis not present

## 2021-05-18 DIAGNOSIS — S2242XD Multiple fractures of ribs, left side, subsequent encounter for fracture with routine healing: Secondary | ICD-10-CM | POA: Diagnosis not present

## 2021-05-18 DIAGNOSIS — R2681 Unsteadiness on feet: Secondary | ICD-10-CM | POA: Diagnosis not present

## 2021-05-19 DIAGNOSIS — R2681 Unsteadiness on feet: Secondary | ICD-10-CM | POA: Diagnosis not present

## 2021-05-19 DIAGNOSIS — R1312 Dysphagia, oropharyngeal phase: Secondary | ICD-10-CM | POA: Diagnosis not present

## 2021-05-19 DIAGNOSIS — R293 Abnormal posture: Secondary | ICD-10-CM | POA: Diagnosis not present

## 2021-05-19 DIAGNOSIS — S2242XD Multiple fractures of ribs, left side, subsequent encounter for fracture with routine healing: Secondary | ICD-10-CM | POA: Diagnosis not present

## 2021-05-19 DIAGNOSIS — N189 Chronic kidney disease, unspecified: Secondary | ICD-10-CM | POA: Diagnosis not present

## 2021-05-19 DIAGNOSIS — R41841 Cognitive communication deficit: Secondary | ICD-10-CM | POA: Diagnosis not present

## 2021-05-20 DIAGNOSIS — N189 Chronic kidney disease, unspecified: Secondary | ICD-10-CM | POA: Diagnosis not present

## 2021-05-20 DIAGNOSIS — R1312 Dysphagia, oropharyngeal phase: Secondary | ICD-10-CM | POA: Diagnosis not present

## 2021-05-20 DIAGNOSIS — R2681 Unsteadiness on feet: Secondary | ICD-10-CM | POA: Diagnosis not present

## 2021-05-20 DIAGNOSIS — R41841 Cognitive communication deficit: Secondary | ICD-10-CM | POA: Diagnosis not present

## 2021-05-20 DIAGNOSIS — R293 Abnormal posture: Secondary | ICD-10-CM | POA: Diagnosis not present

## 2021-05-20 DIAGNOSIS — S2242XD Multiple fractures of ribs, left side, subsequent encounter for fracture with routine healing: Secondary | ICD-10-CM | POA: Diagnosis not present

## 2021-05-21 DIAGNOSIS — S2242XD Multiple fractures of ribs, left side, subsequent encounter for fracture with routine healing: Secondary | ICD-10-CM | POA: Diagnosis not present

## 2021-05-21 DIAGNOSIS — N189 Chronic kidney disease, unspecified: Secondary | ICD-10-CM | POA: Diagnosis not present

## 2021-05-21 DIAGNOSIS — R2681 Unsteadiness on feet: Secondary | ICD-10-CM | POA: Diagnosis not present

## 2021-05-21 DIAGNOSIS — R293 Abnormal posture: Secondary | ICD-10-CM | POA: Diagnosis not present

## 2021-05-21 DIAGNOSIS — R41841 Cognitive communication deficit: Secondary | ICD-10-CM | POA: Diagnosis not present

## 2021-05-21 DIAGNOSIS — R1312 Dysphagia, oropharyngeal phase: Secondary | ICD-10-CM | POA: Diagnosis not present

## 2021-05-22 DIAGNOSIS — R293 Abnormal posture: Secondary | ICD-10-CM | POA: Diagnosis not present

## 2021-05-22 DIAGNOSIS — S2242XD Multiple fractures of ribs, left side, subsequent encounter for fracture with routine healing: Secondary | ICD-10-CM | POA: Diagnosis not present

## 2021-05-22 DIAGNOSIS — R1312 Dysphagia, oropharyngeal phase: Secondary | ICD-10-CM | POA: Diagnosis not present

## 2021-05-22 DIAGNOSIS — R41841 Cognitive communication deficit: Secondary | ICD-10-CM | POA: Diagnosis not present

## 2021-05-22 DIAGNOSIS — R2681 Unsteadiness on feet: Secondary | ICD-10-CM | POA: Diagnosis not present

## 2021-05-22 DIAGNOSIS — N189 Chronic kidney disease, unspecified: Secondary | ICD-10-CM | POA: Diagnosis not present

## 2021-05-23 DIAGNOSIS — R293 Abnormal posture: Secondary | ICD-10-CM | POA: Diagnosis not present

## 2021-05-23 DIAGNOSIS — R1312 Dysphagia, oropharyngeal phase: Secondary | ICD-10-CM | POA: Diagnosis not present

## 2021-05-23 DIAGNOSIS — R41841 Cognitive communication deficit: Secondary | ICD-10-CM | POA: Diagnosis not present

## 2021-05-23 DIAGNOSIS — S2242XD Multiple fractures of ribs, left side, subsequent encounter for fracture with routine healing: Secondary | ICD-10-CM | POA: Diagnosis not present

## 2021-05-23 DIAGNOSIS — N189 Chronic kidney disease, unspecified: Secondary | ICD-10-CM | POA: Diagnosis not present

## 2021-05-23 DIAGNOSIS — R2681 Unsteadiness on feet: Secondary | ICD-10-CM | POA: Diagnosis not present

## 2021-05-26 DIAGNOSIS — E559 Vitamin D deficiency, unspecified: Secondary | ICD-10-CM | POA: Diagnosis not present

## 2021-05-27 DIAGNOSIS — N189 Chronic kidney disease, unspecified: Secondary | ICD-10-CM | POA: Diagnosis not present

## 2021-05-27 DIAGNOSIS — R41841 Cognitive communication deficit: Secondary | ICD-10-CM | POA: Diagnosis not present

## 2021-05-27 DIAGNOSIS — S2242XD Multiple fractures of ribs, left side, subsequent encounter for fracture with routine healing: Secondary | ICD-10-CM | POA: Diagnosis not present

## 2021-05-27 DIAGNOSIS — R293 Abnormal posture: Secondary | ICD-10-CM | POA: Diagnosis not present

## 2021-05-27 DIAGNOSIS — R2681 Unsteadiness on feet: Secondary | ICD-10-CM | POA: Diagnosis not present

## 2021-05-27 DIAGNOSIS — R1312 Dysphagia, oropharyngeal phase: Secondary | ICD-10-CM | POA: Diagnosis not present

## 2021-05-28 DIAGNOSIS — R41841 Cognitive communication deficit: Secondary | ICD-10-CM | POA: Diagnosis not present

## 2021-05-28 DIAGNOSIS — R2681 Unsteadiness on feet: Secondary | ICD-10-CM | POA: Diagnosis not present

## 2021-05-28 DIAGNOSIS — R293 Abnormal posture: Secondary | ICD-10-CM | POA: Diagnosis not present

## 2021-05-28 DIAGNOSIS — R1312 Dysphagia, oropharyngeal phase: Secondary | ICD-10-CM | POA: Diagnosis not present

## 2021-05-28 DIAGNOSIS — S2242XD Multiple fractures of ribs, left side, subsequent encounter for fracture with routine healing: Secondary | ICD-10-CM | POA: Diagnosis not present

## 2021-05-28 DIAGNOSIS — N189 Chronic kidney disease, unspecified: Secondary | ICD-10-CM | POA: Diagnosis not present

## 2021-06-02 DIAGNOSIS — R293 Abnormal posture: Secondary | ICD-10-CM | POA: Diagnosis not present

## 2021-06-02 DIAGNOSIS — N189 Chronic kidney disease, unspecified: Secondary | ICD-10-CM | POA: Diagnosis not present

## 2021-06-02 DIAGNOSIS — R1312 Dysphagia, oropharyngeal phase: Secondary | ICD-10-CM | POA: Diagnosis not present

## 2021-06-02 DIAGNOSIS — S2242XD Multiple fractures of ribs, left side, subsequent encounter for fracture with routine healing: Secondary | ICD-10-CM | POA: Diagnosis not present

## 2021-06-02 DIAGNOSIS — R41841 Cognitive communication deficit: Secondary | ICD-10-CM | POA: Diagnosis not present

## 2021-06-02 DIAGNOSIS — R2681 Unsteadiness on feet: Secondary | ICD-10-CM | POA: Diagnosis not present

## 2021-06-03 DIAGNOSIS — N189 Chronic kidney disease, unspecified: Secondary | ICD-10-CM | POA: Diagnosis not present

## 2021-06-03 DIAGNOSIS — R2681 Unsteadiness on feet: Secondary | ICD-10-CM | POA: Diagnosis not present

## 2021-06-03 DIAGNOSIS — R293 Abnormal posture: Secondary | ICD-10-CM | POA: Diagnosis not present

## 2021-06-03 DIAGNOSIS — R1312 Dysphagia, oropharyngeal phase: Secondary | ICD-10-CM | POA: Diagnosis not present

## 2021-06-03 DIAGNOSIS — R41841 Cognitive communication deficit: Secondary | ICD-10-CM | POA: Diagnosis not present

## 2021-06-03 DIAGNOSIS — S2242XD Multiple fractures of ribs, left side, subsequent encounter for fracture with routine healing: Secondary | ICD-10-CM | POA: Diagnosis not present

## 2021-06-04 DIAGNOSIS — R2681 Unsteadiness on feet: Secondary | ICD-10-CM | POA: Diagnosis not present

## 2021-06-04 DIAGNOSIS — R293 Abnormal posture: Secondary | ICD-10-CM | POA: Diagnosis not present

## 2021-06-04 DIAGNOSIS — R1312 Dysphagia, oropharyngeal phase: Secondary | ICD-10-CM | POA: Diagnosis not present

## 2021-06-04 DIAGNOSIS — N189 Chronic kidney disease, unspecified: Secondary | ICD-10-CM | POA: Diagnosis not present

## 2021-06-04 DIAGNOSIS — S2242XD Multiple fractures of ribs, left side, subsequent encounter for fracture with routine healing: Secondary | ICD-10-CM | POA: Diagnosis not present

## 2021-06-04 DIAGNOSIS — R41841 Cognitive communication deficit: Secondary | ICD-10-CM | POA: Diagnosis not present

## 2021-06-05 DIAGNOSIS — R293 Abnormal posture: Secondary | ICD-10-CM | POA: Diagnosis not present

## 2021-06-05 DIAGNOSIS — N189 Chronic kidney disease, unspecified: Secondary | ICD-10-CM | POA: Diagnosis not present

## 2021-06-05 DIAGNOSIS — R41841 Cognitive communication deficit: Secondary | ICD-10-CM | POA: Diagnosis not present

## 2021-06-05 DIAGNOSIS — R1312 Dysphagia, oropharyngeal phase: Secondary | ICD-10-CM | POA: Diagnosis not present

## 2021-06-05 DIAGNOSIS — S2242XD Multiple fractures of ribs, left side, subsequent encounter for fracture with routine healing: Secondary | ICD-10-CM | POA: Diagnosis not present

## 2021-06-05 DIAGNOSIS — R2681 Unsteadiness on feet: Secondary | ICD-10-CM | POA: Diagnosis not present

## 2021-06-09 DIAGNOSIS — R2681 Unsteadiness on feet: Secondary | ICD-10-CM | POA: Diagnosis not present

## 2021-06-09 DIAGNOSIS — R262 Difficulty in walking, not elsewhere classified: Secondary | ICD-10-CM | POA: Diagnosis not present

## 2021-06-09 DIAGNOSIS — R278 Other lack of coordination: Secondary | ICD-10-CM | POA: Diagnosis not present

## 2021-06-09 DIAGNOSIS — Z9181 History of falling: Secondary | ICD-10-CM | POA: Diagnosis not present

## 2021-06-09 DIAGNOSIS — S2242XD Multiple fractures of ribs, left side, subsequent encounter for fracture with routine healing: Secondary | ICD-10-CM | POA: Diagnosis not present

## 2021-06-09 DIAGNOSIS — I739 Peripheral vascular disease, unspecified: Secondary | ICD-10-CM | POA: Diagnosis not present

## 2021-06-09 DIAGNOSIS — N189 Chronic kidney disease, unspecified: Secondary | ICD-10-CM | POA: Diagnosis not present

## 2021-06-09 DIAGNOSIS — R293 Abnormal posture: Secondary | ICD-10-CM | POA: Diagnosis not present

## 2021-06-09 DIAGNOSIS — M6281 Muscle weakness (generalized): Secondary | ICD-10-CM | POA: Diagnosis not present

## 2021-06-09 DIAGNOSIS — R1312 Dysphagia, oropharyngeal phase: Secondary | ICD-10-CM | POA: Diagnosis not present

## 2021-06-09 DIAGNOSIS — R41841 Cognitive communication deficit: Secondary | ICD-10-CM | POA: Diagnosis not present

## 2021-06-09 DIAGNOSIS — R2689 Other abnormalities of gait and mobility: Secondary | ICD-10-CM | POA: Diagnosis not present

## 2021-06-11 DIAGNOSIS — S2242XD Multiple fractures of ribs, left side, subsequent encounter for fracture with routine healing: Secondary | ICD-10-CM | POA: Diagnosis not present

## 2021-06-11 DIAGNOSIS — R2681 Unsteadiness on feet: Secondary | ICD-10-CM | POA: Diagnosis not present

## 2021-06-11 DIAGNOSIS — R293 Abnormal posture: Secondary | ICD-10-CM | POA: Diagnosis not present

## 2021-06-11 DIAGNOSIS — R41841 Cognitive communication deficit: Secondary | ICD-10-CM | POA: Diagnosis not present

## 2021-06-11 DIAGNOSIS — R1312 Dysphagia, oropharyngeal phase: Secondary | ICD-10-CM | POA: Diagnosis not present

## 2021-06-11 DIAGNOSIS — N189 Chronic kidney disease, unspecified: Secondary | ICD-10-CM | POA: Diagnosis not present

## 2021-06-13 DIAGNOSIS — S2242XD Multiple fractures of ribs, left side, subsequent encounter for fracture with routine healing: Secondary | ICD-10-CM | POA: Diagnosis not present

## 2021-06-13 DIAGNOSIS — N189 Chronic kidney disease, unspecified: Secondary | ICD-10-CM | POA: Diagnosis not present

## 2021-06-13 DIAGNOSIS — R1312 Dysphagia, oropharyngeal phase: Secondary | ICD-10-CM | POA: Diagnosis not present

## 2021-06-13 DIAGNOSIS — R293 Abnormal posture: Secondary | ICD-10-CM | POA: Diagnosis not present

## 2021-06-13 DIAGNOSIS — R41841 Cognitive communication deficit: Secondary | ICD-10-CM | POA: Diagnosis not present

## 2021-06-13 DIAGNOSIS — R2681 Unsteadiness on feet: Secondary | ICD-10-CM | POA: Diagnosis not present

## 2021-06-16 DIAGNOSIS — S2242XD Multiple fractures of ribs, left side, subsequent encounter for fracture with routine healing: Secondary | ICD-10-CM | POA: Diagnosis not present

## 2021-06-16 DIAGNOSIS — R1312 Dysphagia, oropharyngeal phase: Secondary | ICD-10-CM | POA: Diagnosis not present

## 2021-06-16 DIAGNOSIS — R293 Abnormal posture: Secondary | ICD-10-CM | POA: Diagnosis not present

## 2021-06-16 DIAGNOSIS — R2681 Unsteadiness on feet: Secondary | ICD-10-CM | POA: Diagnosis not present

## 2021-06-16 DIAGNOSIS — R41841 Cognitive communication deficit: Secondary | ICD-10-CM | POA: Diagnosis not present

## 2021-06-16 DIAGNOSIS — N189 Chronic kidney disease, unspecified: Secondary | ICD-10-CM | POA: Diagnosis not present

## 2021-06-18 DIAGNOSIS — R41841 Cognitive communication deficit: Secondary | ICD-10-CM | POA: Diagnosis not present

## 2021-06-18 DIAGNOSIS — N189 Chronic kidney disease, unspecified: Secondary | ICD-10-CM | POA: Diagnosis not present

## 2021-06-18 DIAGNOSIS — S2242XD Multiple fractures of ribs, left side, subsequent encounter for fracture with routine healing: Secondary | ICD-10-CM | POA: Diagnosis not present

## 2021-06-18 DIAGNOSIS — R1312 Dysphagia, oropharyngeal phase: Secondary | ICD-10-CM | POA: Diagnosis not present

## 2021-06-18 DIAGNOSIS — R293 Abnormal posture: Secondary | ICD-10-CM | POA: Diagnosis not present

## 2021-06-18 DIAGNOSIS — R2681 Unsteadiness on feet: Secondary | ICD-10-CM | POA: Diagnosis not present

## 2021-06-20 DIAGNOSIS — Z029 Encounter for administrative examinations, unspecified: Secondary | ICD-10-CM | POA: Diagnosis not present

## 2021-06-20 DIAGNOSIS — W1789XA Other fall from one level to another, initial encounter: Secondary | ICD-10-CM | POA: Diagnosis not present

## 2021-06-20 DIAGNOSIS — R262 Difficulty in walking, not elsewhere classified: Secondary | ICD-10-CM | POA: Diagnosis not present

## 2021-06-20 DIAGNOSIS — M1612 Unilateral primary osteoarthritis, left hip: Secondary | ICD-10-CM | POA: Diagnosis not present

## 2021-06-21 DIAGNOSIS — R293 Abnormal posture: Secondary | ICD-10-CM | POA: Diagnosis not present

## 2021-06-21 DIAGNOSIS — N189 Chronic kidney disease, unspecified: Secondary | ICD-10-CM | POA: Diagnosis not present

## 2021-06-21 DIAGNOSIS — R2681 Unsteadiness on feet: Secondary | ICD-10-CM | POA: Diagnosis not present

## 2021-06-21 DIAGNOSIS — S2242XD Multiple fractures of ribs, left side, subsequent encounter for fracture with routine healing: Secondary | ICD-10-CM | POA: Diagnosis not present

## 2021-06-21 DIAGNOSIS — R1312 Dysphagia, oropharyngeal phase: Secondary | ICD-10-CM | POA: Diagnosis not present

## 2021-06-21 DIAGNOSIS — R41841 Cognitive communication deficit: Secondary | ICD-10-CM | POA: Diagnosis not present

## 2021-06-22 DIAGNOSIS — M545 Low back pain, unspecified: Secondary | ICD-10-CM | POA: Diagnosis not present

## 2021-06-22 DIAGNOSIS — M25551 Pain in right hip: Secondary | ICD-10-CM | POA: Diagnosis not present

## 2021-06-22 DIAGNOSIS — M533 Sacrococcygeal disorders, not elsewhere classified: Secondary | ICD-10-CM | POA: Diagnosis not present

## 2021-06-22 DIAGNOSIS — M25552 Pain in left hip: Secondary | ICD-10-CM | POA: Diagnosis not present

## 2021-06-24 DIAGNOSIS — R2681 Unsteadiness on feet: Secondary | ICD-10-CM | POA: Diagnosis not present

## 2021-06-24 DIAGNOSIS — R293 Abnormal posture: Secondary | ICD-10-CM | POA: Diagnosis not present

## 2021-06-24 DIAGNOSIS — S2242XD Multiple fractures of ribs, left side, subsequent encounter for fracture with routine healing: Secondary | ICD-10-CM | POA: Diagnosis not present

## 2021-06-24 DIAGNOSIS — N189 Chronic kidney disease, unspecified: Secondary | ICD-10-CM | POA: Diagnosis not present

## 2021-06-24 DIAGNOSIS — R41841 Cognitive communication deficit: Secondary | ICD-10-CM | POA: Diagnosis not present

## 2021-06-24 DIAGNOSIS — R1312 Dysphagia, oropharyngeal phase: Secondary | ICD-10-CM | POA: Diagnosis not present

## 2021-06-25 DIAGNOSIS — R1312 Dysphagia, oropharyngeal phase: Secondary | ICD-10-CM | POA: Diagnosis not present

## 2021-06-25 DIAGNOSIS — S2242XD Multiple fractures of ribs, left side, subsequent encounter for fracture with routine healing: Secondary | ICD-10-CM | POA: Diagnosis not present

## 2021-06-25 DIAGNOSIS — R41841 Cognitive communication deficit: Secondary | ICD-10-CM | POA: Diagnosis not present

## 2021-06-25 DIAGNOSIS — R293 Abnormal posture: Secondary | ICD-10-CM | POA: Diagnosis not present

## 2021-06-25 DIAGNOSIS — N189 Chronic kidney disease, unspecified: Secondary | ICD-10-CM | POA: Diagnosis not present

## 2021-06-25 DIAGNOSIS — R2681 Unsteadiness on feet: Secondary | ICD-10-CM | POA: Diagnosis not present

## 2021-06-27 DIAGNOSIS — S2242XD Multiple fractures of ribs, left side, subsequent encounter for fracture with routine healing: Secondary | ICD-10-CM | POA: Diagnosis not present

## 2021-06-27 DIAGNOSIS — D519 Vitamin B12 deficiency anemia, unspecified: Secondary | ICD-10-CM | POA: Diagnosis not present

## 2021-06-27 DIAGNOSIS — R2681 Unsteadiness on feet: Secondary | ICD-10-CM | POA: Diagnosis not present

## 2021-06-27 DIAGNOSIS — R293 Abnormal posture: Secondary | ICD-10-CM | POA: Diagnosis not present

## 2021-06-27 DIAGNOSIS — R41841 Cognitive communication deficit: Secondary | ICD-10-CM | POA: Diagnosis not present

## 2021-06-27 DIAGNOSIS — R1312 Dysphagia, oropharyngeal phase: Secondary | ICD-10-CM | POA: Diagnosis not present

## 2021-06-27 DIAGNOSIS — N189 Chronic kidney disease, unspecified: Secondary | ICD-10-CM | POA: Diagnosis not present

## 2021-06-30 DIAGNOSIS — R2681 Unsteadiness on feet: Secondary | ICD-10-CM | POA: Diagnosis not present

## 2021-06-30 DIAGNOSIS — N189 Chronic kidney disease, unspecified: Secondary | ICD-10-CM | POA: Diagnosis not present

## 2021-06-30 DIAGNOSIS — R41841 Cognitive communication deficit: Secondary | ICD-10-CM | POA: Diagnosis not present

## 2021-06-30 DIAGNOSIS — R293 Abnormal posture: Secondary | ICD-10-CM | POA: Diagnosis not present

## 2021-06-30 DIAGNOSIS — R1312 Dysphagia, oropharyngeal phase: Secondary | ICD-10-CM | POA: Diagnosis not present

## 2021-06-30 DIAGNOSIS — S2242XD Multiple fractures of ribs, left side, subsequent encounter for fracture with routine healing: Secondary | ICD-10-CM | POA: Diagnosis not present

## 2021-07-01 DIAGNOSIS — I10 Essential (primary) hypertension: Secondary | ICD-10-CM | POA: Diagnosis not present

## 2021-07-01 DIAGNOSIS — E538 Deficiency of other specified B group vitamins: Secondary | ICD-10-CM | POA: Diagnosis not present

## 2021-07-02 DIAGNOSIS — R2681 Unsteadiness on feet: Secondary | ICD-10-CM | POA: Diagnosis not present

## 2021-07-02 DIAGNOSIS — R1312 Dysphagia, oropharyngeal phase: Secondary | ICD-10-CM | POA: Diagnosis not present

## 2021-07-02 DIAGNOSIS — N189 Chronic kidney disease, unspecified: Secondary | ICD-10-CM | POA: Diagnosis not present

## 2021-07-02 DIAGNOSIS — R293 Abnormal posture: Secondary | ICD-10-CM | POA: Diagnosis not present

## 2021-07-02 DIAGNOSIS — R41841 Cognitive communication deficit: Secondary | ICD-10-CM | POA: Diagnosis not present

## 2021-07-02 DIAGNOSIS — S2242XD Multiple fractures of ribs, left side, subsequent encounter for fracture with routine healing: Secondary | ICD-10-CM | POA: Diagnosis not present

## 2021-07-07 DIAGNOSIS — M6281 Muscle weakness (generalized): Secondary | ICD-10-CM | POA: Diagnosis not present

## 2021-07-07 DIAGNOSIS — R262 Difficulty in walking, not elsewhere classified: Secondary | ICD-10-CM | POA: Diagnosis not present

## 2021-07-07 DIAGNOSIS — R2681 Unsteadiness on feet: Secondary | ICD-10-CM | POA: Diagnosis not present

## 2021-07-07 DIAGNOSIS — R293 Abnormal posture: Secondary | ICD-10-CM | POA: Diagnosis not present

## 2021-07-07 DIAGNOSIS — N189 Chronic kidney disease, unspecified: Secondary | ICD-10-CM | POA: Diagnosis not present

## 2021-07-07 DIAGNOSIS — I739 Peripheral vascular disease, unspecified: Secondary | ICD-10-CM | POA: Diagnosis not present

## 2021-07-07 DIAGNOSIS — R2689 Other abnormalities of gait and mobility: Secondary | ICD-10-CM | POA: Diagnosis not present

## 2021-07-07 DIAGNOSIS — R41841 Cognitive communication deficit: Secondary | ICD-10-CM | POA: Diagnosis not present

## 2021-07-07 DIAGNOSIS — Z9181 History of falling: Secondary | ICD-10-CM | POA: Diagnosis not present

## 2021-07-07 DIAGNOSIS — R1312 Dysphagia, oropharyngeal phase: Secondary | ICD-10-CM | POA: Diagnosis not present

## 2021-07-07 DIAGNOSIS — R278 Other lack of coordination: Secondary | ICD-10-CM | POA: Diagnosis not present

## 2021-07-07 DIAGNOSIS — S2242XD Multiple fractures of ribs, left side, subsequent encounter for fracture with routine healing: Secondary | ICD-10-CM | POA: Diagnosis not present

## 2021-07-08 DIAGNOSIS — S2242XD Multiple fractures of ribs, left side, subsequent encounter for fracture with routine healing: Secondary | ICD-10-CM | POA: Diagnosis not present

## 2021-07-08 DIAGNOSIS — N189 Chronic kidney disease, unspecified: Secondary | ICD-10-CM | POA: Diagnosis not present

## 2021-07-08 DIAGNOSIS — R293 Abnormal posture: Secondary | ICD-10-CM | POA: Diagnosis not present

## 2021-07-08 DIAGNOSIS — R1312 Dysphagia, oropharyngeal phase: Secondary | ICD-10-CM | POA: Diagnosis not present

## 2021-07-08 DIAGNOSIS — R41841 Cognitive communication deficit: Secondary | ICD-10-CM | POA: Diagnosis not present

## 2021-07-08 DIAGNOSIS — R2681 Unsteadiness on feet: Secondary | ICD-10-CM | POA: Diagnosis not present

## 2021-07-09 DIAGNOSIS — R2681 Unsteadiness on feet: Secondary | ICD-10-CM | POA: Diagnosis not present

## 2021-07-09 DIAGNOSIS — R41841 Cognitive communication deficit: Secondary | ICD-10-CM | POA: Diagnosis not present

## 2021-07-09 DIAGNOSIS — R1312 Dysphagia, oropharyngeal phase: Secondary | ICD-10-CM | POA: Diagnosis not present

## 2021-07-09 DIAGNOSIS — N189 Chronic kidney disease, unspecified: Secondary | ICD-10-CM | POA: Diagnosis not present

## 2021-07-09 DIAGNOSIS — R293 Abnormal posture: Secondary | ICD-10-CM | POA: Diagnosis not present

## 2021-07-09 DIAGNOSIS — S2242XD Multiple fractures of ribs, left side, subsequent encounter for fracture with routine healing: Secondary | ICD-10-CM | POA: Diagnosis not present

## 2021-07-10 DIAGNOSIS — R41841 Cognitive communication deficit: Secondary | ICD-10-CM | POA: Diagnosis not present

## 2021-07-10 DIAGNOSIS — R293 Abnormal posture: Secondary | ICD-10-CM | POA: Diagnosis not present

## 2021-07-10 DIAGNOSIS — R1312 Dysphagia, oropharyngeal phase: Secondary | ICD-10-CM | POA: Diagnosis not present

## 2021-07-10 DIAGNOSIS — R2681 Unsteadiness on feet: Secondary | ICD-10-CM | POA: Diagnosis not present

## 2021-07-10 DIAGNOSIS — S2242XD Multiple fractures of ribs, left side, subsequent encounter for fracture with routine healing: Secondary | ICD-10-CM | POA: Diagnosis not present

## 2021-07-10 DIAGNOSIS — N189 Chronic kidney disease, unspecified: Secondary | ICD-10-CM | POA: Diagnosis not present

## 2021-07-11 DIAGNOSIS — R41841 Cognitive communication deficit: Secondary | ICD-10-CM | POA: Diagnosis not present

## 2021-07-11 DIAGNOSIS — S2242XD Multiple fractures of ribs, left side, subsequent encounter for fracture with routine healing: Secondary | ICD-10-CM | POA: Diagnosis not present

## 2021-07-11 DIAGNOSIS — R293 Abnormal posture: Secondary | ICD-10-CM | POA: Diagnosis not present

## 2021-07-11 DIAGNOSIS — N189 Chronic kidney disease, unspecified: Secondary | ICD-10-CM | POA: Diagnosis not present

## 2021-07-11 DIAGNOSIS — R2681 Unsteadiness on feet: Secondary | ICD-10-CM | POA: Diagnosis not present

## 2021-07-11 DIAGNOSIS — R1312 Dysphagia, oropharyngeal phase: Secondary | ICD-10-CM | POA: Diagnosis not present

## 2021-07-12 DIAGNOSIS — R293 Abnormal posture: Secondary | ICD-10-CM | POA: Diagnosis not present

## 2021-07-12 DIAGNOSIS — N189 Chronic kidney disease, unspecified: Secondary | ICD-10-CM | POA: Diagnosis not present

## 2021-07-12 DIAGNOSIS — R2681 Unsteadiness on feet: Secondary | ICD-10-CM | POA: Diagnosis not present

## 2021-07-12 DIAGNOSIS — S2242XD Multiple fractures of ribs, left side, subsequent encounter for fracture with routine healing: Secondary | ICD-10-CM | POA: Diagnosis not present

## 2021-07-12 DIAGNOSIS — R41841 Cognitive communication deficit: Secondary | ICD-10-CM | POA: Diagnosis not present

## 2021-07-12 DIAGNOSIS — R1312 Dysphagia, oropharyngeal phase: Secondary | ICD-10-CM | POA: Diagnosis not present

## 2021-07-15 DIAGNOSIS — R262 Difficulty in walking, not elsewhere classified: Secondary | ICD-10-CM | POA: Diagnosis not present

## 2021-07-15 DIAGNOSIS — N1831 Chronic kidney disease, stage 3a: Secondary | ICD-10-CM | POA: Diagnosis not present

## 2021-07-15 DIAGNOSIS — I10 Essential (primary) hypertension: Secondary | ICD-10-CM | POA: Diagnosis not present

## 2021-07-15 DIAGNOSIS — D638 Anemia in other chronic diseases classified elsewhere: Secondary | ICD-10-CM | POA: Diagnosis not present

## 2021-07-17 DIAGNOSIS — R2681 Unsteadiness on feet: Secondary | ICD-10-CM | POA: Diagnosis not present

## 2021-07-17 DIAGNOSIS — R293 Abnormal posture: Secondary | ICD-10-CM | POA: Diagnosis not present

## 2021-07-17 DIAGNOSIS — S2242XD Multiple fractures of ribs, left side, subsequent encounter for fracture with routine healing: Secondary | ICD-10-CM | POA: Diagnosis not present

## 2021-07-17 DIAGNOSIS — D510 Vitamin B12 deficiency anemia due to intrinsic factor deficiency: Secondary | ICD-10-CM | POA: Diagnosis not present

## 2021-07-17 DIAGNOSIS — E119 Type 2 diabetes mellitus without complications: Secondary | ICD-10-CM | POA: Diagnosis not present

## 2021-07-17 DIAGNOSIS — R1312 Dysphagia, oropharyngeal phase: Secondary | ICD-10-CM | POA: Diagnosis not present

## 2021-07-17 DIAGNOSIS — N189 Chronic kidney disease, unspecified: Secondary | ICD-10-CM | POA: Diagnosis not present

## 2021-07-17 DIAGNOSIS — R41841 Cognitive communication deficit: Secondary | ICD-10-CM | POA: Diagnosis not present

## 2021-07-18 DIAGNOSIS — R2681 Unsteadiness on feet: Secondary | ICD-10-CM | POA: Diagnosis not present

## 2021-07-18 DIAGNOSIS — N183 Chronic kidney disease, stage 3 unspecified: Secondary | ICD-10-CM | POA: Diagnosis not present

## 2021-07-18 DIAGNOSIS — R4182 Altered mental status, unspecified: Secondary | ICD-10-CM | POA: Diagnosis not present

## 2021-07-18 DIAGNOSIS — N189 Chronic kidney disease, unspecified: Secondary | ICD-10-CM | POA: Diagnosis not present

## 2021-07-18 DIAGNOSIS — R1312 Dysphagia, oropharyngeal phase: Secondary | ICD-10-CM | POA: Diagnosis not present

## 2021-07-18 DIAGNOSIS — R41841 Cognitive communication deficit: Secondary | ICD-10-CM | POA: Diagnosis not present

## 2021-07-18 DIAGNOSIS — S2242XD Multiple fractures of ribs, left side, subsequent encounter for fracture with routine healing: Secondary | ICD-10-CM | POA: Diagnosis not present

## 2021-07-18 DIAGNOSIS — K5901 Slow transit constipation: Secondary | ICD-10-CM | POA: Diagnosis not present

## 2021-07-18 DIAGNOSIS — R293 Abnormal posture: Secondary | ICD-10-CM | POA: Diagnosis not present

## 2021-07-18 DIAGNOSIS — D638 Anemia in other chronic diseases classified elsewhere: Secondary | ICD-10-CM | POA: Diagnosis not present

## 2021-07-21 DIAGNOSIS — S2242XD Multiple fractures of ribs, left side, subsequent encounter for fracture with routine healing: Secondary | ICD-10-CM | POA: Diagnosis not present

## 2021-07-21 DIAGNOSIS — R293 Abnormal posture: Secondary | ICD-10-CM | POA: Diagnosis not present

## 2021-07-21 DIAGNOSIS — R2681 Unsteadiness on feet: Secondary | ICD-10-CM | POA: Diagnosis not present

## 2021-07-21 DIAGNOSIS — F0391 Unspecified dementia with behavioral disturbance: Secondary | ICD-10-CM | POA: Diagnosis not present

## 2021-07-21 DIAGNOSIS — R41841 Cognitive communication deficit: Secondary | ICD-10-CM | POA: Diagnosis not present

## 2021-07-21 DIAGNOSIS — N189 Chronic kidney disease, unspecified: Secondary | ICD-10-CM | POA: Diagnosis not present

## 2021-07-21 DIAGNOSIS — R1312 Dysphagia, oropharyngeal phase: Secondary | ICD-10-CM | POA: Diagnosis not present

## 2021-07-22 DIAGNOSIS — S2242XD Multiple fractures of ribs, left side, subsequent encounter for fracture with routine healing: Secondary | ICD-10-CM | POA: Diagnosis not present

## 2021-07-22 DIAGNOSIS — R1312 Dysphagia, oropharyngeal phase: Secondary | ICD-10-CM | POA: Diagnosis not present

## 2021-07-22 DIAGNOSIS — R41841 Cognitive communication deficit: Secondary | ICD-10-CM | POA: Diagnosis not present

## 2021-07-22 DIAGNOSIS — R2681 Unsteadiness on feet: Secondary | ICD-10-CM | POA: Diagnosis not present

## 2021-07-22 DIAGNOSIS — N189 Chronic kidney disease, unspecified: Secondary | ICD-10-CM | POA: Diagnosis not present

## 2021-07-22 DIAGNOSIS — R293 Abnormal posture: Secondary | ICD-10-CM | POA: Diagnosis not present

## 2021-07-23 DIAGNOSIS — R2681 Unsteadiness on feet: Secondary | ICD-10-CM | POA: Diagnosis not present

## 2021-07-23 DIAGNOSIS — R293 Abnormal posture: Secondary | ICD-10-CM | POA: Diagnosis not present

## 2021-07-23 DIAGNOSIS — R1312 Dysphagia, oropharyngeal phase: Secondary | ICD-10-CM | POA: Diagnosis not present

## 2021-07-23 DIAGNOSIS — R41841 Cognitive communication deficit: Secondary | ICD-10-CM | POA: Diagnosis not present

## 2021-07-23 DIAGNOSIS — N189 Chronic kidney disease, unspecified: Secondary | ICD-10-CM | POA: Diagnosis not present

## 2021-07-23 DIAGNOSIS — S2242XD Multiple fractures of ribs, left side, subsequent encounter for fracture with routine healing: Secondary | ICD-10-CM | POA: Diagnosis not present

## 2021-07-24 DIAGNOSIS — R293 Abnormal posture: Secondary | ICD-10-CM | POA: Diagnosis not present

## 2021-07-24 DIAGNOSIS — R41841 Cognitive communication deficit: Secondary | ICD-10-CM | POA: Diagnosis not present

## 2021-07-24 DIAGNOSIS — R2681 Unsteadiness on feet: Secondary | ICD-10-CM | POA: Diagnosis not present

## 2021-07-24 DIAGNOSIS — S2242XD Multiple fractures of ribs, left side, subsequent encounter for fracture with routine healing: Secondary | ICD-10-CM | POA: Diagnosis not present

## 2021-07-24 DIAGNOSIS — R1312 Dysphagia, oropharyngeal phase: Secondary | ICD-10-CM | POA: Diagnosis not present

## 2021-07-24 DIAGNOSIS — N189 Chronic kidney disease, unspecified: Secondary | ICD-10-CM | POA: Diagnosis not present

## 2021-07-25 DIAGNOSIS — R2681 Unsteadiness on feet: Secondary | ICD-10-CM | POA: Diagnosis not present

## 2021-07-25 DIAGNOSIS — R41841 Cognitive communication deficit: Secondary | ICD-10-CM | POA: Diagnosis not present

## 2021-07-25 DIAGNOSIS — S2242XD Multiple fractures of ribs, left side, subsequent encounter for fracture with routine healing: Secondary | ICD-10-CM | POA: Diagnosis not present

## 2021-07-25 DIAGNOSIS — R1312 Dysphagia, oropharyngeal phase: Secondary | ICD-10-CM | POA: Diagnosis not present

## 2021-07-25 DIAGNOSIS — R293 Abnormal posture: Secondary | ICD-10-CM | POA: Diagnosis not present

## 2021-07-25 DIAGNOSIS — N189 Chronic kidney disease, unspecified: Secondary | ICD-10-CM | POA: Diagnosis not present

## 2021-07-28 DIAGNOSIS — R41841 Cognitive communication deficit: Secondary | ICD-10-CM | POA: Diagnosis not present

## 2021-07-28 DIAGNOSIS — R293 Abnormal posture: Secondary | ICD-10-CM | POA: Diagnosis not present

## 2021-07-28 DIAGNOSIS — R1312 Dysphagia, oropharyngeal phase: Secondary | ICD-10-CM | POA: Diagnosis not present

## 2021-07-28 DIAGNOSIS — N189 Chronic kidney disease, unspecified: Secondary | ICD-10-CM | POA: Diagnosis not present

## 2021-07-28 DIAGNOSIS — S2242XD Multiple fractures of ribs, left side, subsequent encounter for fracture with routine healing: Secondary | ICD-10-CM | POA: Diagnosis not present

## 2021-07-28 DIAGNOSIS — R2681 Unsteadiness on feet: Secondary | ICD-10-CM | POA: Diagnosis not present

## 2021-07-29 DIAGNOSIS — S2242XD Multiple fractures of ribs, left side, subsequent encounter for fracture with routine healing: Secondary | ICD-10-CM | POA: Diagnosis not present

## 2021-07-29 DIAGNOSIS — R293 Abnormal posture: Secondary | ICD-10-CM | POA: Diagnosis not present

## 2021-07-29 DIAGNOSIS — N189 Chronic kidney disease, unspecified: Secondary | ICD-10-CM | POA: Diagnosis not present

## 2021-07-29 DIAGNOSIS — R41841 Cognitive communication deficit: Secondary | ICD-10-CM | POA: Diagnosis not present

## 2021-07-29 DIAGNOSIS — R2681 Unsteadiness on feet: Secondary | ICD-10-CM | POA: Diagnosis not present

## 2021-07-29 DIAGNOSIS — R1312 Dysphagia, oropharyngeal phase: Secondary | ICD-10-CM | POA: Diagnosis not present

## 2021-07-30 DIAGNOSIS — N189 Chronic kidney disease, unspecified: Secondary | ICD-10-CM | POA: Diagnosis not present

## 2021-07-30 DIAGNOSIS — R1312 Dysphagia, oropharyngeal phase: Secondary | ICD-10-CM | POA: Diagnosis not present

## 2021-07-30 DIAGNOSIS — R41841 Cognitive communication deficit: Secondary | ICD-10-CM | POA: Diagnosis not present

## 2021-07-30 DIAGNOSIS — R293 Abnormal posture: Secondary | ICD-10-CM | POA: Diagnosis not present

## 2021-07-30 DIAGNOSIS — S2242XD Multiple fractures of ribs, left side, subsequent encounter for fracture with routine healing: Secondary | ICD-10-CM | POA: Diagnosis not present

## 2021-07-30 DIAGNOSIS — R2681 Unsteadiness on feet: Secondary | ICD-10-CM | POA: Diagnosis not present

## 2021-07-31 DIAGNOSIS — R41841 Cognitive communication deficit: Secondary | ICD-10-CM | POA: Diagnosis not present

## 2021-07-31 DIAGNOSIS — N189 Chronic kidney disease, unspecified: Secondary | ICD-10-CM | POA: Diagnosis not present

## 2021-07-31 DIAGNOSIS — R2681 Unsteadiness on feet: Secondary | ICD-10-CM | POA: Diagnosis not present

## 2021-07-31 DIAGNOSIS — R1312 Dysphagia, oropharyngeal phase: Secondary | ICD-10-CM | POA: Diagnosis not present

## 2021-07-31 DIAGNOSIS — S2242XD Multiple fractures of ribs, left side, subsequent encounter for fracture with routine healing: Secondary | ICD-10-CM | POA: Diagnosis not present

## 2021-07-31 DIAGNOSIS — R293 Abnormal posture: Secondary | ICD-10-CM | POA: Diagnosis not present

## 2021-08-01 DIAGNOSIS — R2681 Unsteadiness on feet: Secondary | ICD-10-CM | POA: Diagnosis not present

## 2021-08-01 DIAGNOSIS — R293 Abnormal posture: Secondary | ICD-10-CM | POA: Diagnosis not present

## 2021-08-01 DIAGNOSIS — R41841 Cognitive communication deficit: Secondary | ICD-10-CM | POA: Diagnosis not present

## 2021-08-01 DIAGNOSIS — R1312 Dysphagia, oropharyngeal phase: Secondary | ICD-10-CM | POA: Diagnosis not present

## 2021-08-01 DIAGNOSIS — S2242XD Multiple fractures of ribs, left side, subsequent encounter for fracture with routine healing: Secondary | ICD-10-CM | POA: Diagnosis not present

## 2021-08-01 DIAGNOSIS — N189 Chronic kidney disease, unspecified: Secondary | ICD-10-CM | POA: Diagnosis not present

## 2021-08-04 DIAGNOSIS — R2681 Unsteadiness on feet: Secondary | ICD-10-CM | POA: Diagnosis not present

## 2021-08-04 DIAGNOSIS — R1312 Dysphagia, oropharyngeal phase: Secondary | ICD-10-CM | POA: Diagnosis not present

## 2021-08-04 DIAGNOSIS — R293 Abnormal posture: Secondary | ICD-10-CM | POA: Diagnosis not present

## 2021-08-04 DIAGNOSIS — S2242XD Multiple fractures of ribs, left side, subsequent encounter for fracture with routine healing: Secondary | ICD-10-CM | POA: Diagnosis not present

## 2021-08-04 DIAGNOSIS — N189 Chronic kidney disease, unspecified: Secondary | ICD-10-CM | POA: Diagnosis not present

## 2021-08-04 DIAGNOSIS — R41841 Cognitive communication deficit: Secondary | ICD-10-CM | POA: Diagnosis not present

## 2021-08-05 DIAGNOSIS — S2242XD Multiple fractures of ribs, left side, subsequent encounter for fracture with routine healing: Secondary | ICD-10-CM | POA: Diagnosis not present

## 2021-08-05 DIAGNOSIS — R2681 Unsteadiness on feet: Secondary | ICD-10-CM | POA: Diagnosis not present

## 2021-08-05 DIAGNOSIS — R41841 Cognitive communication deficit: Secondary | ICD-10-CM | POA: Diagnosis not present

## 2021-08-05 DIAGNOSIS — N189 Chronic kidney disease, unspecified: Secondary | ICD-10-CM | POA: Diagnosis not present

## 2021-08-05 DIAGNOSIS — R293 Abnormal posture: Secondary | ICD-10-CM | POA: Diagnosis not present

## 2021-08-05 DIAGNOSIS — R1312 Dysphagia, oropharyngeal phase: Secondary | ICD-10-CM | POA: Diagnosis not present

## 2021-08-06 DIAGNOSIS — R293 Abnormal posture: Secondary | ICD-10-CM | POA: Diagnosis not present

## 2021-08-06 DIAGNOSIS — S2242XD Multiple fractures of ribs, left side, subsequent encounter for fracture with routine healing: Secondary | ICD-10-CM | POA: Diagnosis not present

## 2021-08-06 DIAGNOSIS — N189 Chronic kidney disease, unspecified: Secondary | ICD-10-CM | POA: Diagnosis not present

## 2021-08-06 DIAGNOSIS — F0391 Unspecified dementia with behavioral disturbance: Secondary | ICD-10-CM | POA: Diagnosis not present

## 2021-08-06 DIAGNOSIS — R2681 Unsteadiness on feet: Secondary | ICD-10-CM | POA: Diagnosis not present

## 2021-08-06 DIAGNOSIS — R41841 Cognitive communication deficit: Secondary | ICD-10-CM | POA: Diagnosis not present

## 2021-08-06 DIAGNOSIS — R1312 Dysphagia, oropharyngeal phase: Secondary | ICD-10-CM | POA: Diagnosis not present

## 2021-08-07 DIAGNOSIS — N189 Chronic kidney disease, unspecified: Secondary | ICD-10-CM | POA: Diagnosis not present

## 2021-08-07 DIAGNOSIS — R262 Difficulty in walking, not elsewhere classified: Secondary | ICD-10-CM | POA: Diagnosis not present

## 2021-08-07 DIAGNOSIS — R1312 Dysphagia, oropharyngeal phase: Secondary | ICD-10-CM | POA: Diagnosis not present

## 2021-08-07 DIAGNOSIS — R293 Abnormal posture: Secondary | ICD-10-CM | POA: Diagnosis not present

## 2021-08-07 DIAGNOSIS — R2689 Other abnormalities of gait and mobility: Secondary | ICD-10-CM | POA: Diagnosis not present

## 2021-08-07 DIAGNOSIS — I739 Peripheral vascular disease, unspecified: Secondary | ICD-10-CM | POA: Diagnosis not present

## 2021-08-07 DIAGNOSIS — Z9181 History of falling: Secondary | ICD-10-CM | POA: Diagnosis not present

## 2021-08-07 DIAGNOSIS — R2681 Unsteadiness on feet: Secondary | ICD-10-CM | POA: Diagnosis not present

## 2021-08-07 DIAGNOSIS — M6281 Muscle weakness (generalized): Secondary | ICD-10-CM | POA: Diagnosis not present

## 2021-08-07 DIAGNOSIS — S2242XD Multiple fractures of ribs, left side, subsequent encounter for fracture with routine healing: Secondary | ICD-10-CM | POA: Diagnosis not present

## 2021-08-07 DIAGNOSIS — R41841 Cognitive communication deficit: Secondary | ICD-10-CM | POA: Diagnosis not present

## 2021-08-07 DIAGNOSIS — R278 Other lack of coordination: Secondary | ICD-10-CM | POA: Diagnosis not present

## 2021-08-08 DIAGNOSIS — R293 Abnormal posture: Secondary | ICD-10-CM | POA: Diagnosis not present

## 2021-08-08 DIAGNOSIS — R2681 Unsteadiness on feet: Secondary | ICD-10-CM | POA: Diagnosis not present

## 2021-08-08 DIAGNOSIS — N189 Chronic kidney disease, unspecified: Secondary | ICD-10-CM | POA: Diagnosis not present

## 2021-08-08 DIAGNOSIS — R41841 Cognitive communication deficit: Secondary | ICD-10-CM | POA: Diagnosis not present

## 2021-08-08 DIAGNOSIS — S2242XD Multiple fractures of ribs, left side, subsequent encounter for fracture with routine healing: Secondary | ICD-10-CM | POA: Diagnosis not present

## 2021-08-08 DIAGNOSIS — R1312 Dysphagia, oropharyngeal phase: Secondary | ICD-10-CM | POA: Diagnosis not present

## 2021-08-11 DIAGNOSIS — R2681 Unsteadiness on feet: Secondary | ICD-10-CM | POA: Diagnosis not present

## 2021-08-11 DIAGNOSIS — R41841 Cognitive communication deficit: Secondary | ICD-10-CM | POA: Diagnosis not present

## 2021-08-11 DIAGNOSIS — N189 Chronic kidney disease, unspecified: Secondary | ICD-10-CM | POA: Diagnosis not present

## 2021-08-11 DIAGNOSIS — R293 Abnormal posture: Secondary | ICD-10-CM | POA: Diagnosis not present

## 2021-08-11 DIAGNOSIS — R1312 Dysphagia, oropharyngeal phase: Secondary | ICD-10-CM | POA: Diagnosis not present

## 2021-08-11 DIAGNOSIS — S2242XD Multiple fractures of ribs, left side, subsequent encounter for fracture with routine healing: Secondary | ICD-10-CM | POA: Diagnosis not present

## 2021-08-12 DIAGNOSIS — R2681 Unsteadiness on feet: Secondary | ICD-10-CM | POA: Diagnosis not present

## 2021-08-12 DIAGNOSIS — S2242XD Multiple fractures of ribs, left side, subsequent encounter for fracture with routine healing: Secondary | ICD-10-CM | POA: Diagnosis not present

## 2021-08-12 DIAGNOSIS — R293 Abnormal posture: Secondary | ICD-10-CM | POA: Diagnosis not present

## 2021-08-12 DIAGNOSIS — R1312 Dysphagia, oropharyngeal phase: Secondary | ICD-10-CM | POA: Diagnosis not present

## 2021-08-12 DIAGNOSIS — N189 Chronic kidney disease, unspecified: Secondary | ICD-10-CM | POA: Diagnosis not present

## 2021-08-12 DIAGNOSIS — R41841 Cognitive communication deficit: Secondary | ICD-10-CM | POA: Diagnosis not present

## 2021-08-13 DIAGNOSIS — R1312 Dysphagia, oropharyngeal phase: Secondary | ICD-10-CM | POA: Diagnosis not present

## 2021-08-13 DIAGNOSIS — R2681 Unsteadiness on feet: Secondary | ICD-10-CM | POA: Diagnosis not present

## 2021-08-13 DIAGNOSIS — N189 Chronic kidney disease, unspecified: Secondary | ICD-10-CM | POA: Diagnosis not present

## 2021-08-13 DIAGNOSIS — R41841 Cognitive communication deficit: Secondary | ICD-10-CM | POA: Diagnosis not present

## 2021-08-13 DIAGNOSIS — S2242XD Multiple fractures of ribs, left side, subsequent encounter for fracture with routine healing: Secondary | ICD-10-CM | POA: Diagnosis not present

## 2021-08-13 DIAGNOSIS — R293 Abnormal posture: Secondary | ICD-10-CM | POA: Diagnosis not present

## 2021-08-14 DIAGNOSIS — R293 Abnormal posture: Secondary | ICD-10-CM | POA: Diagnosis not present

## 2021-08-14 DIAGNOSIS — N189 Chronic kidney disease, unspecified: Secondary | ICD-10-CM | POA: Diagnosis not present

## 2021-08-14 DIAGNOSIS — R2681 Unsteadiness on feet: Secondary | ICD-10-CM | POA: Diagnosis not present

## 2021-08-14 DIAGNOSIS — S2242XD Multiple fractures of ribs, left side, subsequent encounter for fracture with routine healing: Secondary | ICD-10-CM | POA: Diagnosis not present

## 2021-08-14 DIAGNOSIS — R1312 Dysphagia, oropharyngeal phase: Secondary | ICD-10-CM | POA: Diagnosis not present

## 2021-08-14 DIAGNOSIS — R41841 Cognitive communication deficit: Secondary | ICD-10-CM | POA: Diagnosis not present

## 2021-08-14 IMAGING — CR DG HIP (WITH OR WITHOUT PELVIS) 5+V BILAT
5 series · 5 of 5 positions shown · non-contrast
Comparison: 04/24/2021

CLINICAL DATA: Recent fall with leg pain, initial encounter

EXAM:
DG HIP (WITH OR WITHOUT PELVIS) 5+V BILAT

[x pelvis]
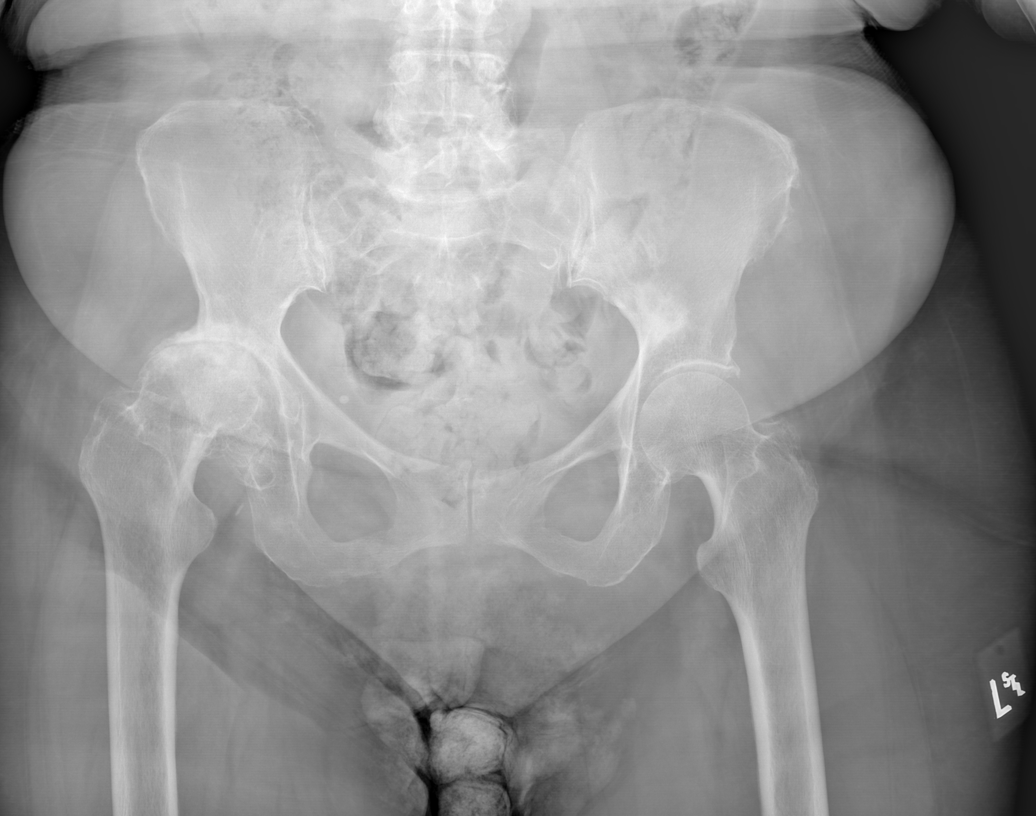

[x hip ap left]
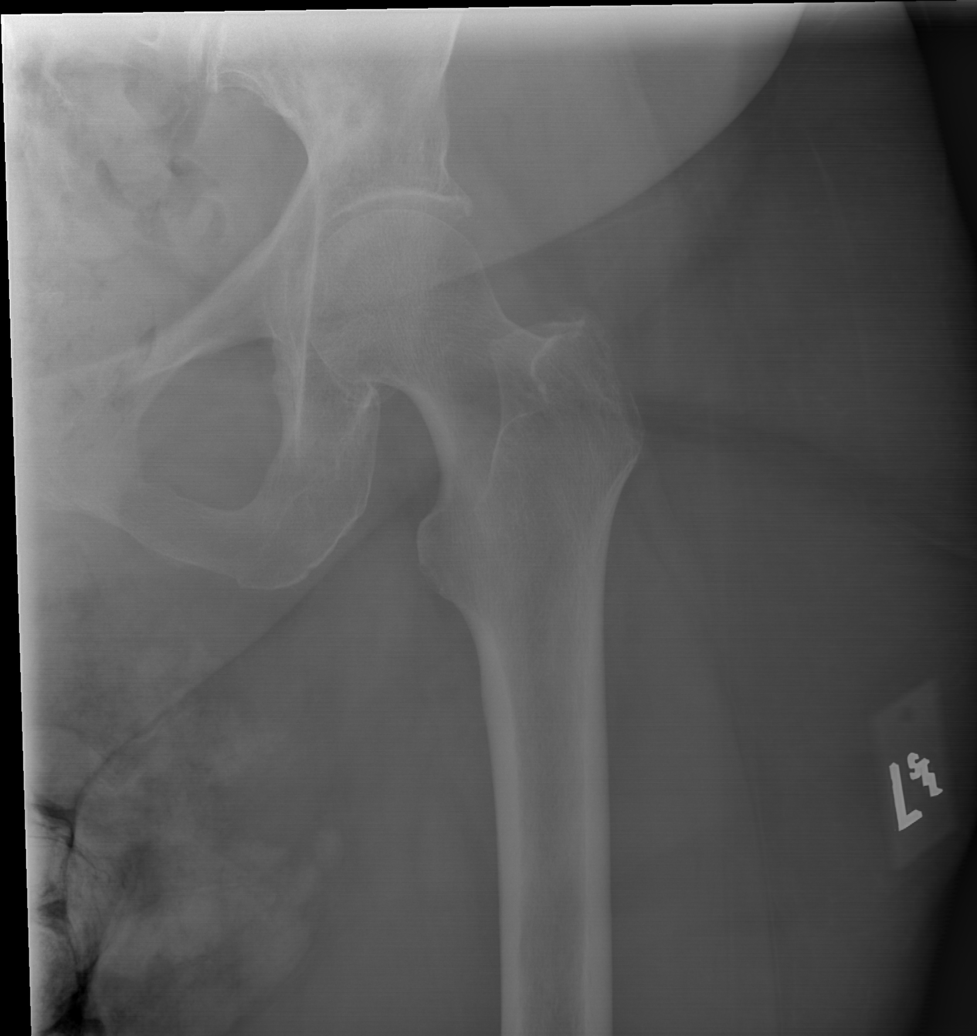

[x hip ap right]
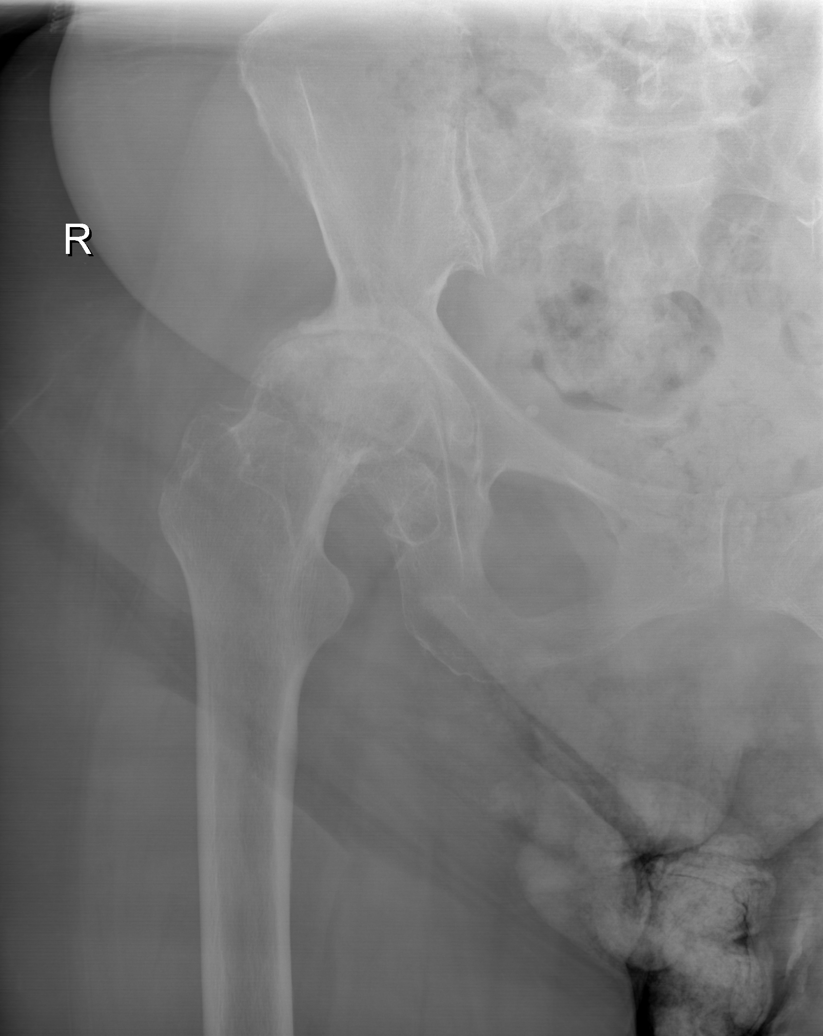

[w hip lat left]
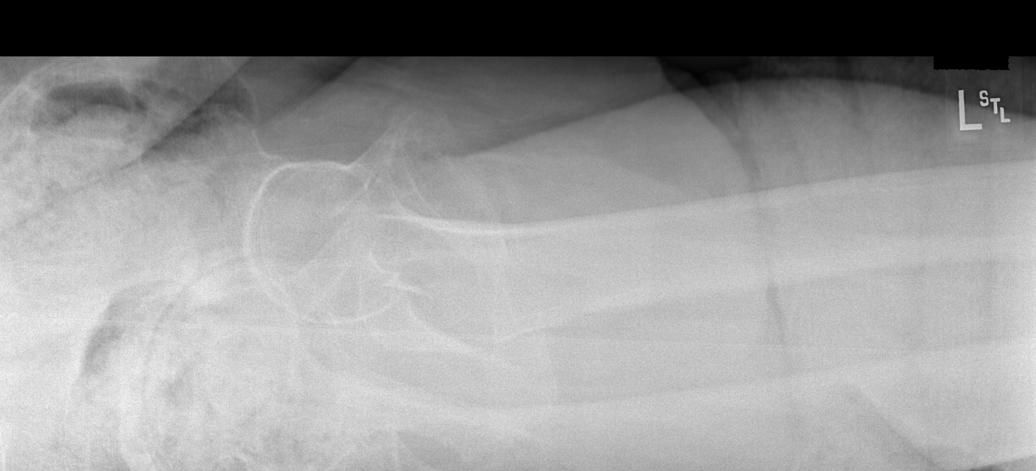

[w hip lat right]
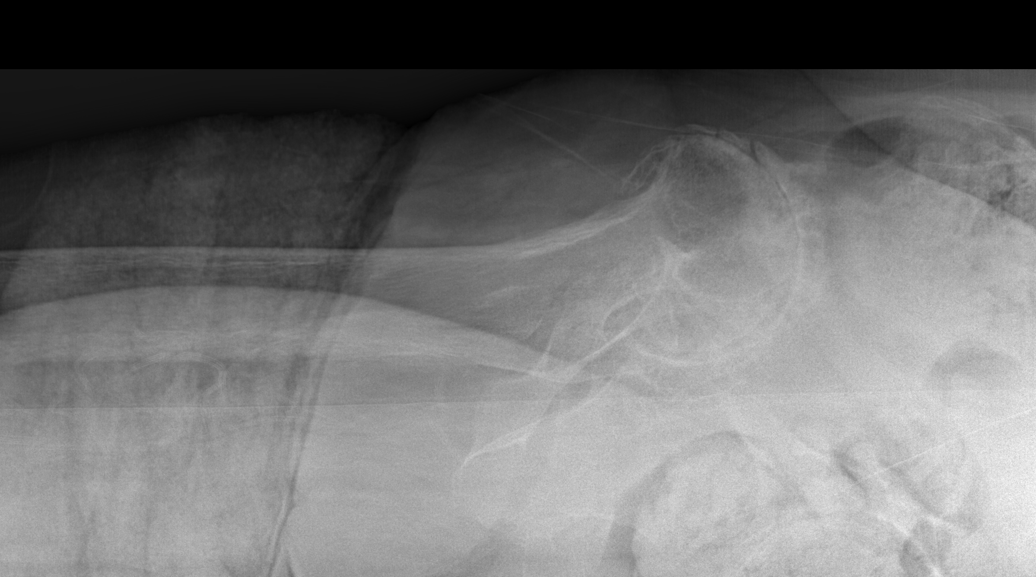

[5 of 5 positions shown; findings below may reference images not displayed]

FINDINGS: Pelvic ring is intact. Significant degenerative changes in the right
hip joint are noted with remodeling of the right femoral head. The
overall appearance is stable. No definitive fracture is noted. No
joint effusion is seen. Left hip demonstrates mild degenerative
change. No acute bony abnormality is seen. Sclerosis is noted in the
left iliac wing/acetabular stable from the prior study.
IMPRESSION: Overall stable appearance when compared with the prior exam. No
definitive fracture is seen. CT may be helpful for further
evaluation. Severe degenerative changes in the right hip joint are
noted.

## 2021-08-21 DIAGNOSIS — N183 Chronic kidney disease, stage 3 unspecified: Secondary | ICD-10-CM | POA: Diagnosis not present

## 2021-08-21 DIAGNOSIS — I150 Renovascular hypertension: Secondary | ICD-10-CM | POA: Diagnosis not present

## 2021-09-09 DIAGNOSIS — F0390 Unspecified dementia without behavioral disturbance: Secondary | ICD-10-CM | POA: Diagnosis not present

## 2021-09-09 DIAGNOSIS — N183 Chronic kidney disease, stage 3 unspecified: Secondary | ICD-10-CM | POA: Diagnosis not present

## 2021-09-09 DIAGNOSIS — I10 Essential (primary) hypertension: Secondary | ICD-10-CM | POA: Diagnosis not present

## 2021-09-09 DIAGNOSIS — D638 Anemia in other chronic diseases classified elsewhere: Secondary | ICD-10-CM | POA: Diagnosis not present

## 2021-09-19 DIAGNOSIS — N189 Chronic kidney disease, unspecified: Secondary | ICD-10-CM | POA: Diagnosis not present

## 2021-09-19 DIAGNOSIS — Z9181 History of falling: Secondary | ICD-10-CM | POA: Diagnosis not present

## 2021-09-19 DIAGNOSIS — R1312 Dysphagia, oropharyngeal phase: Secondary | ICD-10-CM | POA: Diagnosis not present

## 2021-09-19 DIAGNOSIS — R278 Other lack of coordination: Secondary | ICD-10-CM | POA: Diagnosis not present

## 2021-09-19 DIAGNOSIS — R262 Difficulty in walking, not elsewhere classified: Secondary | ICD-10-CM | POA: Diagnosis not present

## 2021-09-19 DIAGNOSIS — R2689 Other abnormalities of gait and mobility: Secondary | ICD-10-CM | POA: Diagnosis not present

## 2021-09-19 DIAGNOSIS — M6281 Muscle weakness (generalized): Secondary | ICD-10-CM | POA: Diagnosis not present

## 2021-09-19 DIAGNOSIS — R41841 Cognitive communication deficit: Secondary | ICD-10-CM | POA: Diagnosis not present

## 2021-09-19 DIAGNOSIS — R2681 Unsteadiness on feet: Secondary | ICD-10-CM | POA: Diagnosis not present

## 2021-09-19 DIAGNOSIS — R293 Abnormal posture: Secondary | ICD-10-CM | POA: Diagnosis not present

## 2021-09-19 DIAGNOSIS — I739 Peripheral vascular disease, unspecified: Secondary | ICD-10-CM | POA: Diagnosis not present

## 2021-09-19 DIAGNOSIS — S2242XD Multiple fractures of ribs, left side, subsequent encounter for fracture with routine healing: Secondary | ICD-10-CM | POA: Diagnosis not present

## 2021-09-22 DIAGNOSIS — S2242XD Multiple fractures of ribs, left side, subsequent encounter for fracture with routine healing: Secondary | ICD-10-CM | POA: Diagnosis not present

## 2021-09-22 DIAGNOSIS — R293 Abnormal posture: Secondary | ICD-10-CM | POA: Diagnosis not present

## 2021-09-22 DIAGNOSIS — R2681 Unsteadiness on feet: Secondary | ICD-10-CM | POA: Diagnosis not present

## 2021-09-22 DIAGNOSIS — N189 Chronic kidney disease, unspecified: Secondary | ICD-10-CM | POA: Diagnosis not present

## 2021-09-22 DIAGNOSIS — R1312 Dysphagia, oropharyngeal phase: Secondary | ICD-10-CM | POA: Diagnosis not present

## 2021-09-22 DIAGNOSIS — R41841 Cognitive communication deficit: Secondary | ICD-10-CM | POA: Diagnosis not present

## 2021-09-23 DIAGNOSIS — S2242XD Multiple fractures of ribs, left side, subsequent encounter for fracture with routine healing: Secondary | ICD-10-CM | POA: Diagnosis not present

## 2021-09-23 DIAGNOSIS — R2681 Unsteadiness on feet: Secondary | ICD-10-CM | POA: Diagnosis not present

## 2021-09-23 DIAGNOSIS — N189 Chronic kidney disease, unspecified: Secondary | ICD-10-CM | POA: Diagnosis not present

## 2021-09-23 DIAGNOSIS — R1312 Dysphagia, oropharyngeal phase: Secondary | ICD-10-CM | POA: Diagnosis not present

## 2021-09-23 DIAGNOSIS — R293 Abnormal posture: Secondary | ICD-10-CM | POA: Diagnosis not present

## 2021-09-23 DIAGNOSIS — R41841 Cognitive communication deficit: Secondary | ICD-10-CM | POA: Diagnosis not present

## 2021-09-24 DIAGNOSIS — R1312 Dysphagia, oropharyngeal phase: Secondary | ICD-10-CM | POA: Diagnosis not present

## 2021-09-24 DIAGNOSIS — R41841 Cognitive communication deficit: Secondary | ICD-10-CM | POA: Diagnosis not present

## 2021-09-24 DIAGNOSIS — N189 Chronic kidney disease, unspecified: Secondary | ICD-10-CM | POA: Diagnosis not present

## 2021-09-24 DIAGNOSIS — R293 Abnormal posture: Secondary | ICD-10-CM | POA: Diagnosis not present

## 2021-09-24 DIAGNOSIS — R2681 Unsteadiness on feet: Secondary | ICD-10-CM | POA: Diagnosis not present

## 2021-09-24 DIAGNOSIS — S2242XD Multiple fractures of ribs, left side, subsequent encounter for fracture with routine healing: Secondary | ICD-10-CM | POA: Diagnosis not present

## 2021-09-25 DIAGNOSIS — S2242XD Multiple fractures of ribs, left side, subsequent encounter for fracture with routine healing: Secondary | ICD-10-CM | POA: Diagnosis not present

## 2021-09-25 DIAGNOSIS — R293 Abnormal posture: Secondary | ICD-10-CM | POA: Diagnosis not present

## 2021-09-25 DIAGNOSIS — N189 Chronic kidney disease, unspecified: Secondary | ICD-10-CM | POA: Diagnosis not present

## 2021-09-25 DIAGNOSIS — R41841 Cognitive communication deficit: Secondary | ICD-10-CM | POA: Diagnosis not present

## 2021-09-25 DIAGNOSIS — R2681 Unsteadiness on feet: Secondary | ICD-10-CM | POA: Diagnosis not present

## 2021-09-25 DIAGNOSIS — R1312 Dysphagia, oropharyngeal phase: Secondary | ICD-10-CM | POA: Diagnosis not present

## 2021-09-26 DIAGNOSIS — R293 Abnormal posture: Secondary | ICD-10-CM | POA: Diagnosis not present

## 2021-09-26 DIAGNOSIS — R2681 Unsteadiness on feet: Secondary | ICD-10-CM | POA: Diagnosis not present

## 2021-09-26 DIAGNOSIS — S2242XD Multiple fractures of ribs, left side, subsequent encounter for fracture with routine healing: Secondary | ICD-10-CM | POA: Diagnosis not present

## 2021-09-26 DIAGNOSIS — N189 Chronic kidney disease, unspecified: Secondary | ICD-10-CM | POA: Diagnosis not present

## 2021-09-26 DIAGNOSIS — R1312 Dysphagia, oropharyngeal phase: Secondary | ICD-10-CM | POA: Diagnosis not present

## 2021-09-26 DIAGNOSIS — R41841 Cognitive communication deficit: Secondary | ICD-10-CM | POA: Diagnosis not present

## 2021-09-29 DIAGNOSIS — N189 Chronic kidney disease, unspecified: Secondary | ICD-10-CM | POA: Diagnosis not present

## 2021-09-29 DIAGNOSIS — R2681 Unsteadiness on feet: Secondary | ICD-10-CM | POA: Diagnosis not present

## 2021-09-29 DIAGNOSIS — R293 Abnormal posture: Secondary | ICD-10-CM | POA: Diagnosis not present

## 2021-09-29 DIAGNOSIS — S2242XD Multiple fractures of ribs, left side, subsequent encounter for fracture with routine healing: Secondary | ICD-10-CM | POA: Diagnosis not present

## 2021-09-29 DIAGNOSIS — R1312 Dysphagia, oropharyngeal phase: Secondary | ICD-10-CM | POA: Diagnosis not present

## 2021-09-29 DIAGNOSIS — R41841 Cognitive communication deficit: Secondary | ICD-10-CM | POA: Diagnosis not present

## 2021-09-30 DIAGNOSIS — S2242XD Multiple fractures of ribs, left side, subsequent encounter for fracture with routine healing: Secondary | ICD-10-CM | POA: Diagnosis not present

## 2021-09-30 DIAGNOSIS — R1312 Dysphagia, oropharyngeal phase: Secondary | ICD-10-CM | POA: Diagnosis not present

## 2021-09-30 DIAGNOSIS — R293 Abnormal posture: Secondary | ICD-10-CM | POA: Diagnosis not present

## 2021-09-30 DIAGNOSIS — N189 Chronic kidney disease, unspecified: Secondary | ICD-10-CM | POA: Diagnosis not present

## 2021-09-30 DIAGNOSIS — R2681 Unsteadiness on feet: Secondary | ICD-10-CM | POA: Diagnosis not present

## 2021-09-30 DIAGNOSIS — R41841 Cognitive communication deficit: Secondary | ICD-10-CM | POA: Diagnosis not present

## 2021-10-01 DIAGNOSIS — R2681 Unsteadiness on feet: Secondary | ICD-10-CM | POA: Diagnosis not present

## 2021-10-01 DIAGNOSIS — R1312 Dysphagia, oropharyngeal phase: Secondary | ICD-10-CM | POA: Diagnosis not present

## 2021-10-01 DIAGNOSIS — R41841 Cognitive communication deficit: Secondary | ICD-10-CM | POA: Diagnosis not present

## 2021-10-01 DIAGNOSIS — N189 Chronic kidney disease, unspecified: Secondary | ICD-10-CM | POA: Diagnosis not present

## 2021-10-01 DIAGNOSIS — S2242XD Multiple fractures of ribs, left side, subsequent encounter for fracture with routine healing: Secondary | ICD-10-CM | POA: Diagnosis not present

## 2021-10-01 DIAGNOSIS — R293 Abnormal posture: Secondary | ICD-10-CM | POA: Diagnosis not present

## 2021-10-02 DIAGNOSIS — S2242XD Multiple fractures of ribs, left side, subsequent encounter for fracture with routine healing: Secondary | ICD-10-CM | POA: Diagnosis not present

## 2021-10-02 DIAGNOSIS — R41841 Cognitive communication deficit: Secondary | ICD-10-CM | POA: Diagnosis not present

## 2021-10-02 DIAGNOSIS — N189 Chronic kidney disease, unspecified: Secondary | ICD-10-CM | POA: Diagnosis not present

## 2021-10-02 DIAGNOSIS — R1312 Dysphagia, oropharyngeal phase: Secondary | ICD-10-CM | POA: Diagnosis not present

## 2021-10-02 DIAGNOSIS — R293 Abnormal posture: Secondary | ICD-10-CM | POA: Diagnosis not present

## 2021-10-02 DIAGNOSIS — R2681 Unsteadiness on feet: Secondary | ICD-10-CM | POA: Diagnosis not present

## 2021-10-07 DIAGNOSIS — S2242XD Multiple fractures of ribs, left side, subsequent encounter for fracture with routine healing: Secondary | ICD-10-CM | POA: Diagnosis not present

## 2021-10-07 DIAGNOSIS — M6281 Muscle weakness (generalized): Secondary | ICD-10-CM | POA: Diagnosis not present

## 2021-10-07 DIAGNOSIS — N189 Chronic kidney disease, unspecified: Secondary | ICD-10-CM | POA: Diagnosis not present

## 2021-10-07 DIAGNOSIS — Z9181 History of falling: Secondary | ICD-10-CM | POA: Diagnosis not present

## 2021-10-07 DIAGNOSIS — I739 Peripheral vascular disease, unspecified: Secondary | ICD-10-CM | POA: Diagnosis not present

## 2021-10-07 DIAGNOSIS — R2689 Other abnormalities of gait and mobility: Secondary | ICD-10-CM | POA: Diagnosis not present

## 2021-10-07 DIAGNOSIS — R293 Abnormal posture: Secondary | ICD-10-CM | POA: Diagnosis not present

## 2021-10-07 DIAGNOSIS — R41841 Cognitive communication deficit: Secondary | ICD-10-CM | POA: Diagnosis not present

## 2021-10-07 DIAGNOSIS — R2681 Unsteadiness on feet: Secondary | ICD-10-CM | POA: Diagnosis not present

## 2021-10-07 DIAGNOSIS — R262 Difficulty in walking, not elsewhere classified: Secondary | ICD-10-CM | POA: Diagnosis not present

## 2021-10-07 DIAGNOSIS — R1312 Dysphagia, oropharyngeal phase: Secondary | ICD-10-CM | POA: Diagnosis not present

## 2021-10-07 DIAGNOSIS — R278 Other lack of coordination: Secondary | ICD-10-CM | POA: Diagnosis not present

## 2021-10-07 DIAGNOSIS — E079 Disorder of thyroid, unspecified: Secondary | ICD-10-CM | POA: Diagnosis not present

## 2021-10-08 DIAGNOSIS — E079 Disorder of thyroid, unspecified: Secondary | ICD-10-CM | POA: Diagnosis not present

## 2021-10-08 DIAGNOSIS — N189 Chronic kidney disease, unspecified: Secondary | ICD-10-CM | POA: Diagnosis not present

## 2021-10-08 DIAGNOSIS — R2681 Unsteadiness on feet: Secondary | ICD-10-CM | POA: Diagnosis not present

## 2021-10-08 DIAGNOSIS — R1312 Dysphagia, oropharyngeal phase: Secondary | ICD-10-CM | POA: Diagnosis not present

## 2021-10-08 DIAGNOSIS — Z9181 History of falling: Secondary | ICD-10-CM | POA: Diagnosis not present

## 2021-10-08 DIAGNOSIS — S2242XD Multiple fractures of ribs, left side, subsequent encounter for fracture with routine healing: Secondary | ICD-10-CM | POA: Diagnosis not present

## 2021-10-09 DIAGNOSIS — R2681 Unsteadiness on feet: Secondary | ICD-10-CM | POA: Diagnosis not present

## 2021-10-09 DIAGNOSIS — Z9181 History of falling: Secondary | ICD-10-CM | POA: Diagnosis not present

## 2021-10-09 DIAGNOSIS — S2242XD Multiple fractures of ribs, left side, subsequent encounter for fracture with routine healing: Secondary | ICD-10-CM | POA: Diagnosis not present

## 2021-10-09 DIAGNOSIS — R1312 Dysphagia, oropharyngeal phase: Secondary | ICD-10-CM | POA: Diagnosis not present

## 2021-10-09 DIAGNOSIS — N189 Chronic kidney disease, unspecified: Secondary | ICD-10-CM | POA: Diagnosis not present

## 2021-10-09 DIAGNOSIS — E079 Disorder of thyroid, unspecified: Secondary | ICD-10-CM | POA: Diagnosis not present

## 2021-10-10 DIAGNOSIS — R2681 Unsteadiness on feet: Secondary | ICD-10-CM | POA: Diagnosis not present

## 2021-10-10 DIAGNOSIS — E079 Disorder of thyroid, unspecified: Secondary | ICD-10-CM | POA: Diagnosis not present

## 2021-10-10 DIAGNOSIS — S2242XD Multiple fractures of ribs, left side, subsequent encounter for fracture with routine healing: Secondary | ICD-10-CM | POA: Diagnosis not present

## 2021-10-10 DIAGNOSIS — R1312 Dysphagia, oropharyngeal phase: Secondary | ICD-10-CM | POA: Diagnosis not present

## 2021-10-10 DIAGNOSIS — Z9181 History of falling: Secondary | ICD-10-CM | POA: Diagnosis not present

## 2021-10-10 DIAGNOSIS — N189 Chronic kidney disease, unspecified: Secondary | ICD-10-CM | POA: Diagnosis not present

## 2021-10-11 DIAGNOSIS — N189 Chronic kidney disease, unspecified: Secondary | ICD-10-CM | POA: Diagnosis not present

## 2021-10-11 DIAGNOSIS — R2681 Unsteadiness on feet: Secondary | ICD-10-CM | POA: Diagnosis not present

## 2021-10-11 DIAGNOSIS — E079 Disorder of thyroid, unspecified: Secondary | ICD-10-CM | POA: Diagnosis not present

## 2021-10-11 DIAGNOSIS — Z9181 History of falling: Secondary | ICD-10-CM | POA: Diagnosis not present

## 2021-10-11 DIAGNOSIS — R1312 Dysphagia, oropharyngeal phase: Secondary | ICD-10-CM | POA: Diagnosis not present

## 2021-10-11 DIAGNOSIS — S2242XD Multiple fractures of ribs, left side, subsequent encounter for fracture with routine healing: Secondary | ICD-10-CM | POA: Diagnosis not present

## 2021-10-12 DIAGNOSIS — R1312 Dysphagia, oropharyngeal phase: Secondary | ICD-10-CM | POA: Diagnosis not present

## 2021-10-12 DIAGNOSIS — N189 Chronic kidney disease, unspecified: Secondary | ICD-10-CM | POA: Diagnosis not present

## 2021-10-12 DIAGNOSIS — Z9181 History of falling: Secondary | ICD-10-CM | POA: Diagnosis not present

## 2021-10-12 DIAGNOSIS — E079 Disorder of thyroid, unspecified: Secondary | ICD-10-CM | POA: Diagnosis not present

## 2021-10-12 DIAGNOSIS — R2681 Unsteadiness on feet: Secondary | ICD-10-CM | POA: Diagnosis not present

## 2021-10-12 DIAGNOSIS — S2242XD Multiple fractures of ribs, left side, subsequent encounter for fracture with routine healing: Secondary | ICD-10-CM | POA: Diagnosis not present

## 2021-10-13 DIAGNOSIS — E079 Disorder of thyroid, unspecified: Secondary | ICD-10-CM | POA: Diagnosis not present

## 2021-10-13 DIAGNOSIS — S2242XD Multiple fractures of ribs, left side, subsequent encounter for fracture with routine healing: Secondary | ICD-10-CM | POA: Diagnosis not present

## 2021-10-13 DIAGNOSIS — R2681 Unsteadiness on feet: Secondary | ICD-10-CM | POA: Diagnosis not present

## 2021-10-13 DIAGNOSIS — Z9181 History of falling: Secondary | ICD-10-CM | POA: Diagnosis not present

## 2021-10-13 DIAGNOSIS — N189 Chronic kidney disease, unspecified: Secondary | ICD-10-CM | POA: Diagnosis not present

## 2021-10-13 DIAGNOSIS — R1312 Dysphagia, oropharyngeal phase: Secondary | ICD-10-CM | POA: Diagnosis not present

## 2021-10-15 DIAGNOSIS — R2681 Unsteadiness on feet: Secondary | ICD-10-CM | POA: Diagnosis not present

## 2021-10-15 DIAGNOSIS — Z9181 History of falling: Secondary | ICD-10-CM | POA: Diagnosis not present

## 2021-10-15 DIAGNOSIS — N189 Chronic kidney disease, unspecified: Secondary | ICD-10-CM | POA: Diagnosis not present

## 2021-10-15 DIAGNOSIS — S2242XD Multiple fractures of ribs, left side, subsequent encounter for fracture with routine healing: Secondary | ICD-10-CM | POA: Diagnosis not present

## 2021-10-15 DIAGNOSIS — R1312 Dysphagia, oropharyngeal phase: Secondary | ICD-10-CM | POA: Diagnosis not present

## 2021-10-15 DIAGNOSIS — E079 Disorder of thyroid, unspecified: Secondary | ICD-10-CM | POA: Diagnosis not present

## 2021-10-16 DIAGNOSIS — Z9181 History of falling: Secondary | ICD-10-CM | POA: Diagnosis not present

## 2021-10-16 DIAGNOSIS — R2681 Unsteadiness on feet: Secondary | ICD-10-CM | POA: Diagnosis not present

## 2021-10-16 DIAGNOSIS — R1312 Dysphagia, oropharyngeal phase: Secondary | ICD-10-CM | POA: Diagnosis not present

## 2021-10-16 DIAGNOSIS — N189 Chronic kidney disease, unspecified: Secondary | ICD-10-CM | POA: Diagnosis not present

## 2021-10-16 DIAGNOSIS — S2242XD Multiple fractures of ribs, left side, subsequent encounter for fracture with routine healing: Secondary | ICD-10-CM | POA: Diagnosis not present

## 2021-10-16 DIAGNOSIS — E079 Disorder of thyroid, unspecified: Secondary | ICD-10-CM | POA: Diagnosis not present

## 2021-10-17 DIAGNOSIS — R2681 Unsteadiness on feet: Secondary | ICD-10-CM | POA: Diagnosis not present

## 2021-10-17 DIAGNOSIS — E079 Disorder of thyroid, unspecified: Secondary | ICD-10-CM | POA: Diagnosis not present

## 2021-10-17 DIAGNOSIS — S2242XD Multiple fractures of ribs, left side, subsequent encounter for fracture with routine healing: Secondary | ICD-10-CM | POA: Diagnosis not present

## 2021-10-17 DIAGNOSIS — R1312 Dysphagia, oropharyngeal phase: Secondary | ICD-10-CM | POA: Diagnosis not present

## 2021-10-17 DIAGNOSIS — N189 Chronic kidney disease, unspecified: Secondary | ICD-10-CM | POA: Diagnosis not present

## 2021-10-17 DIAGNOSIS — Z9181 History of falling: Secondary | ICD-10-CM | POA: Diagnosis not present

## 2021-10-18 DIAGNOSIS — R1312 Dysphagia, oropharyngeal phase: Secondary | ICD-10-CM | POA: Diagnosis not present

## 2021-10-18 DIAGNOSIS — S2242XD Multiple fractures of ribs, left side, subsequent encounter for fracture with routine healing: Secondary | ICD-10-CM | POA: Diagnosis not present

## 2021-10-18 DIAGNOSIS — R2681 Unsteadiness on feet: Secondary | ICD-10-CM | POA: Diagnosis not present

## 2021-10-18 DIAGNOSIS — E079 Disorder of thyroid, unspecified: Secondary | ICD-10-CM | POA: Diagnosis not present

## 2021-10-18 DIAGNOSIS — Z9181 History of falling: Secondary | ICD-10-CM | POA: Diagnosis not present

## 2021-10-18 DIAGNOSIS — N189 Chronic kidney disease, unspecified: Secondary | ICD-10-CM | POA: Diagnosis not present

## 2021-10-20 DIAGNOSIS — Z9181 History of falling: Secondary | ICD-10-CM | POA: Diagnosis not present

## 2021-10-20 DIAGNOSIS — E079 Disorder of thyroid, unspecified: Secondary | ICD-10-CM | POA: Diagnosis not present

## 2021-10-20 DIAGNOSIS — R1312 Dysphagia, oropharyngeal phase: Secondary | ICD-10-CM | POA: Diagnosis not present

## 2021-10-20 DIAGNOSIS — N189 Chronic kidney disease, unspecified: Secondary | ICD-10-CM | POA: Diagnosis not present

## 2021-10-20 DIAGNOSIS — R2681 Unsteadiness on feet: Secondary | ICD-10-CM | POA: Diagnosis not present

## 2021-10-20 DIAGNOSIS — S2242XD Multiple fractures of ribs, left side, subsequent encounter for fracture with routine healing: Secondary | ICD-10-CM | POA: Diagnosis not present

## 2021-10-21 DIAGNOSIS — N189 Chronic kidney disease, unspecified: Secondary | ICD-10-CM | POA: Diagnosis not present

## 2021-10-21 DIAGNOSIS — Z9181 History of falling: Secondary | ICD-10-CM | POA: Diagnosis not present

## 2021-10-21 DIAGNOSIS — E079 Disorder of thyroid, unspecified: Secondary | ICD-10-CM | POA: Diagnosis not present

## 2021-10-21 DIAGNOSIS — R1312 Dysphagia, oropharyngeal phase: Secondary | ICD-10-CM | POA: Diagnosis not present

## 2021-10-21 DIAGNOSIS — S2242XD Multiple fractures of ribs, left side, subsequent encounter for fracture with routine healing: Secondary | ICD-10-CM | POA: Diagnosis not present

## 2021-10-21 DIAGNOSIS — R2681 Unsteadiness on feet: Secondary | ICD-10-CM | POA: Diagnosis not present

## 2021-10-22 DIAGNOSIS — R2681 Unsteadiness on feet: Secondary | ICD-10-CM | POA: Diagnosis not present

## 2021-10-22 DIAGNOSIS — S2242XD Multiple fractures of ribs, left side, subsequent encounter for fracture with routine healing: Secondary | ICD-10-CM | POA: Diagnosis not present

## 2021-10-22 DIAGNOSIS — R1312 Dysphagia, oropharyngeal phase: Secondary | ICD-10-CM | POA: Diagnosis not present

## 2021-10-22 DIAGNOSIS — Z9181 History of falling: Secondary | ICD-10-CM | POA: Diagnosis not present

## 2021-10-22 DIAGNOSIS — E079 Disorder of thyroid, unspecified: Secondary | ICD-10-CM | POA: Diagnosis not present

## 2021-10-22 DIAGNOSIS — N189 Chronic kidney disease, unspecified: Secondary | ICD-10-CM | POA: Diagnosis not present

## 2021-10-23 DIAGNOSIS — R1312 Dysphagia, oropharyngeal phase: Secondary | ICD-10-CM | POA: Diagnosis not present

## 2021-10-23 DIAGNOSIS — S2242XD Multiple fractures of ribs, left side, subsequent encounter for fracture with routine healing: Secondary | ICD-10-CM | POA: Diagnosis not present

## 2021-10-23 DIAGNOSIS — R2681 Unsteadiness on feet: Secondary | ICD-10-CM | POA: Diagnosis not present

## 2021-10-23 DIAGNOSIS — N189 Chronic kidney disease, unspecified: Secondary | ICD-10-CM | POA: Diagnosis not present

## 2021-10-23 DIAGNOSIS — E079 Disorder of thyroid, unspecified: Secondary | ICD-10-CM | POA: Diagnosis not present

## 2021-10-23 DIAGNOSIS — Z9181 History of falling: Secondary | ICD-10-CM | POA: Diagnosis not present

## 2021-10-24 DIAGNOSIS — N189 Chronic kidney disease, unspecified: Secondary | ICD-10-CM | POA: Diagnosis not present

## 2021-10-24 DIAGNOSIS — R1312 Dysphagia, oropharyngeal phase: Secondary | ICD-10-CM | POA: Diagnosis not present

## 2021-10-24 DIAGNOSIS — R2681 Unsteadiness on feet: Secondary | ICD-10-CM | POA: Diagnosis not present

## 2021-10-24 DIAGNOSIS — S2242XD Multiple fractures of ribs, left side, subsequent encounter for fracture with routine healing: Secondary | ICD-10-CM | POA: Diagnosis not present

## 2021-10-24 DIAGNOSIS — Z9181 History of falling: Secondary | ICD-10-CM | POA: Diagnosis not present

## 2021-10-24 DIAGNOSIS — E079 Disorder of thyroid, unspecified: Secondary | ICD-10-CM | POA: Diagnosis not present

## 2021-10-25 DIAGNOSIS — S2242XD Multiple fractures of ribs, left side, subsequent encounter for fracture with routine healing: Secondary | ICD-10-CM | POA: Diagnosis not present

## 2021-10-25 DIAGNOSIS — Z9181 History of falling: Secondary | ICD-10-CM | POA: Diagnosis not present

## 2021-10-25 DIAGNOSIS — R2681 Unsteadiness on feet: Secondary | ICD-10-CM | POA: Diagnosis not present

## 2021-10-25 DIAGNOSIS — E079 Disorder of thyroid, unspecified: Secondary | ICD-10-CM | POA: Diagnosis not present

## 2021-10-25 DIAGNOSIS — N189 Chronic kidney disease, unspecified: Secondary | ICD-10-CM | POA: Diagnosis not present

## 2021-10-25 DIAGNOSIS — R1312 Dysphagia, oropharyngeal phase: Secondary | ICD-10-CM | POA: Diagnosis not present

## 2021-10-26 DIAGNOSIS — R2681 Unsteadiness on feet: Secondary | ICD-10-CM | POA: Diagnosis not present

## 2021-10-26 DIAGNOSIS — S2242XD Multiple fractures of ribs, left side, subsequent encounter for fracture with routine healing: Secondary | ICD-10-CM | POA: Diagnosis not present

## 2021-10-26 DIAGNOSIS — N189 Chronic kidney disease, unspecified: Secondary | ICD-10-CM | POA: Diagnosis not present

## 2021-10-26 DIAGNOSIS — R1312 Dysphagia, oropharyngeal phase: Secondary | ICD-10-CM | POA: Diagnosis not present

## 2021-10-26 DIAGNOSIS — Z9181 History of falling: Secondary | ICD-10-CM | POA: Diagnosis not present

## 2021-10-26 DIAGNOSIS — E079 Disorder of thyroid, unspecified: Secondary | ICD-10-CM | POA: Diagnosis not present

## 2021-10-27 DIAGNOSIS — E079 Disorder of thyroid, unspecified: Secondary | ICD-10-CM | POA: Diagnosis not present

## 2021-10-27 DIAGNOSIS — S2242XD Multiple fractures of ribs, left side, subsequent encounter for fracture with routine healing: Secondary | ICD-10-CM | POA: Diagnosis not present

## 2021-10-27 DIAGNOSIS — N189 Chronic kidney disease, unspecified: Secondary | ICD-10-CM | POA: Diagnosis not present

## 2021-10-27 DIAGNOSIS — R2681 Unsteadiness on feet: Secondary | ICD-10-CM | POA: Diagnosis not present

## 2021-10-27 DIAGNOSIS — Z9181 History of falling: Secondary | ICD-10-CM | POA: Diagnosis not present

## 2021-10-27 DIAGNOSIS — R1312 Dysphagia, oropharyngeal phase: Secondary | ICD-10-CM | POA: Diagnosis not present

## 2021-10-28 DIAGNOSIS — S2242XD Multiple fractures of ribs, left side, subsequent encounter for fracture with routine healing: Secondary | ICD-10-CM | POA: Diagnosis not present

## 2021-10-28 DIAGNOSIS — N189 Chronic kidney disease, unspecified: Secondary | ICD-10-CM | POA: Diagnosis not present

## 2021-10-28 DIAGNOSIS — E079 Disorder of thyroid, unspecified: Secondary | ICD-10-CM | POA: Diagnosis not present

## 2021-10-28 DIAGNOSIS — R1312 Dysphagia, oropharyngeal phase: Secondary | ICD-10-CM | POA: Diagnosis not present

## 2021-10-28 DIAGNOSIS — Z9181 History of falling: Secondary | ICD-10-CM | POA: Diagnosis not present

## 2021-10-28 DIAGNOSIS — R2681 Unsteadiness on feet: Secondary | ICD-10-CM | POA: Diagnosis not present

## 2021-10-29 DIAGNOSIS — R2681 Unsteadiness on feet: Secondary | ICD-10-CM | POA: Diagnosis not present

## 2021-10-29 DIAGNOSIS — Z9181 History of falling: Secondary | ICD-10-CM | POA: Diagnosis not present

## 2021-10-29 DIAGNOSIS — S2242XD Multiple fractures of ribs, left side, subsequent encounter for fracture with routine healing: Secondary | ICD-10-CM | POA: Diagnosis not present

## 2021-10-29 DIAGNOSIS — N189 Chronic kidney disease, unspecified: Secondary | ICD-10-CM | POA: Diagnosis not present

## 2021-10-29 DIAGNOSIS — E079 Disorder of thyroid, unspecified: Secondary | ICD-10-CM | POA: Diagnosis not present

## 2021-10-29 DIAGNOSIS — R1312 Dysphagia, oropharyngeal phase: Secondary | ICD-10-CM | POA: Diagnosis not present

## 2021-10-30 DIAGNOSIS — E079 Disorder of thyroid, unspecified: Secondary | ICD-10-CM | POA: Diagnosis not present

## 2021-10-30 DIAGNOSIS — Z9181 History of falling: Secondary | ICD-10-CM | POA: Diagnosis not present

## 2021-10-30 DIAGNOSIS — R2681 Unsteadiness on feet: Secondary | ICD-10-CM | POA: Diagnosis not present

## 2021-10-30 DIAGNOSIS — N189 Chronic kidney disease, unspecified: Secondary | ICD-10-CM | POA: Diagnosis not present

## 2021-10-30 DIAGNOSIS — R1312 Dysphagia, oropharyngeal phase: Secondary | ICD-10-CM | POA: Diagnosis not present

## 2021-10-30 DIAGNOSIS — S2242XD Multiple fractures of ribs, left side, subsequent encounter for fracture with routine healing: Secondary | ICD-10-CM | POA: Diagnosis not present

## 2021-10-31 DIAGNOSIS — R1312 Dysphagia, oropharyngeal phase: Secondary | ICD-10-CM | POA: Diagnosis not present

## 2021-10-31 DIAGNOSIS — S2242XD Multiple fractures of ribs, left side, subsequent encounter for fracture with routine healing: Secondary | ICD-10-CM | POA: Diagnosis not present

## 2021-10-31 DIAGNOSIS — E079 Disorder of thyroid, unspecified: Secondary | ICD-10-CM | POA: Diagnosis not present

## 2021-10-31 DIAGNOSIS — Z9181 History of falling: Secondary | ICD-10-CM | POA: Diagnosis not present

## 2021-10-31 DIAGNOSIS — N189 Chronic kidney disease, unspecified: Secondary | ICD-10-CM | POA: Diagnosis not present

## 2021-10-31 DIAGNOSIS — R2681 Unsteadiness on feet: Secondary | ICD-10-CM | POA: Diagnosis not present

## 2021-11-01 DIAGNOSIS — E079 Disorder of thyroid, unspecified: Secondary | ICD-10-CM | POA: Diagnosis not present

## 2021-11-01 DIAGNOSIS — Z9181 History of falling: Secondary | ICD-10-CM | POA: Diagnosis not present

## 2021-11-01 DIAGNOSIS — R1312 Dysphagia, oropharyngeal phase: Secondary | ICD-10-CM | POA: Diagnosis not present

## 2021-11-01 DIAGNOSIS — N189 Chronic kidney disease, unspecified: Secondary | ICD-10-CM | POA: Diagnosis not present

## 2021-11-01 DIAGNOSIS — S2242XD Multiple fractures of ribs, left side, subsequent encounter for fracture with routine healing: Secondary | ICD-10-CM | POA: Diagnosis not present

## 2021-11-01 DIAGNOSIS — R2681 Unsteadiness on feet: Secondary | ICD-10-CM | POA: Diagnosis not present

## 2021-11-02 DIAGNOSIS — R2681 Unsteadiness on feet: Secondary | ICD-10-CM | POA: Diagnosis not present

## 2021-11-02 DIAGNOSIS — N189 Chronic kidney disease, unspecified: Secondary | ICD-10-CM | POA: Diagnosis not present

## 2021-11-02 DIAGNOSIS — Z9181 History of falling: Secondary | ICD-10-CM | POA: Diagnosis not present

## 2021-11-02 DIAGNOSIS — R1312 Dysphagia, oropharyngeal phase: Secondary | ICD-10-CM | POA: Diagnosis not present

## 2021-11-02 DIAGNOSIS — S2242XD Multiple fractures of ribs, left side, subsequent encounter for fracture with routine healing: Secondary | ICD-10-CM | POA: Diagnosis not present

## 2021-11-02 DIAGNOSIS — E079 Disorder of thyroid, unspecified: Secondary | ICD-10-CM | POA: Diagnosis not present

## 2021-11-03 DIAGNOSIS — N189 Chronic kidney disease, unspecified: Secondary | ICD-10-CM | POA: Diagnosis not present

## 2021-11-03 DIAGNOSIS — R2681 Unsteadiness on feet: Secondary | ICD-10-CM | POA: Diagnosis not present

## 2021-11-03 DIAGNOSIS — E079 Disorder of thyroid, unspecified: Secondary | ICD-10-CM | POA: Diagnosis not present

## 2021-11-03 DIAGNOSIS — Z9181 History of falling: Secondary | ICD-10-CM | POA: Diagnosis not present

## 2021-11-03 DIAGNOSIS — S2242XD Multiple fractures of ribs, left side, subsequent encounter for fracture with routine healing: Secondary | ICD-10-CM | POA: Diagnosis not present

## 2021-11-03 DIAGNOSIS — F03918 Unspecified dementia, unspecified severity, with other behavioral disturbance: Secondary | ICD-10-CM | POA: Diagnosis not present

## 2021-11-03 DIAGNOSIS — R1312 Dysphagia, oropharyngeal phase: Secondary | ICD-10-CM | POA: Diagnosis not present

## 2021-11-04 DIAGNOSIS — S2242XD Multiple fractures of ribs, left side, subsequent encounter for fracture with routine healing: Secondary | ICD-10-CM | POA: Diagnosis not present

## 2021-11-04 DIAGNOSIS — R1312 Dysphagia, oropharyngeal phase: Secondary | ICD-10-CM | POA: Diagnosis not present

## 2021-11-04 DIAGNOSIS — N189 Chronic kidney disease, unspecified: Secondary | ICD-10-CM | POA: Diagnosis not present

## 2021-11-04 DIAGNOSIS — R2681 Unsteadiness on feet: Secondary | ICD-10-CM | POA: Diagnosis not present

## 2021-11-04 DIAGNOSIS — E079 Disorder of thyroid, unspecified: Secondary | ICD-10-CM | POA: Diagnosis not present

## 2021-11-04 DIAGNOSIS — Z9181 History of falling: Secondary | ICD-10-CM | POA: Diagnosis not present

## 2021-11-05 DIAGNOSIS — S2242XD Multiple fractures of ribs, left side, subsequent encounter for fracture with routine healing: Secondary | ICD-10-CM | POA: Diagnosis not present

## 2021-11-05 DIAGNOSIS — R2681 Unsteadiness on feet: Secondary | ICD-10-CM | POA: Diagnosis not present

## 2021-11-05 DIAGNOSIS — N189 Chronic kidney disease, unspecified: Secondary | ICD-10-CM | POA: Diagnosis not present

## 2021-11-05 DIAGNOSIS — E079 Disorder of thyroid, unspecified: Secondary | ICD-10-CM | POA: Diagnosis not present

## 2021-11-05 DIAGNOSIS — Z9181 History of falling: Secondary | ICD-10-CM | POA: Diagnosis not present

## 2021-11-05 DIAGNOSIS — R1312 Dysphagia, oropharyngeal phase: Secondary | ICD-10-CM | POA: Diagnosis not present

## 2021-11-06 DIAGNOSIS — R2689 Other abnormalities of gait and mobility: Secondary | ICD-10-CM | POA: Diagnosis not present

## 2021-11-06 DIAGNOSIS — R262 Difficulty in walking, not elsewhere classified: Secondary | ICD-10-CM | POA: Diagnosis not present

## 2021-11-06 DIAGNOSIS — E079 Disorder of thyroid, unspecified: Secondary | ICD-10-CM | POA: Diagnosis not present

## 2021-11-06 DIAGNOSIS — R41841 Cognitive communication deficit: Secondary | ICD-10-CM | POA: Diagnosis not present

## 2021-11-06 DIAGNOSIS — R1312 Dysphagia, oropharyngeal phase: Secondary | ICD-10-CM | POA: Diagnosis not present

## 2021-11-06 DIAGNOSIS — R293 Abnormal posture: Secondary | ICD-10-CM | POA: Diagnosis not present

## 2021-11-06 DIAGNOSIS — M6281 Muscle weakness (generalized): Secondary | ICD-10-CM | POA: Diagnosis not present

## 2021-11-06 DIAGNOSIS — N189 Chronic kidney disease, unspecified: Secondary | ICD-10-CM | POA: Diagnosis not present

## 2021-11-06 DIAGNOSIS — I739 Peripheral vascular disease, unspecified: Secondary | ICD-10-CM | POA: Diagnosis not present

## 2021-11-06 DIAGNOSIS — R278 Other lack of coordination: Secondary | ICD-10-CM | POA: Diagnosis not present

## 2021-11-06 DIAGNOSIS — Z9181 History of falling: Secondary | ICD-10-CM | POA: Diagnosis not present

## 2021-11-06 DIAGNOSIS — S2242XD Multiple fractures of ribs, left side, subsequent encounter for fracture with routine healing: Secondary | ICD-10-CM | POA: Diagnosis not present

## 2021-11-06 DIAGNOSIS — R2681 Unsteadiness on feet: Secondary | ICD-10-CM | POA: Diagnosis not present

## 2021-11-07 DIAGNOSIS — S2242XD Multiple fractures of ribs, left side, subsequent encounter for fracture with routine healing: Secondary | ICD-10-CM | POA: Diagnosis not present

## 2021-11-07 DIAGNOSIS — Z9181 History of falling: Secondary | ICD-10-CM | POA: Diagnosis not present

## 2021-11-07 DIAGNOSIS — R1312 Dysphagia, oropharyngeal phase: Secondary | ICD-10-CM | POA: Diagnosis not present

## 2021-11-07 DIAGNOSIS — N189 Chronic kidney disease, unspecified: Secondary | ICD-10-CM | POA: Diagnosis not present

## 2021-11-07 DIAGNOSIS — E079 Disorder of thyroid, unspecified: Secondary | ICD-10-CM | POA: Diagnosis not present

## 2021-11-07 DIAGNOSIS — R2681 Unsteadiness on feet: Secondary | ICD-10-CM | POA: Diagnosis not present

## 2021-11-08 DIAGNOSIS — N189 Chronic kidney disease, unspecified: Secondary | ICD-10-CM | POA: Diagnosis not present

## 2021-11-08 DIAGNOSIS — R1312 Dysphagia, oropharyngeal phase: Secondary | ICD-10-CM | POA: Diagnosis not present

## 2021-11-08 DIAGNOSIS — Z9181 History of falling: Secondary | ICD-10-CM | POA: Diagnosis not present

## 2021-11-08 DIAGNOSIS — R2681 Unsteadiness on feet: Secondary | ICD-10-CM | POA: Diagnosis not present

## 2021-11-08 DIAGNOSIS — S2242XD Multiple fractures of ribs, left side, subsequent encounter for fracture with routine healing: Secondary | ICD-10-CM | POA: Diagnosis not present

## 2021-11-08 DIAGNOSIS — E079 Disorder of thyroid, unspecified: Secondary | ICD-10-CM | POA: Diagnosis not present

## 2021-11-09 DIAGNOSIS — R2681 Unsteadiness on feet: Secondary | ICD-10-CM | POA: Diagnosis not present

## 2021-11-09 DIAGNOSIS — Z9181 History of falling: Secondary | ICD-10-CM | POA: Diagnosis not present

## 2021-11-09 DIAGNOSIS — R1312 Dysphagia, oropharyngeal phase: Secondary | ICD-10-CM | POA: Diagnosis not present

## 2021-11-09 DIAGNOSIS — E079 Disorder of thyroid, unspecified: Secondary | ICD-10-CM | POA: Diagnosis not present

## 2021-11-09 DIAGNOSIS — S2242XD Multiple fractures of ribs, left side, subsequent encounter for fracture with routine healing: Secondary | ICD-10-CM | POA: Diagnosis not present

## 2021-11-09 DIAGNOSIS — N189 Chronic kidney disease, unspecified: Secondary | ICD-10-CM | POA: Diagnosis not present

## 2021-11-10 DIAGNOSIS — R2681 Unsteadiness on feet: Secondary | ICD-10-CM | POA: Diagnosis not present

## 2021-11-10 DIAGNOSIS — E079 Disorder of thyroid, unspecified: Secondary | ICD-10-CM | POA: Diagnosis not present

## 2021-11-10 DIAGNOSIS — N189 Chronic kidney disease, unspecified: Secondary | ICD-10-CM | POA: Diagnosis not present

## 2021-11-10 DIAGNOSIS — R1312 Dysphagia, oropharyngeal phase: Secondary | ICD-10-CM | POA: Diagnosis not present

## 2021-11-10 DIAGNOSIS — Z9181 History of falling: Secondary | ICD-10-CM | POA: Diagnosis not present

## 2021-11-10 DIAGNOSIS — S2242XD Multiple fractures of ribs, left side, subsequent encounter for fracture with routine healing: Secondary | ICD-10-CM | POA: Diagnosis not present

## 2021-11-11 DIAGNOSIS — E079 Disorder of thyroid, unspecified: Secondary | ICD-10-CM | POA: Diagnosis not present

## 2021-11-11 DIAGNOSIS — R2681 Unsteadiness on feet: Secondary | ICD-10-CM | POA: Diagnosis not present

## 2021-11-11 DIAGNOSIS — S2242XD Multiple fractures of ribs, left side, subsequent encounter for fracture with routine healing: Secondary | ICD-10-CM | POA: Diagnosis not present

## 2021-11-11 DIAGNOSIS — I150 Renovascular hypertension: Secondary | ICD-10-CM | POA: Diagnosis not present

## 2021-11-11 DIAGNOSIS — F22 Delusional disorders: Secondary | ICD-10-CM | POA: Diagnosis not present

## 2021-11-11 DIAGNOSIS — R262 Difficulty in walking, not elsewhere classified: Secondary | ICD-10-CM | POA: Diagnosis not present

## 2021-11-11 DIAGNOSIS — R54 Age-related physical debility: Secondary | ICD-10-CM | POA: Diagnosis not present

## 2021-11-11 DIAGNOSIS — Z9181 History of falling: Secondary | ICD-10-CM | POA: Diagnosis not present

## 2021-11-11 DIAGNOSIS — I129 Hypertensive chronic kidney disease with stage 1 through stage 4 chronic kidney disease, or unspecified chronic kidney disease: Secondary | ICD-10-CM | POA: Diagnosis not present

## 2021-11-11 DIAGNOSIS — F0390 Unspecified dementia without behavioral disturbance: Secondary | ICD-10-CM | POA: Diagnosis not present

## 2021-11-11 DIAGNOSIS — R1312 Dysphagia, oropharyngeal phase: Secondary | ICD-10-CM | POA: Diagnosis not present

## 2021-11-11 DIAGNOSIS — I739 Peripheral vascular disease, unspecified: Secondary | ICD-10-CM | POA: Diagnosis not present

## 2021-11-11 DIAGNOSIS — N189 Chronic kidney disease, unspecified: Secondary | ICD-10-CM | POA: Diagnosis not present

## 2021-11-12 DIAGNOSIS — S2242XD Multiple fractures of ribs, left side, subsequent encounter for fracture with routine healing: Secondary | ICD-10-CM | POA: Diagnosis not present

## 2021-11-12 DIAGNOSIS — N189 Chronic kidney disease, unspecified: Secondary | ICD-10-CM | POA: Diagnosis not present

## 2021-11-12 DIAGNOSIS — R2681 Unsteadiness on feet: Secondary | ICD-10-CM | POA: Diagnosis not present

## 2021-11-12 DIAGNOSIS — Z9181 History of falling: Secondary | ICD-10-CM | POA: Diagnosis not present

## 2021-11-12 DIAGNOSIS — R1312 Dysphagia, oropharyngeal phase: Secondary | ICD-10-CM | POA: Diagnosis not present

## 2021-11-12 DIAGNOSIS — E079 Disorder of thyroid, unspecified: Secondary | ICD-10-CM | POA: Diagnosis not present

## 2021-11-13 DIAGNOSIS — S2242XD Multiple fractures of ribs, left side, subsequent encounter for fracture with routine healing: Secondary | ICD-10-CM | POA: Diagnosis not present

## 2021-11-13 DIAGNOSIS — Z9181 History of falling: Secondary | ICD-10-CM | POA: Diagnosis not present

## 2021-11-13 DIAGNOSIS — R2681 Unsteadiness on feet: Secondary | ICD-10-CM | POA: Diagnosis not present

## 2021-11-13 DIAGNOSIS — N189 Chronic kidney disease, unspecified: Secondary | ICD-10-CM | POA: Diagnosis not present

## 2021-11-13 DIAGNOSIS — R1312 Dysphagia, oropharyngeal phase: Secondary | ICD-10-CM | POA: Diagnosis not present

## 2021-11-13 DIAGNOSIS — E079 Disorder of thyroid, unspecified: Secondary | ICD-10-CM | POA: Diagnosis not present

## 2021-11-14 DIAGNOSIS — R2681 Unsteadiness on feet: Secondary | ICD-10-CM | POA: Diagnosis not present

## 2021-11-14 DIAGNOSIS — Z9181 History of falling: Secondary | ICD-10-CM | POA: Diagnosis not present

## 2021-11-14 DIAGNOSIS — R1312 Dysphagia, oropharyngeal phase: Secondary | ICD-10-CM | POA: Diagnosis not present

## 2021-11-14 DIAGNOSIS — S2242XD Multiple fractures of ribs, left side, subsequent encounter for fracture with routine healing: Secondary | ICD-10-CM | POA: Diagnosis not present

## 2021-11-14 DIAGNOSIS — N189 Chronic kidney disease, unspecified: Secondary | ICD-10-CM | POA: Diagnosis not present

## 2021-11-14 DIAGNOSIS — E079 Disorder of thyroid, unspecified: Secondary | ICD-10-CM | POA: Diagnosis not present

## 2021-11-15 DIAGNOSIS — S2242XD Multiple fractures of ribs, left side, subsequent encounter for fracture with routine healing: Secondary | ICD-10-CM | POA: Diagnosis not present

## 2021-11-15 DIAGNOSIS — R2681 Unsteadiness on feet: Secondary | ICD-10-CM | POA: Diagnosis not present

## 2021-11-15 DIAGNOSIS — Z9181 History of falling: Secondary | ICD-10-CM | POA: Diagnosis not present

## 2021-11-15 DIAGNOSIS — R1312 Dysphagia, oropharyngeal phase: Secondary | ICD-10-CM | POA: Diagnosis not present

## 2021-11-15 DIAGNOSIS — E079 Disorder of thyroid, unspecified: Secondary | ICD-10-CM | POA: Diagnosis not present

## 2021-11-15 DIAGNOSIS — N189 Chronic kidney disease, unspecified: Secondary | ICD-10-CM | POA: Diagnosis not present

## 2021-11-16 DIAGNOSIS — Z9181 History of falling: Secondary | ICD-10-CM | POA: Diagnosis not present

## 2021-11-16 DIAGNOSIS — R1312 Dysphagia, oropharyngeal phase: Secondary | ICD-10-CM | POA: Diagnosis not present

## 2021-11-16 DIAGNOSIS — S2242XD Multiple fractures of ribs, left side, subsequent encounter for fracture with routine healing: Secondary | ICD-10-CM | POA: Diagnosis not present

## 2021-11-16 DIAGNOSIS — N189 Chronic kidney disease, unspecified: Secondary | ICD-10-CM | POA: Diagnosis not present

## 2021-11-16 DIAGNOSIS — E079 Disorder of thyroid, unspecified: Secondary | ICD-10-CM | POA: Diagnosis not present

## 2021-11-16 DIAGNOSIS — R2681 Unsteadiness on feet: Secondary | ICD-10-CM | POA: Diagnosis not present

## 2021-11-17 DIAGNOSIS — N189 Chronic kidney disease, unspecified: Secondary | ICD-10-CM | POA: Diagnosis not present

## 2021-11-17 DIAGNOSIS — Z9181 History of falling: Secondary | ICD-10-CM | POA: Diagnosis not present

## 2021-11-17 DIAGNOSIS — R1312 Dysphagia, oropharyngeal phase: Secondary | ICD-10-CM | POA: Diagnosis not present

## 2021-11-17 DIAGNOSIS — S2242XD Multiple fractures of ribs, left side, subsequent encounter for fracture with routine healing: Secondary | ICD-10-CM | POA: Diagnosis not present

## 2021-11-17 DIAGNOSIS — E079 Disorder of thyroid, unspecified: Secondary | ICD-10-CM | POA: Diagnosis not present

## 2021-11-17 DIAGNOSIS — R2681 Unsteadiness on feet: Secondary | ICD-10-CM | POA: Diagnosis not present

## 2021-11-18 DIAGNOSIS — S2242XD Multiple fractures of ribs, left side, subsequent encounter for fracture with routine healing: Secondary | ICD-10-CM | POA: Diagnosis not present

## 2021-11-18 DIAGNOSIS — N189 Chronic kidney disease, unspecified: Secondary | ICD-10-CM | POA: Diagnosis not present

## 2021-11-18 DIAGNOSIS — E079 Disorder of thyroid, unspecified: Secondary | ICD-10-CM | POA: Diagnosis not present

## 2021-11-18 DIAGNOSIS — R2681 Unsteadiness on feet: Secondary | ICD-10-CM | POA: Diagnosis not present

## 2021-11-18 DIAGNOSIS — Z9181 History of falling: Secondary | ICD-10-CM | POA: Diagnosis not present

## 2021-11-18 DIAGNOSIS — R1312 Dysphagia, oropharyngeal phase: Secondary | ICD-10-CM | POA: Diagnosis not present

## 2021-11-19 DIAGNOSIS — R2681 Unsteadiness on feet: Secondary | ICD-10-CM | POA: Diagnosis not present

## 2021-11-19 DIAGNOSIS — E079 Disorder of thyroid, unspecified: Secondary | ICD-10-CM | POA: Diagnosis not present

## 2021-11-19 DIAGNOSIS — S2242XD Multiple fractures of ribs, left side, subsequent encounter for fracture with routine healing: Secondary | ICD-10-CM | POA: Diagnosis not present

## 2021-11-19 DIAGNOSIS — R1312 Dysphagia, oropharyngeal phase: Secondary | ICD-10-CM | POA: Diagnosis not present

## 2021-11-19 DIAGNOSIS — N189 Chronic kidney disease, unspecified: Secondary | ICD-10-CM | POA: Diagnosis not present

## 2021-11-19 DIAGNOSIS — Z9181 History of falling: Secondary | ICD-10-CM | POA: Diagnosis not present

## 2021-11-20 DIAGNOSIS — S2242XD Multiple fractures of ribs, left side, subsequent encounter for fracture with routine healing: Secondary | ICD-10-CM | POA: Diagnosis not present

## 2021-11-20 DIAGNOSIS — R2681 Unsteadiness on feet: Secondary | ICD-10-CM | POA: Diagnosis not present

## 2021-11-20 DIAGNOSIS — R1312 Dysphagia, oropharyngeal phase: Secondary | ICD-10-CM | POA: Diagnosis not present

## 2021-11-20 DIAGNOSIS — Z9181 History of falling: Secondary | ICD-10-CM | POA: Diagnosis not present

## 2021-11-20 DIAGNOSIS — E079 Disorder of thyroid, unspecified: Secondary | ICD-10-CM | POA: Diagnosis not present

## 2021-11-20 DIAGNOSIS — N189 Chronic kidney disease, unspecified: Secondary | ICD-10-CM | POA: Diagnosis not present

## 2021-11-23 DIAGNOSIS — Z9181 History of falling: Secondary | ICD-10-CM | POA: Diagnosis not present

## 2021-11-23 DIAGNOSIS — R2681 Unsteadiness on feet: Secondary | ICD-10-CM | POA: Diagnosis not present

## 2021-11-23 DIAGNOSIS — R1312 Dysphagia, oropharyngeal phase: Secondary | ICD-10-CM | POA: Diagnosis not present

## 2021-11-23 DIAGNOSIS — N189 Chronic kidney disease, unspecified: Secondary | ICD-10-CM | POA: Diagnosis not present

## 2021-11-23 DIAGNOSIS — E079 Disorder of thyroid, unspecified: Secondary | ICD-10-CM | POA: Diagnosis not present

## 2021-11-23 DIAGNOSIS — S2242XD Multiple fractures of ribs, left side, subsequent encounter for fracture with routine healing: Secondary | ICD-10-CM | POA: Diagnosis not present

## 2021-11-24 DIAGNOSIS — N189 Chronic kidney disease, unspecified: Secondary | ICD-10-CM | POA: Diagnosis not present

## 2021-11-24 DIAGNOSIS — Z9181 History of falling: Secondary | ICD-10-CM | POA: Diagnosis not present

## 2021-11-24 DIAGNOSIS — E079 Disorder of thyroid, unspecified: Secondary | ICD-10-CM | POA: Diagnosis not present

## 2021-11-24 DIAGNOSIS — R1312 Dysphagia, oropharyngeal phase: Secondary | ICD-10-CM | POA: Diagnosis not present

## 2021-11-24 DIAGNOSIS — S2242XD Multiple fractures of ribs, left side, subsequent encounter for fracture with routine healing: Secondary | ICD-10-CM | POA: Diagnosis not present

## 2021-11-24 DIAGNOSIS — R2681 Unsteadiness on feet: Secondary | ICD-10-CM | POA: Diagnosis not present

## 2021-11-26 DIAGNOSIS — N189 Chronic kidney disease, unspecified: Secondary | ICD-10-CM | POA: Diagnosis not present

## 2021-11-26 DIAGNOSIS — S2242XD Multiple fractures of ribs, left side, subsequent encounter for fracture with routine healing: Secondary | ICD-10-CM | POA: Diagnosis not present

## 2021-11-26 DIAGNOSIS — R2681 Unsteadiness on feet: Secondary | ICD-10-CM | POA: Diagnosis not present

## 2021-11-26 DIAGNOSIS — E079 Disorder of thyroid, unspecified: Secondary | ICD-10-CM | POA: Diagnosis not present

## 2021-11-26 DIAGNOSIS — R1312 Dysphagia, oropharyngeal phase: Secondary | ICD-10-CM | POA: Diagnosis not present

## 2021-11-26 DIAGNOSIS — Z9181 History of falling: Secondary | ICD-10-CM | POA: Diagnosis not present

## 2021-11-27 DIAGNOSIS — N189 Chronic kidney disease, unspecified: Secondary | ICD-10-CM | POA: Diagnosis not present

## 2021-11-27 DIAGNOSIS — E079 Disorder of thyroid, unspecified: Secondary | ICD-10-CM | POA: Diagnosis not present

## 2021-11-27 DIAGNOSIS — R1312 Dysphagia, oropharyngeal phase: Secondary | ICD-10-CM | POA: Diagnosis not present

## 2021-11-27 DIAGNOSIS — R2681 Unsteadiness on feet: Secondary | ICD-10-CM | POA: Diagnosis not present

## 2021-11-27 DIAGNOSIS — S2242XD Multiple fractures of ribs, left side, subsequent encounter for fracture with routine healing: Secondary | ICD-10-CM | POA: Diagnosis not present

## 2021-11-27 DIAGNOSIS — Z9181 History of falling: Secondary | ICD-10-CM | POA: Diagnosis not present

## 2021-12-02 DIAGNOSIS — N189 Chronic kidney disease, unspecified: Secondary | ICD-10-CM | POA: Diagnosis not present

## 2021-12-02 DIAGNOSIS — R1312 Dysphagia, oropharyngeal phase: Secondary | ICD-10-CM | POA: Diagnosis not present

## 2021-12-02 DIAGNOSIS — S2242XD Multiple fractures of ribs, left side, subsequent encounter for fracture with routine healing: Secondary | ICD-10-CM | POA: Diagnosis not present

## 2021-12-02 DIAGNOSIS — Z9181 History of falling: Secondary | ICD-10-CM | POA: Diagnosis not present

## 2021-12-02 DIAGNOSIS — E079 Disorder of thyroid, unspecified: Secondary | ICD-10-CM | POA: Diagnosis not present

## 2021-12-02 DIAGNOSIS — R2681 Unsteadiness on feet: Secondary | ICD-10-CM | POA: Diagnosis not present

## 2021-12-03 DIAGNOSIS — E079 Disorder of thyroid, unspecified: Secondary | ICD-10-CM | POA: Diagnosis not present

## 2021-12-03 DIAGNOSIS — R2681 Unsteadiness on feet: Secondary | ICD-10-CM | POA: Diagnosis not present

## 2021-12-03 DIAGNOSIS — N189 Chronic kidney disease, unspecified: Secondary | ICD-10-CM | POA: Diagnosis not present

## 2021-12-03 DIAGNOSIS — S2242XD Multiple fractures of ribs, left side, subsequent encounter for fracture with routine healing: Secondary | ICD-10-CM | POA: Diagnosis not present

## 2021-12-03 DIAGNOSIS — Z9181 History of falling: Secondary | ICD-10-CM | POA: Diagnosis not present

## 2021-12-03 DIAGNOSIS — R1312 Dysphagia, oropharyngeal phase: Secondary | ICD-10-CM | POA: Diagnosis not present

## 2022-07-22 ENCOUNTER — Encounter: Payer: Self-pay | Admitting: Hematology & Oncology

## 2022-07-22 ENCOUNTER — Other Ambulatory Visit: Payer: Self-pay

## 2022-07-22 ENCOUNTER — Encounter (HOSPITAL_COMMUNITY): Payer: Self-pay

## 2022-07-22 ENCOUNTER — Emergency Department (HOSPITAL_COMMUNITY)
Admission: EM | Admit: 2022-07-22 | Discharge: 2022-07-23 | Disposition: A | Discharge status: Home / Self Care | Payer: Medicare Other | Attending: Emergency Medicine | Admitting: Emergency Medicine

## 2022-07-22 ENCOUNTER — Emergency Department (HOSPITAL_COMMUNITY): Payer: Medicare Other

## 2022-07-22 DIAGNOSIS — W19XXXA Unspecified fall, initial encounter: Secondary | ICD-10-CM

## 2022-07-22 DIAGNOSIS — S0990XA Unspecified injury of head, initial encounter: Secondary | ICD-10-CM | POA: Diagnosis present

## 2022-07-22 DIAGNOSIS — Z79899 Other long term (current) drug therapy: Secondary | ICD-10-CM | POA: Diagnosis not present

## 2022-07-22 DIAGNOSIS — I10 Essential (primary) hypertension: Secondary | ICD-10-CM | POA: Diagnosis not present

## 2022-07-22 DIAGNOSIS — W050XXA Fall from non-moving wheelchair, initial encounter: Secondary | ICD-10-CM | POA: Diagnosis not present

## 2022-07-22 DIAGNOSIS — R079 Chest pain, unspecified: Secondary | ICD-10-CM | POA: Insufficient documentation

## 2022-07-22 DIAGNOSIS — Z91040 Latex allergy status: Secondary | ICD-10-CM | POA: Insufficient documentation

## 2022-07-22 NOTE — ED Triage Notes (Signed)
Pt BIB EMS from [facility] for a fall. Pt was sitting in a wheelchair and fell. Pt c/o of left sided flank pain under her breast. Pt did not hit her head and not on blood thinners.  Has memory loss at baseline and hx of hypertension  178 palpated 82 hr 97%  116 cbg

## 2022-07-23 NOTE — ED Provider Notes (Signed)
[facility]  HOSPITAL-EMERGENCY DEPT Provider Note   CSN: [id number] Arrival date & time: 07/22/22  2240     History  Chief Complaint  Patient presents with   Brittany Weber is a 88 y.o. female.  The history is provided by the patient, the EMS personnel and a relative.  Fall This is a new problem. The current episode started 1 to 2 hours ago. The problem occurs rarely. The problem has been resolved. Pertinent negatives include no abdominal pain. Nothing aggravates the symptoms. Nothing relieves the symptoms. She has tried nothing for the symptoms. The treatment provided no relief.       Home Medications Prior to Admission medications   Medication Sig Start Date End Date Taking? Authorizing Provider  acetaminophen (TYLENOL) 325 MG tablet Take 650 mg by mouth every 8 (eight) hours as needed for headache (pain).    [provider]  amLODipine (NORVASC) 2.5 MG tablet Take 2.5 mg by mouth daily. Hold for SBP <100    [provider]  Cholecalciferol (VITAMIN D-3) 25 MCG (1000 UT) CAPS Take 2,000 Units by mouth at bedtime.    [provider]  diclofenac Sodium (VOLTAREN) 1 % GEL Apply 2 g topically 4 (four) times daily. Patient taking differently: Apply 2 g topically 4 (four) times daily. Apply to hips 02/15/21   Cardama, Amadeo Garnet, MD  divalproex (DEPAKOTE SPRINKLE) 125 MG capsule Take 125 mg by mouth 2 (two) times daily.    [provider]  folic acid (FOLVITE) 1 MG tablet TAKE 1 TABLET BY MOUTH TWICE DAILY. Patient taking differently: Take 1 mg by mouth daily. 08/19/20   [name], MD  Menthol, Topical Analgesic, (BIOFREEZE) 4 % GEL Apply 1 application topically See admin instructions. Apply topically to left chest/upper abdomen/bilateral hips and legs twice daily; apply to right shoulder once daily    [provider]  Skin Protectants, Misc. (EUCERIN) cream Apply 1 application topically daily. Apply to feet     [provider]  vitamin C (ASCORBIC ACID) 500 MG tablet Take 1,000 mg by mouth daily.    [provider]      Allergies    Amoxicillin, Ampicillin, Aspirin, Clarithromycin, Codeine, Darvocet [propoxyphene n-acetaminophen], Demerol, Diazepam, Emetrol, Erythromycin, Indomethacin, Iron, Latex, Lisinopril, Meperidine, Oxycodone, Sulfa antibiotics, Sulfonamide derivatives, and Valium    Review of Systems   Review of Systems  Constitutional:  Negative for fever.  HENT:  Negative for facial swelling.   Respiratory:  Negative for wheezing and stridor.   Gastrointestinal:  Negative for abdominal pain.  All other systems reviewed and are negative.   Physical Exam Updated Vital Signs BP (!) 137/110   Pulse 77   Temp 98.1 F (36.7 C) (Oral)   Resp 18   Ht 5' (1.524 m)   Wt 66.2 kg   SpO2 100%   BMI 28.51 kg/m  Physical Exam Vitals and nursing note reviewed.  Constitutional:      General: She is not in acute distress.    Appearance: She is well-developed.  HENT:     Head: Normocephalic and atraumatic.     Right Ear: Tympanic membrane normal.     Left Ear: Tympanic membrane normal.     Nose: Nose normal.  Eyes:     Pupils: Pupils are equal, round, and reactive to light.  Cardiovascular:     Rate and Rhythm: Normal rate and regular rhythm.     Pulses: Normal pulses.  Heart sounds: Normal heart sounds.  Pulmonary:     Effort: Pulmonary effort is normal. No respiratory distress.     Breath sounds: Normal breath sounds.  Abdominal:     General: Bowel sounds are normal. There is no distension.     Palpations: Abdomen is soft.     Tenderness: There is no abdominal tenderness. There is no guarding or rebound.  Genitourinary:    Vagina: No vaginal discharge.  Musculoskeletal:        General: Normal range of motion.     Right wrist: No bony tenderness or snuff box tenderness.     Left wrist: No bony tenderness or snuff box tenderness.     Cervical back: Normal  and neck supple.     Right hip: Normal.     Left hip: Normal.     Right ankle: Normal.     Right Achilles Tendon: Normal.     Left ankle: Normal.     Left Achilles Tendon: Normal.     Right foot: Normal.     Left foot: Normal.  Skin:    General: Skin is dry.     Capillary Refill: Capillary refill takes less than 2 seconds.     Findings: No erythema or rash.  Neurological:     General: No focal deficit present.     Deep Tendon Reflexes: Reflexes normal.  Psychiatric:        Mood and Affect: Mood normal.     ED Results / Procedures / Treatments   Labs (all labs ordered are listed, but only abnormal results are displayed) Labs Reviewed - No data to display  EKG None  Radiology DG Ribs Unilateral W/Chest Left  Result Date: 07/22/2022 CLINICAL DATA:  Recent fall with left-sided chest pain, initial encounter EXAM: LEFT RIBS AND CHEST - 3+ VIEW COMPARISON:  02/15/2021 FINDINGS: Cardiac shadow is stable. Aortic calcifications are noted. Prominent right lobe of the thyroid and vascularity causes deviation of the trachea to the left stable in appearance from the prior exam. Healed left eighth and ninth rib fractures are noted. No new fracture deformity is noted. IMPRESSION: No acute fracture is noted. Old left eighth and ninth rib fractures with healing are seen. Electronically Signed   By: Alcide Clever M.D.   On: 07/22/2022 23:49   CT Head Wo Contrast  Result Date: 07/22/2022 CLINICAL DATA:  Polytrauma, blunt EXAM: CT HEAD WITHOUT CONTRAST CT CERVICAL SPINE WITHOUT CONTRAST TECHNIQUE: Multidetector CT imaging of the head and cervical spine was performed following the standard protocol without intravenous contrast. Multiplanar CT image reconstructions of the cervical spine were also generated. RADIATION DOSE REDUCTION: This exam was performed according to the departmental dose-optimization program which includes automated exposure control, adjustment of the mA and/or kV according to patient  size and/or use of iterative reconstruction technique. COMPARISON:  None Available. FINDINGS: CT HEAD FINDINGS BRAIN: BRAIN Cerebral ventricle sizes are concordant with the degree of cerebral volume loss. Patchy and confluent areas of decreased attenuation are noted throughout the deep and periventricular white matter of the cerebral hemispheres bilaterally, compatible with chronic microvascular ischemic disease. No evidence of large-territorial acute infarction. No parenchymal hemorrhage. No mass lesion. No extra-axial collection. No mass effect or midline shift. No hydrocephalus. Basilar cisterns are patent. Vascular: No hyperdense vessel. Skull: No acute fracture or focal lesion. Sinuses/Orbits: Paranasal sinuses and mastoid air cells are clear. Bilateral lens replacement. Otherwise the orbits are unremarkable. Other: None. CT CERVICAL SPINE FINDINGS Alignment: Normal. Skull base and  vertebrae: Degenerative changes of the spine with at least moderate osseous neural foraminal stenosis at the C4-C5. No acute fracture. No aggressive appearing focal osseous lesion or focal pathologic process. Soft tissues and spinal canal: No prevertebral fluid or swelling. No visible canal hematoma. Upper chest: Unremarkable. Other: Atherosclerotic plaque of the aortic arch that is partially visualized. Enlarged multinodular goiter. In the setting of significant comorbidities or limited life expectancy, no follow-up recommended (ref: J Am Coll Radiol. 2015 Feb;12(2): 143-50). IMPRESSION: 1. No acute intracranial abnormality. 2. No acute displaced fracture or traumatic listhesis of the cervical spine. 3.  Aortic Atherosclerosis (ICD10-I70.0). Electronically Signed   By: Tish Frederickson M.D.   On: 07/22/2022 23:49   CT Cervical Spine Wo Contrast  Result Date: 07/22/2022 CLINICAL DATA:  Polytrauma, blunt EXAM: CT HEAD WITHOUT CONTRAST CT CERVICAL SPINE WITHOUT CONTRAST TECHNIQUE: Multidetector CT imaging of the head and cervical  spine was performed following the standard protocol without intravenous contrast. Multiplanar CT image reconstructions of the cervical spine were also generated. RADIATION DOSE REDUCTION: This exam was performed according to the departmental dose-optimization program which includes automated exposure control, adjustment of the mA and/or kV according to patient size and/or use of iterative reconstruction technique. COMPARISON:  None Available. FINDINGS: CT HEAD FINDINGS BRAIN: BRAIN Cerebral ventricle sizes are concordant with the degree of cerebral volume loss. Patchy and confluent areas of decreased attenuation are noted throughout the deep and periventricular white matter of the cerebral hemispheres bilaterally, compatible with chronic microvascular ischemic disease. No evidence of large-territorial acute infarction. No parenchymal hemorrhage. No mass lesion. No extra-axial collection. No mass effect or midline shift. No hydrocephalus. Basilar cisterns are patent. Vascular: No hyperdense vessel. Skull: No acute fracture or focal lesion. Sinuses/Orbits: Paranasal sinuses and mastoid air cells are clear. Bilateral lens replacement. Otherwise the orbits are unremarkable. Other: None. CT CERVICAL SPINE FINDINGS Alignment: Normal. Skull base and vertebrae: Degenerative changes of the spine with at least moderate osseous neural foraminal stenosis at the C4-C5. No acute fracture. No aggressive appearing focal osseous lesion or focal pathologic process. Soft tissues and spinal canal: No prevertebral fluid or swelling. No visible canal hematoma. Upper chest: Unremarkable. Other: Atherosclerotic plaque of the aortic arch that is partially visualized. Enlarged multinodular goiter. In the setting of significant comorbidities or limited life expectancy, no follow-up recommended (ref: J Am Coll Radiol. 2015 Feb;12(2): 143-50). IMPRESSION: 1. No acute intracranial abnormality. 2. No acute displaced fracture or traumatic  listhesis of the cervical spine. 3.  Aortic Atherosclerosis (ICD10-I70.0). Electronically Signed   By: Tish Frederickson M.D.   On: 07/22/2022 23:49    Procedures Procedures    Medications Ordered in ED Medications - No data to display  ED Course/ Medical Decision Making/ A&P                           Medical Decision Making Wheelchair broke at [facility] home   Amount and/or Complexity of Data Reviewed External Data Reviewed: notes.    Details: previous notes reviewed Radiology: ordered. ECG/medicine tests: ordered and independent interpretation performed.    Details: no C spine fractures, no ICH on CT by me   Final Clinical Impression(s) / ED Diagnoses Final diagnoses:  Fall, initial encounter   Return for intractable cough, coughing up blood, fevers > 100.4 unrelieved by medication, shortness of breath, intractable vomiting, chest pain, shortness of breath, weakness, numbness, changes in speech, facial asymmetry, abdominal pain, passing out, Inability to tolerate liquids  or food, cough, altered mental status or any concerns. No signs of systemic illness or infection. The patient is nontoxic-appearing on exam and vital signs are within normal limits.  I have reviewed the triage vital signs and the nursing notes. Pertinent labs & imaging results that were available during my care of the patient were reviewed by me and considered in my medical decision making (see chart for details). After history, exam, and medical workup I feel the patient has been appropriately medically screened and is safe for discharge home. Pertinent diagnoses were discussed with the patient. Patient was given return precautions.    Rx / DC Orders ED Discharge Orders     None         Oumou Smead, MD 07/23/22 0003

## 2023-04-18 ENCOUNTER — Emergency Department (HOSPITAL_COMMUNITY): Payer: Medicare Other

## 2023-04-18 ENCOUNTER — Emergency Department (HOSPITAL_COMMUNITY)
Admission: EM | Admit: 2023-04-18 | Discharge: 2023-04-19 | Disposition: A | Discharge status: Skilled Nursing Facility | Payer: Medicare Other | Attending: Student | Admitting: Student

## 2023-04-18 ENCOUNTER — Other Ambulatory Visit: Payer: Self-pay

## 2023-04-18 DIAGNOSIS — F0390 Unspecified dementia without behavioral disturbance: Secondary | ICD-10-CM | POA: Diagnosis not present

## 2023-04-18 DIAGNOSIS — I10 Essential (primary) hypertension: Secondary | ICD-10-CM | POA: Diagnosis not present

## 2023-04-18 DIAGNOSIS — Z91040 Latex allergy status: Secondary | ICD-10-CM | POA: Insufficient documentation

## 2023-04-18 DIAGNOSIS — J1282 Pneumonia due to coronavirus disease 2019: Secondary | ICD-10-CM | POA: Diagnosis not present

## 2023-04-18 DIAGNOSIS — U071 COVID-19: Secondary | ICD-10-CM | POA: Insufficient documentation

## 2023-04-18 DIAGNOSIS — J189 Pneumonia, unspecified organism: Secondary | ICD-10-CM

## 2023-04-18 LAB — CBC WITH DIFFERENTIAL/PLATELET
Abs Immature Granulocytes: 0.02 10*3/uL (ref 0.00–0.07)
Basophils Absolute: 0 10*3/uL (ref 0.0–0.1)
Basophils Relative: 1 %
Eosinophils Absolute: 0 10*3/uL (ref 0.0–0.5)
Eosinophils Relative: 1 %
HCT: 34.2 % — ABNORMAL LOW (ref 36.0–46.0)
Hemoglobin: 10.6 g/dL — ABNORMAL LOW (ref 12.0–15.0)
Immature Granulocytes: 1 %
Lymphocytes Relative: 30 %
Lymphs Abs: 1.3 10*3/uL (ref 0.7–4.0)
MCH: 29.9 pg (ref 26.0–34.0)
MCHC: 31 g/dL (ref 30.0–36.0)
MCV: 96.6 fL (ref 80.0–100.0)
Monocytes Absolute: 0.8 10*3/uL (ref 0.1–1.0)
Monocytes Relative: 19 %
Neutro Abs: 2.1 10*3/uL (ref 1.7–7.7)
Neutrophils Relative %: 48 %
Platelets: 103 10*3/uL — ABNORMAL LOW (ref 150–400)
RBC: 3.54 MIL/uL — ABNORMAL LOW (ref 3.87–5.11)
RDW: 14.6 % (ref 11.5–15.5)
WBC: 4.2 10*3/uL (ref 4.0–10.5)
nRBC: 0 % (ref 0.0–0.2)

## 2023-04-18 LAB — COMPREHENSIVE METABOLIC PANEL
ALT: 17 U/L (ref 0–44)
AST: 16 U/L (ref 15–41)
Albumin: 3.8 g/dL (ref 3.5–5.0)
Alkaline Phosphatase: 46 U/L (ref 38–126)
Anion gap: 11 (ref 5–15)
BUN: 23 mg/dL (ref 8–23)
CO2: 24 mmol/L (ref 22–32)
Calcium: 9 mg/dL (ref 8.9–10.3)
Chloride: 106 mmol/L (ref 98–111)
Creatinine, Ser: 1.11 mg/dL — ABNORMAL HIGH (ref 0.44–1.00)
GFR, Estimated: 48 mL/min — ABNORMAL LOW (ref >60)
Glucose, Bld: 103 mg/dL — ABNORMAL HIGH (ref 70–99)
Potassium: 3.6 mmol/L (ref 3.5–5.1)
Sodium: 141 mmol/L (ref 135–145)
Total Bilirubin: 0.5 mg/dL (ref 0.3–1.2)
Total Protein: 7.7 g/dL (ref 6.5–8.1)

## 2023-04-18 MED ORDER — SODIUM CHLORIDE 0.9 % IV SOLN
500.0000 mg | Freq: Once | INTRAVENOUS | Status: AC
  Administered 2023-04-18: 500 mg via INTRAVENOUS
  Filled 2023-04-18: qty 5

## 2023-04-18 MED ORDER — LORAZEPAM 2 MG/ML IJ SOLN
0.5000 mg | Freq: Once | INTRAMUSCULAR | Status: AC
  Administered 2023-04-18: 0.5 mg via INTRAVENOUS
  Filled 2023-04-18: qty 1

## 2023-04-18 MED ORDER — SODIUM CHLORIDE 0.9 % IV SOLN
2.0000 g | Freq: Once | INTRAVENOUS | Status: AC
  Administered 2023-04-18: 2 g via INTRAVENOUS
  Filled 2023-04-18: qty 20

## 2023-04-18 MED ORDER — IOHEXOL 300 MG/ML  SOLN
75.0000 mL | Freq: Once | INTRAMUSCULAR | Status: AC | PRN
  Administered 2023-04-18: 75 mL via INTRAVENOUS

## 2023-04-18 MED ORDER — AMOXICILLIN-POT CLAVULANATE 875-125 MG PO TABS: 1.0000 | ORAL_TABLET | Freq: Two times a day (BID) | ORAL | 0 refills | Status: DC

## 2023-04-18 MED ORDER — AMOXICILLIN-POT CLAVULANATE 875-125 MG PO TABS: 1.0000 | ORAL_TABLET | Freq: Two times a day (BID) | ORAL | 0 refills | Status: AC

## 2023-04-18 MED ORDER — AZITHROMYCIN 250 MG PO TABS: 250.0000 mg | ORAL_TABLET | Freq: Every day | ORAL | 0 refills | Status: DC

## 2023-04-18 MED ORDER — PAXLOVID (300/100) 20 X 150 MG & 10 X 100MG PO TBPK: 3.0000 | ORAL_TABLET | Freq: Two times a day (BID) | ORAL | 0 refills | Status: DC

## 2023-04-18 MED ORDER — PAXLOVID (300/100) 20 X 150 MG & 10 X 100MG PO TBPK: 3.0000 | ORAL_TABLET | Freq: Two times a day (BID) | ORAL | 0 refills | Status: AC

## 2023-04-18 MED ORDER — AZITHROMYCIN 250 MG PO TABS: 250.0000 mg | ORAL_TABLET | Freq: Every day | ORAL | 0 refills | Status: AC

## 2023-04-18 MED ORDER — KETOROLAC TROMETHAMINE 15 MG/ML IJ SOLN
15.0000 mg | Freq: Once | INTRAMUSCULAR | Status: AC
  Administered 2023-04-18: 15 mg via INTRAVENOUS
  Filled 2023-04-18: qty 1

## 2023-04-18 NOTE — ED Notes (Signed)
Patient transported to X-ray 

## 2023-04-18 NOTE — ED Provider Notes (Signed)
[facility] EMERGENCY DEPARTMENT AT [facility] Provider Note  CSN: [id number] Arrival date & time: 04/18/23 1638  Chief Complaint(s) Covid Positive  HPI Brittany Weber is a 89 y.o. female with PMH dementia, anemia, PVD, HTN who presents emergency department for evaluation of shortness of breath, lethargy and COVID-19.  History obtained from patient's family members who states that they received a call from the nursing facility yesterday that her oxygen saturations dropped to approximately 85% while sleeping.  They placed her on oxygen last night and tested her for COVID this morning.  She was found to be COVID-positive and family came to visit her this morning and found her to be more lethargic and difficult to awaken.  EMS was then called and patient brought to the emergency department for further evaluation.  Here in the emergency room, patient is alert and no somnolence is noted.  She is saturating 95% on room air.  Patient is frequently coughing of which the family says is new.  Additional history unable to obtain secondary to patient's dementia.   Past Medical History Past Medical History:  Diagnosis Date   Anemia of renal disease 10/22/2011   Anemia, iron deficiency 10/22/2011   Cataract    Patient Active Problem List   Diagnosis Date Noted   Pain due to onychomycosis of toenails of both feet 06/13/2019   Coagulation disorder (HCC) 06/13/2019   Glaucoma 12/25/2017   Peripheral vascular disease (HCC) 06/28/2015   Anemia of renal disease 10/22/2011   Anemia, iron deficiency 10/22/2011   DISORDER, THYROID NOS 07/20/2007   Pancytopenia (HCC) 07/20/2007   ANEMIA-NOS 07/20/2007   HYPERTENSION 07/20/2007   ALLERGIC RHINITIS 07/20/2007   TMJ SYNDROME 07/20/2007   DEGENERATIVE DISC DISEASE 07/20/2007   OSTEOPENIA 07/20/2007   Home Medication(s) Prior to Admission medications   Medication Sig Start Date End Date Taking? Authorizing Provider  acetaminophen (TYLENOL) 325  MG tablet Take 650 mg by mouth every 8 (eight) hours as needed for headache (pain).    [provider]  amLODipine (NORVASC) 2.5 MG tablet Take 2.5 mg by mouth daily. Hold for SBP <100    [provider]  Cholecalciferol (VITAMIN D-3) 25 MCG (1000 UT) CAPS Take 2,000 Units by mouth at bedtime.    [provider]  diclofenac Sodium (VOLTAREN) 1 % GEL Apply 2 g topically 4 (four) times daily. Patient taking differently: Apply 2 g topically 4 (four) times daily. Apply to hips 02/15/21   Cardama, Amadeo Garnet, MD  divalproex (DEPAKOTE SPRINKLE) 125 MG capsule Take 125 mg by mouth 2 (two) times daily.    [provider]  folic acid (FOLVITE) 1 MG tablet TAKE 1 TABLET BY MOUTH TWICE DAILY. Patient taking differently: Take 1 mg by mouth daily. 08/19/20   [name], MD  Menthol, Topical Analgesic, (BIOFREEZE) 4 % GEL Apply 1 application topically See admin instructions. Apply topically to left chest/upper abdomen/bilateral hips and legs twice daily; apply to right shoulder once daily    [provider]  Skin Protectants, Misc. (EUCERIN) cream Apply 1 application topically daily. Apply to feet    [provider]  vitamin C (ASCORBIC ACID) 500 MG tablet Take 1,000 mg by mouth daily.    [provider]  Past Surgical History Past Surgical History:  Procedure Laterality Date   ABDOMINAL HYSTERECTOMY     APPENDECTOMY     Family History Family History  Problem Relation Age of Onset   Diabetes Father    Diabetes Mother    Kidney disease Mother     Social History Social History   Tobacco Use   Smoking status: Never   Smokeless tobacco: Never   Tobacco comments:    never used tobacco  Vaping Use   Vaping Use: Never used  Substance Use Topics   Alcohol use: No    Alcohol/week: 0.0 standard drinks of  alcohol   Drug use: No   Allergies Amoxicillin, Ampicillin, Aspirin, Clarithromycin, Codeine, Darvocet [propoxyphene n-acetaminophen], Demerol, Diazepam, Emetrol, Erythromycin, Indomethacin, Iron, Latex, Lisinopril, Meperidine, Oxycodone, Sulfa antibiotics, Sulfonamide derivatives, and Valium  Review of Systems Review of Systems  Unable to perform ROS: Dementia    Physical Exam Vital Signs  I have reviewed the triage vital signs BP (!) 123/90 (BP Location: Left Arm)   Pulse 73   Temp 98.3 F (36.8 C) (Oral)   Resp 17   Ht 5' (1.524 m)   Wt 67 kg   SpO2 100%   BMI 28.85 kg/m   Physical Exam Vitals and nursing note reviewed.  Constitutional:      General: She is not in acute distress.    Appearance: She is well-developed.  HENT:     Head: Normocephalic and atraumatic.  Eyes:     Conjunctiva/sclera: Conjunctivae normal.  Cardiovascular:     Rate and Rhythm: Normal rate and regular rhythm.     Heart sounds: No murmur heard. Pulmonary:     Effort: Pulmonary effort is normal. No respiratory distress.     Breath sounds: Normal breath sounds.  Abdominal:     Palpations: Abdomen is soft.     Tenderness: There is no abdominal tenderness.  Musculoskeletal:        General: No swelling.     Cervical back: Neck supple.  Skin:    General: Skin is warm and dry.     Capillary Refill: Capillary refill takes less than 2 seconds.  Neurological:     Mental Status: She is alert.  Psychiatric:        Mood and Affect: Mood normal.     ED Results and Treatments Labs (all labs ordered are listed, but only abnormal results are displayed) Labs Reviewed  COMPREHENSIVE METABOLIC PANEL - Abnormal; Notable for the following components:      Result Value   Glucose, Bld 103 (*)    Creatinine, Ser 1.11 (*)    GFR, Estimated 48 (*)    All other components within normal limits  CBC WITH DIFFERENTIAL/PLATELET - Abnormal; Notable for the following components:   RBC 3.54 (*)    Hemoglobin  10.6 (*)    HCT 34.2 (*)    Platelets 103 (*)    All other components within normal limits  Radiology DG Chest 2 View  Result Date: 04/18/2023 CLINICAL DATA:  Dyspnea.  COVID-19 positive. EXAM: CHEST - 2 VIEW COMPARISON:  July 22, 2022 FINDINGS: The study is limited due to low lung volumes. The cardiomediastinal silhouette is stable. Stable prominence of the right paratracheal stripe. Apparent large right effusion based on the lateral view. Probable underlying atelectasis. No other interval changes or acute abnormalities. IMPRESSION: 1. Apparent large right effusion based on the lateral view. Probable underlying atelectasis. 2. No other interval changes or acute abnormalities. Electronically Signed   By: Gerome Sam III M.D.   On: 04/18/2023 17:44    Pertinent labs & imaging results that were available during my care of the patient were reviewed by me and considered in my medical decision making (see MDM for details).  Medications Ordered in ED Medications  ketorolac (TORADOL) 15 MG/ML injection 15 mg (15 mg Intravenous Given 04/18/23 1731)                                                                                                                                     Procedures Procedures  (including critical care time)  Medical Decision Making / ED Course   This patient presents to the ED for concern of cough, lethargy, this involves an extensive number of treatment options, and is a complaint that carries with it a high risk of complications and morbidity.  The differential diagnosis includes COVID-19, pneumonia, aspiration, electrolyte abnormality, bacteremia, sepsis  MDM: Patient seen emergency room for evaluation of cough and lethargy.  Physical exam is largely unremarkable and the patient is alert here and overall well-appearing.  No hypoxia seen in the  emergency department today and patient is saturating 97% on room air.  Laboratory evaluation with no significant leukocytosis, hemoglobin 10.6 which is patient's baseline, mild thrombocytopenia to 103.  Chest x-ray with an apparent large effusion, and follow-up CT chest does not show an effusion but does show atelectasis and bibasilar infiltrates that may represent infection versus aspiration.  We will cover with ceftriaxone azithromycin but at this time, patient is not in respiratory distress, is not requiring supplemental oxygen and is not more confused from her baseline and that she does not meet inpatient criteria.  I had a long discussion with the patient's family members and patient will be discharged on oral antibiotics and paxlovid with return precautions of which they voiced understanding.  Patient then discharged back to her facility.   Additional history obtained: -Additional history obtained from multiple family members -External records from outside source obtained and reviewed including: Chart review including previous notes, labs, imaging, consultation notes   Lab Tests: -I ordered, reviewed, and interpreted labs.   The pertinent results include:   Labs Reviewed  COMPREHENSIVE METABOLIC PANEL - Abnormal; Notable for the following components:      Result Value   Glucose, Bld 103 (*)    Creatinine, Ser 1.11 (*)  GFR, Estimated 48 (*)    All other components within normal limits  CBC WITH DIFFERENTIAL/PLATELET - Abnormal; Notable for the following components:   RBC 3.54 (*)    Hemoglobin 10.6 (*)    HCT 34.2 (*)    Platelets 103 (*)    All other components within normal limits       Imaging Studies ordered: I ordered imaging studies including chest x-ray, CT chest I independently visualized and interpreted imaging. I agree with the radiologist interpretation   Medicines ordered and prescription drug management: Meds ordered this encounter  Medications   ketorolac  (TORADOL) 15 MG/ML injection 15 mg    -I have reviewed the patients home medicines and have made adjustments as needed  Critical interventions none Cardiac Monitoring: The patient was maintained on a cardiac monitor.  I personally viewed and interpreted the cardiac monitored which showed an underlying rhythm of: NSR  Social Determinants of Health:  Factors impacting patients care include: Lives in skilled nursing facility   Reevaluation: After the interventions noted above, I reevaluated the patient and found that they have :improved  Co morbidities that complicate the patient evaluation  Past Medical History:  Diagnosis Date   Anemia of renal disease 10/22/2011   Anemia, iron deficiency 10/22/2011   Cataract       Dispostion: I considered admission for this patient, but at this time she does not meet inpatient criteria for admission she is safe for discharge with outpatient follow-up     Final Clinical Impression(s) / ED Diagnoses Final diagnoses:  None     @PCDICTATION @    Glendora Score, MD 04/18/23 2251

## 2023-04-18 NOTE — ED Triage Notes (Signed)
Patient brought in by EMS from [facility] per family request due to patient being COVID (+).  117/56 64 100% RA

## 2023-04-18 NOTE — ED Notes (Signed)
[facility] transport setup for pt 

## 2023-04-18 NOTE — ED Notes (Signed)
[facility] called x3 for report. No one answering

## 2024-01-11 NOTE — Progress Notes (Signed)
 This encounter was created in error - please disregard.
